# Patient Record
Sex: Male | Born: 1935 | Race: White | Hispanic: No | Marital: Married | Smoking: Former smoker
Health system: Southern US, Community
[De-identification: ages and names within clinical notes are randomized; demographics above are authoritative.]

## PROBLEM LIST (undated history)

## (undated) DIAGNOSIS — I951 Orthostatic hypotension: Secondary | ICD-10-CM

## (undated) DIAGNOSIS — M7550 Bursitis of unspecified shoulder: Secondary | ICD-10-CM

## (undated) DIAGNOSIS — G44009 Cluster headache syndrome, unspecified, not intractable: Secondary | ICD-10-CM

## (undated) DIAGNOSIS — R42 Dizziness and giddiness: Secondary | ICD-10-CM

## (undated) DIAGNOSIS — D649 Anemia, unspecified: Secondary | ICD-10-CM

## (undated) DIAGNOSIS — R32 Unspecified urinary incontinence: Secondary | ICD-10-CM

## (undated) DIAGNOSIS — I515 Myocardial degeneration: Secondary | ICD-10-CM

## (undated) DIAGNOSIS — I2510 Atherosclerotic heart disease of native coronary artery without angina pectoris: Secondary | ICD-10-CM

## (undated) DIAGNOSIS — M1990 Unspecified osteoarthritis, unspecified site: Secondary | ICD-10-CM

## (undated) DIAGNOSIS — I209 Angina pectoris, unspecified: Secondary | ICD-10-CM

## (undated) DIAGNOSIS — R51 Headache: Secondary | ICD-10-CM

## (undated) DIAGNOSIS — Z87440 Personal history of urinary (tract) infections: Secondary | ICD-10-CM

## (undated) DIAGNOSIS — I714 Abdominal aortic aneurysm, without rupture, unspecified: Secondary | ICD-10-CM

## (undated) DIAGNOSIS — I6529 Occlusion and stenosis of unspecified carotid artery: Secondary | ICD-10-CM

## (undated) DIAGNOSIS — H81399 Other peripheral vertigo, unspecified ear: Secondary | ICD-10-CM

## (undated) DIAGNOSIS — I10 Essential (primary) hypertension: Secondary | ICD-10-CM

## (undated) DIAGNOSIS — I639 Cerebral infarction, unspecified: Secondary | ICD-10-CM

## (undated) HISTORY — PX: HERNIA REPAIR: SHX51

## (undated) HISTORY — PX: CAROTID ENDARTERECTOMY: SUR193

## (undated) HISTORY — DX: Occlusion and stenosis of unspecified carotid artery: I65.29

## (undated) HISTORY — PX: FRACTURE SURGERY: SHX138

## (undated) HISTORY — PX: TONSILLECTOMY: SUR1361

## (undated) HISTORY — PX: LIVER BIOPSY: SHX301

---

## 2000-07-29 ENCOUNTER — Encounter: Payer: Self-pay | Admitting: Rheumatology

## 2000-07-29 ENCOUNTER — Encounter: Admission: RE | Admit: 2000-07-29 | Discharge: 2000-07-29 | Payer: Self-pay | Admitting: Rheumatology

## 2000-08-05 ENCOUNTER — Encounter: Payer: Self-pay | Admitting: Rheumatology

## 2000-08-05 ENCOUNTER — Encounter: Admission: RE | Admit: 2000-08-05 | Discharge: 2000-08-05 | Payer: Self-pay | Admitting: Rheumatology

## 2001-04-24 ENCOUNTER — Encounter: Admission: RE | Admit: 2001-04-24 | Discharge: 2001-04-24 | Payer: Self-pay | Admitting: Rheumatology

## 2001-04-24 ENCOUNTER — Encounter: Payer: Self-pay | Admitting: Rheumatology

## 2002-10-20 ENCOUNTER — Encounter: Payer: Self-pay | Admitting: General Surgery

## 2002-10-25 ENCOUNTER — Ambulatory Visit (HOSPITAL_COMMUNITY): Admission: RE | Admit: 2002-10-25 | Discharge: 2002-10-26 | Payer: Self-pay | Admitting: General Surgery

## 2003-01-14 ENCOUNTER — Encounter: Payer: Self-pay | Admitting: Emergency Medicine

## 2003-01-14 ENCOUNTER — Inpatient Hospital Stay (HOSPITAL_COMMUNITY): Admission: EM | Admit: 2003-01-14 | Discharge: 2003-01-15 | Payer: Self-pay | Admitting: Emergency Medicine

## 2004-08-07 ENCOUNTER — Ambulatory Visit: Payer: Self-pay | Admitting: Physical Medicine & Rehabilitation

## 2004-08-07 ENCOUNTER — Inpatient Hospital Stay (HOSPITAL_COMMUNITY): Admission: EM | Admit: 2004-08-07 | Discharge: 2004-08-14 | Payer: Self-pay | Admitting: Emergency Medicine

## 2004-08-10 ENCOUNTER — Encounter (INDEPENDENT_AMBULATORY_CARE_PROVIDER_SITE_OTHER): Payer: Self-pay | Admitting: *Deleted

## 2004-08-14 ENCOUNTER — Inpatient Hospital Stay (HOSPITAL_COMMUNITY)
Admission: RE | Admit: 2004-08-14 | Discharge: 2004-08-24 | Payer: Self-pay | Admitting: Physical Medicine & Rehabilitation

## 2004-08-30 ENCOUNTER — Encounter
Admission: RE | Admit: 2004-08-30 | Discharge: 2004-11-22 | Payer: Self-pay | Admitting: Physical Medicine & Rehabilitation

## 2004-10-05 ENCOUNTER — Encounter
Admission: RE | Admit: 2004-10-05 | Discharge: 2005-01-03 | Payer: Self-pay | Admitting: Physical Medicine & Rehabilitation

## 2004-10-09 ENCOUNTER — Ambulatory Visit: Payer: Self-pay | Admitting: Physical Medicine & Rehabilitation

## 2004-12-25 ENCOUNTER — Inpatient Hospital Stay (HOSPITAL_COMMUNITY): Admission: EM | Admit: 2004-12-25 | Discharge: 2005-01-01 | Payer: Self-pay | Admitting: Emergency Medicine

## 2004-12-25 ENCOUNTER — Ambulatory Visit: Payer: Self-pay | Admitting: Physical Medicine & Rehabilitation

## 2004-12-27 ENCOUNTER — Ambulatory Visit: Payer: Self-pay | Admitting: Cardiovascular Disease

## 2005-01-01 ENCOUNTER — Inpatient Hospital Stay: Admission: RE | Admit: 2005-01-01 | Discharge: 2005-01-09 | Payer: Self-pay | Admitting: *Deleted

## 2005-01-14 ENCOUNTER — Encounter
Admission: RE | Admit: 2005-01-14 | Discharge: 2005-03-18 | Payer: Self-pay | Admitting: Physical Medicine & Rehabilitation

## 2005-02-08 ENCOUNTER — Encounter
Admission: RE | Admit: 2005-02-08 | Discharge: 2005-05-09 | Payer: Self-pay | Admitting: Physical Medicine & Rehabilitation

## 2005-02-08 ENCOUNTER — Ambulatory Visit: Payer: Self-pay | Admitting: Physical Medicine & Rehabilitation

## 2005-03-15 ENCOUNTER — Ambulatory Visit: Payer: Self-pay | Admitting: Physical Medicine & Rehabilitation

## 2005-04-30 ENCOUNTER — Ambulatory Visit (HOSPITAL_COMMUNITY): Admission: RE | Admit: 2005-04-30 | Discharge: 2005-04-30 | Payer: Self-pay | Admitting: *Deleted

## 2006-08-27 ENCOUNTER — Encounter: Admission: RE | Admit: 2006-08-27 | Discharge: 2006-09-24 | Payer: Self-pay | Admitting: Neurology

## 2006-10-02 ENCOUNTER — Ambulatory Visit: Payer: Self-pay | Admitting: *Deleted

## 2006-11-04 ENCOUNTER — Encounter: Admission: RE | Admit: 2006-11-04 | Discharge: 2007-02-02 | Payer: Self-pay | Admitting: *Deleted

## 2007-03-17 ENCOUNTER — Ambulatory Visit: Payer: Self-pay | Admitting: Vascular Surgery

## 2007-03-31 ENCOUNTER — Ambulatory Visit: Payer: Self-pay | Admitting: Vascular Surgery

## 2007-04-06 ENCOUNTER — Encounter: Admission: RE | Admit: 2007-04-06 | Discharge: 2007-07-05 | Payer: Self-pay | Admitting: Neurology

## 2007-07-30 ENCOUNTER — Encounter: Admission: RE | Admit: 2007-07-30 | Discharge: 2007-10-28 | Payer: Self-pay | Admitting: Neurology

## 2007-10-20 ENCOUNTER — Ambulatory Visit: Payer: Self-pay | Admitting: Vascular Surgery

## 2008-10-18 ENCOUNTER — Ambulatory Visit: Payer: Self-pay | Admitting: Vascular Surgery

## 2009-03-21 ENCOUNTER — Encounter: Admission: RE | Admit: 2009-03-21 | Discharge: 2009-05-18 | Payer: Self-pay | Admitting: Orthopaedic Surgery

## 2009-05-02 ENCOUNTER — Encounter: Admission: RE | Admit: 2009-05-02 | Discharge: 2009-05-02 | Payer: Self-pay | Admitting: Neurology

## 2009-05-29 ENCOUNTER — Ambulatory Visit: Payer: Self-pay | Admitting: Vascular Surgery

## 2009-12-22 ENCOUNTER — Ambulatory Visit: Payer: Self-pay | Admitting: Vascular Surgery

## 2010-07-01 ENCOUNTER — Encounter: Payer: Self-pay | Admitting: Neurology

## 2010-07-06 ENCOUNTER — Encounter
Admission: RE | Admit: 2010-07-06 | Discharge: 2010-07-06 | Payer: Self-pay | Source: Home / Self Care | Attending: Rheumatology | Admitting: Rheumatology

## 2010-09-12 ENCOUNTER — Other Ambulatory Visit: Payer: Self-pay | Admitting: Rheumatology

## 2010-09-12 DIAGNOSIS — M25561 Pain in right knee: Secondary | ICD-10-CM

## 2010-09-12 DIAGNOSIS — R609 Edema, unspecified: Secondary | ICD-10-CM

## 2010-09-19 ENCOUNTER — Ambulatory Visit
Admission: RE | Admit: 2010-09-19 | Discharge: 2010-09-19 | Disposition: A | Discharge status: Home / Self Care | Payer: Self-pay | Source: Ambulatory Visit | Attending: Rheumatology | Admitting: Rheumatology

## 2010-09-19 DIAGNOSIS — M25561 Pain in right knee: Secondary | ICD-10-CM

## 2010-09-19 DIAGNOSIS — R609 Edema, unspecified: Secondary | ICD-10-CM

## 2010-10-23 NOTE — Procedures (Signed)
CAROTID DUPLEX EXAM   INDICATION:  Followup carotid disease.   HISTORY:  Diabetes:  No  Cardiac:  No  Hypertension:  Yes  Smoking:  Previous  Previous Surgery:  Right carotid endarterectomy.  CV History:  Previous stroke  Amaurosis Fugax No, Paresthesias No, Hemiparesis Yes                                       RIGHT                LEFT  Brachial systolic pressure:         180                  165  Brachial Doppler waveforms:         Triphasic            Triphasic  Vertebral direction of flow:        Antegrade/diminished.                    Antegrade/diminished.  DUPLEX VELOCITIES (cm/sec)  CCA peak systolic                   73                   58  ECA peak systolic                   140                  124  ICA peak systolic                   49                   Occluded  ICA end diastolic                   17                   Occluded  PLAQUE MORPHOLOGY:                  Intimal thickening   Mixed  PLAQUE AMOUNT:                      Mild                 Severe  PLAQUE LOCATION:                    CCA/ICA/ECA          ICA/ECA   IMPRESSION:  1. Patent right internal carotid artery with no evidence of      restenosis.  2. Right external carotid artery stenosis.  3. Known left internal carotid artery occlusion.        ___________________________________________  Quita Skye Hart Rochester, M.D.   CJ/MEDQ  D:  05/29/2009  T:  05/30/2009  Job:  [id number]

## 2010-10-23 NOTE — Procedures (Signed)
CAROTID DUPLEX EXAM   INDICATION:  Follow up known carotid artery disease.   HISTORY:  Diabetes:  No.  Cardiac:  No.  Hypertension:  Yes.  Smoking:  Quit three years ago (having smoked one pack per day for 50  years).  Previous Surgery:  Right carotid end arterectomy with PDA on 04/19/1984  by Dr. Hart Rochester.  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  Amaurosis Fugax No, Paresthesias No, Hemiparesis Yes, residual from CVA                                       RIGHT             LEFT  Brachial systolic pressure:         130               130  Brachial Doppler waveforms:         Biphasic          Biphasic  Vertebral direction of flow:        Antegrade         Antegrade  DUPLEX VELOCITIES (cm/sec)  CCA peak systolic                   64                81  ECA peak systolic                   75                85  ICA peak systolic                   58                Occluded  ICA end diastolic                   11  PLAQUE MORPHOLOGY:                  Mixed             Calcified  PLAQUE AMOUNT:                      Mild              Large  PLAQUE LOCATION:                    ICA               ICA   IMPRESSION:  1. Known left internal carotid artery occlusion.  2. 20-39% right internal carotid artery stenosis, status post      endarterectomy.  3. Study essentially unchanged from 03/25/2006.   ___________________________________________  [name] [name]. Hart Rochester, M.D.   DP/MEDQ  D:  03/17/2007  T:  03/17/2007  Job:  [id number]

## 2010-10-23 NOTE — Assessment & Plan Note (Signed)
OFFICE VISIT   TENNYSON, WACHA  DOB:  [birth year]                                       10/20/2007  [id number]#:[id number]   The patient returns today for followup regarding his abdominal aortic  aneurysm and his carotid occlusive disease.  He has a remote history of  a left brain stroke and has a known left internal carotid occlusion.  His aneurysm is less than 4 cm in diameter.  He does see Dr. Melbourne Abts  periodically for dizzy spells and falling, and had 1 second stroke in  2006 with the original one being in 1995 at which time he underwent a  right carotid endarterectomy.  Denies any active cardiac symptoms.  He  does have ventricular hypertrophy by echo.   PHYSICAL EXAM:  Blood pressure 184/90, heart rate 74, respirations are  18.  Carotid pulses are 3+.  No bruits are heard.  Neurologic exam  reveals aphasia and right hemiparesis.  Abdomen:  Soft and nontender  with no masses.  He has 3+ femoral pulses bilaterally.   Duplex scan reveals aneurysm to be unchanged less 4 cm in diameter and  his right carotid artery is widely patent with an occlusion of left  internal carotid.   I have reassured him regarding these findings.  He will return in 1 year  for protocol followup of the aneurysm and his carotid disease, unless he  develops any symptoms in the interim.  Please send a copy of that to Dr.  Renae Fickle:  See away she [facility] [phone] breast is 34 of  three lab of a dry as well.  She will let us   Quita Skye. Hart Rochester, M.D.  Electronically Signed   JDL/MEDQ  D:  10/20/2007  T:  10/21/2007  Job:  [id number]   cc:   Dr. Arnell Asal  Evie Lacks, MD

## 2010-10-23 NOTE — Procedures (Signed)
DUPLEX ULTRASOUND OF ABDOMINAL AORTA   INDICATION:  Followup of an abdominal aortic aneurysm.   HISTORY:  Diabetes:  no  Cardiac:  no  Hypertension:  yes  Smoking:  no  Family History:  no  Previous Surgery:  No   DUPLEX EXAM:         AP (cm)                   TRANSVERSE (cm)  Proximal             1.71 cm                   1.77 cm  Mid                  3.87 cm                   4.26 cm  Distal               1.95 cm                   1.95 cm  Right Iliac          1.53 cm                   0.77 cm  Left Iliac           1.30 cm                   0.80 cm   PREVIOUS:  Date: 10/18/2008  AP:  3.8  TRANSVERSE:  4.01   IMPRESSION:  1. Abdominal aortic aneurysm with largest measurement of 3.87 x 4.26      cm.  2. Appears stable with previous study.  3. Mural thrombus is also noted.   ___________________________________________  Quita Skye Hart Rochester, M.D.   CJ/MEDQ  D:  05/29/2009  T:  05/30/2009  Job:  [id number]

## 2010-10-23 NOTE — Procedures (Signed)
DUPLEX ULTRASOUND OF ABDOMINAL AORTA   INDICATION:  Abdominal aortic aneurysm   HISTORY:  Diabetes:  no  Cardiac:  no  Hypertension:  no  Smoking:  no  Connective Tissue Disorder:  no  Family History:  no  Previous Surgery:  No    DUPLEX EXAM:         AP (cm)                   TRANSVERSE (cm)  Proximal             1.8 cm                    1.8 cm  Mid                  3.8 cm                    4.0 cm  Distal               2.6 cm                    2.8 cm  Right Iliac          cm                        1.3 cm  Left Iliac           cm                        1/.7 cm   PREVIOUS:  Date: 05/29/2009  AP:  3.8  TRANSVERSE:  4.2    IMPRESSION:  Stable aneurysmal dilatation of the mid abdominal aorta  noted when compared to the previous exam.  Mural thrombus noted in the  abdominal aorta.          ___________________________________________  Quita Skye Hart Rochester, M.D.   CH/MEDQ  D:  12/25/2009  T:  12/25/2009  Job:  [id number]

## 2010-10-23 NOTE — Procedures (Signed)
CAROTID DUPLEX EXAM   INDICATION:  Followup of known carotid artery disease.   HISTORY:  Diabetes:  No  Cardiac:  No  Hypertension:  Yes  Smoking:  Quit 5 years ago  Previous Surgery:  Right carotid endarterectomy.  CV History:  Yes  Amaurosis Fugax No, Paresthesias No, Hemiparesis Yes                                       RIGHT             LEFT  Brachial systolic pressure:                           154  Brachial Doppler waveforms:                           biphasic  Vertebral direction of flow:        antegrade         antegrade  DUPLEX VELOCITIES (cm/sec)  CCA peak systolic                   84                81  ECA peak systolic                   117               126  ICA peak systolic                   61                occluded  ICA end diastolic                   22                occluded  PLAQUE MORPHOLOGY:                  none              heterogenous  PLAQUE AMOUNT:                      none              severe  PLAQUE LOCATION:                    none              BIF, ICA   IMPRESSION:  1. Patent right internal carotid artery with no evidence of      restenosis.  2. Occluded left internal carotid artery.       ___________________________________________  Quita Skye Hart Rochester, M.D.   AC/MEDQ  D:  10/18/2008  T:  10/18/2008  Job:  [id number]

## 2010-10-23 NOTE — Procedures (Signed)
DUPLEX ULTRASOUND OF ABDOMINAL AORTA   INDICATION:  Follow up abdominal aortic aneurysm.   HISTORY:  Diabetes:  No.  Cardiac:  No.  Hypertension:  Yes.  Smoking:  Quit three years ago (after having smoked one pack per day for  50 years).  Connective Tissue Disorder:  Family History:  Previous Surgery:  Right carotid end arterectomy with DPA on 04/19/1984  by Dr. Hart Rochester.  Patient is also status post right body CVA in 1995 with  residual.   DUPLEX EXAM:         AP (cm)                   TRANSVERSE (cm)  Proximal             1.77 Cm                   2.22 cm  Mid                  3.74 cm                   3.71 cm  Distal               2.46 cm                   2.51 cm  Right Iliac          0.98 cm  Left Iliac           0.95 cm   PREVIOUS:  Date: 03/25/2006  AP:  3.55  TRANSVERSE:  3.85   IMPRESSION:  1. Stable measurements of known abdominal aortic aneurysm.  2. Intimal flap from aortic dissection as noted on previous      examinations in the past.   ___________________________________________  Quita Skye. Hart Rochester, M.D.   DP/MEDQ  D:  03/17/2007  T:  03/17/2007  Job:  [id number]

## 2010-10-23 NOTE — Procedures (Signed)
CAROTID DUPLEX EXAM   INDICATION:  Follow up known carotid artery disease.   HISTORY:  Diabetes:  No.  Cardiac:  No.  Hypertension:  Yes.  Smoking:  Quit 4 years ago.  Previous Surgery:  Right carotid endarterectomy.  CV History:  CVA.  Amaurosis Fugax No, Paresthesias No, Hemiparesis Yes                                       RIGHT             LEFT  Brachial systolic pressure:         CVA               150  Brachial Doppler waveforms:                           Biphasic  Vertebral direction of flow:        Antegrade         Antegrade  DUPLEX VELOCITIES (cm/sec)  CCA peak systolic                   75                62  ECA peak systolic                   106               89  ICA peak systolic                   46                Occluded  ICA end diastolic                   15                Occluded  PLAQUE MORPHOLOGY:                  None              Heterogeneous  PLAQUE AMOUNT:                      None              Severe  PLAQUE LOCATION:                    None              ICA and ECA   IMPRESSION:  1. Occluded left ICA.  2. Normal carotid duplex noted in the right ICA.  3. Status post right carotid endarterectomy.  4. Antegrade bilateral vertebral arteries.   ___________________________________________  Quita Skye Hart Rochester, M.D.   MG/MEDQ  D:  10/20/2007  T:  10/20/2007  Job:  [id number]

## 2010-10-23 NOTE — Procedures (Signed)
CAROTID DUPLEX EXAM   INDICATION:  Follow up carotid disease.   HISTORY:  Diabetes:  no  Cardiac:  no  Hypertension:  no  Smoking:  no  Previous Surgery:  Right carotid endarterectomy.  CV History:  History of CVA x2.  Amaurosis Fugax No, Paresthesias No, Hemiparesis No                                       RIGHT             LEFT  Brachial systolic pressure:         172               162  Brachial Doppler waveforms:         normal  Vertebral direction of flow:        Antegrade  DUPLEX VELOCITIES (cm/sec)  CCA peak systolic                   79  ECA peak systolic                   123  ICA peak systolic                   38  ICA end diastolic                   12.  PLAQUE MORPHOLOGY:                  Heterogenous  PLAQUE AMOUNT:                      Minimal  PLAQUE LOCATION:                    ICA/CCA.   IMPRESSION:  1. Patent right carotid endarterectomy site with no hemodynamically      significant stenosis of the right proximal internal carotid artery.  2. The left carotid artery system was not imaged due to known left      internal carotid artery occlusion.  3. No significant change noted when compared to the previous exam on      05/29/2009.   ___________________________________________  Quita Skye. Hart Rochester, M.D.   CH/MEDQ  D:  12/25/2009  T:  12/25/2009  Job:  [id number]

## 2010-10-23 NOTE — Assessment & Plan Note (Signed)
OFFICE VISIT   [name] [name], [name] [name]  DOB:  [birth year]                                       03/31/2007  ZOXWR#:[id number]   The patient is a patient who has a history of severe carotid occlusive  disease.  I performed a right carotid endarterectomy on him in November  of 1995.  He suffered a left brain stroke in May of 1995 resulting in  aphasia and right hemiparesis, which has persisted over the last 13  years.  He also had a circle of Willis stroke in 2006, managed by Dr.  Sandria Manly, which resulted in bilateral weakness in his lower extremities,  which has resolved.  He has a known small abdominal aortic aneurysm,  which has measured less than 4 cm in diameter and has been checked  annually.  He denies any new neurologic symptoms involving the right  hemisphere, consisting of any left-sided weakness or visual changes.   PAST MEDICAL HISTORY:  Significant for hypertension, hyperlipidemia, but  negative for coronary artery disease or COPD.   SOCIAL HISTORY:  He is married.  Does not use tobacco.  Has not since  2004.  Does not use alcohol.   PHYSICAL EXAM:  Blood pressure 140/80, heart rate 84, respirations 20.  In general, he is an elderly male with a left hemiparesis and aphasia.  His carotid pulses are 3+ with no audible bruits.  Neurologic exam  reveals the expressive aphasia and right-sided weakness.  Neck is  supple.  Chest is clear to auscultation.  Abdomen is soft.  Small  pulsatile mass measuring less than 4 cm in diameter.  He has 3+ femoral  pulses bilaterally.  Well-perfused lower extremities.   Carotid duplex today reveals a widely patent right internal carotid  artery and occluded left internal carotid artery.  The aneurysm is  unchanged, measuring approximately 3.8 cm maximum diameter.   In general, his status is stable.  We will see him on an annual basis in  our office to monitor his carotid disease and his aneurysm.  If he has  any new  symptoms, he will be in touch with me.   Quita Skye Hart Rochester, M.D.  Electronically Signed   JDL/MEDQ  D:  03/31/2007  T:  04/01/2007  Job:  [id number]   cc:   Evie Lacks, MD  Dr. Hyacinth Meeker

## 2010-10-23 NOTE — Procedures (Signed)
DUPLEX ULTRASOUND OF ABDOMINAL AORTA   INDICATION:  Followup of known abdominal aortic aneurysm.   HISTORY:  Diabetes:  No  Cardiac:  No  Hypertension:  Yes  Smoking:  No  Connective Tissue Disorder:  No  Family History:  No  Previous Surgery:  No   DUPLEX EXAM:         AP (cm)                   TRANSVERSE (cm)  Proximal             2.25 cm                   2.3 cm  Mid                  3.80 cm                   4.01 cm  Distal               2.72 cm                   2.71 cm  Right Iliac          1.20 cm                   1.59 cm  Left Iliac           1.10 cm                   1.26 cm   PREVIOUS:  Date: 10/20/2007  AP:  3.61  TRANSVERSE:  3.78   IMPRESSION:  Slight increase in abdominal aortic aneurysm since last  done on 10/20/2007.   ___________________________________________  Quita Skye. Hart Rochester, M.D.   AC/MEDQ  D:  10/18/2008  T:  10/18/2008  Job:  [id number]

## 2010-10-23 NOTE — Procedures (Signed)
DUPLEX ULTRASOUND OF ABDOMINAL AORTA   INDICATION:  Followup of abdominal aortic aneurysm.   HISTORY:  Diabetes:  No.  Cardiac:  No.  Hypertension:  Yes.  Smoking:  Quit about 4 years ago.  Connective Tissue Disorder:  Family History:  Previous Surgery:   DUPLEX EXAM:         AP (cm)                   TRANSVERSE (cm)  Proximal             2.03 cm                   2.21 cm  Mid                  3.61 cm                   3.78 cm  Distal               2.71 cm                   2.45 cm  Right Iliac          1.44 cm                   1.59 cm  Left Iliac           1.47 cm                   1.47 cm   PREVIOUS:  Date:  AP:  3.74  TRANSVERSE:  3.71   IMPRESSION:  Abdominal aortic aneurysm noted with the largest  measurement of 3.61 cm x 3.78 cm.   ___________________________________________  Quita Skye Hart Rochester, M.D.   MG/MEDQ  D:  10/22/2007  T:  10/22/2007  Job:  [id number]

## 2010-10-26 NOTE — Consult Note (Signed)
NAME:  [name], [name] NO.:  [id number]   MEDICAL RECORD NO.:  [id number]          PATIENT TYPE:  EMS   LOCATION:  [facility]                         FACILITY:  [facility]   PHYSICIAN:  Marlan Palau, M.D.  DATE OF BIRTH:  10-24-1934   DATE OF CONSULTATION:  08/07/2004  DATE OF DISCHARGE:                                   CONSULTATION   NEUROLOGY EMERGENCY ROOM CONSULT NOTE   HISTORY OF PRESENT ILLNESS:  [name] [name] is a 76 year old right-handed  white male born 1934/06/30 with a history of hypertension and  cerebrovascular disease.  This patient has sustained a prior left frontal  cerebrovascular infarct with chronic right hemiparesis and aphasia syndrome.  Patient comes to the emergency room today after getting up out of bed and  going to the bathroom to have a bowel movement.  Patient began noting  abdominal pain at that point, started feeling dizzy, weak all over.  Patient  denies any nausea or vomiting.  Patient did have a bowel movement at this  point.  Patient, however, felt so weak he could not walk effectively.  Called his brother who came down and brought him to the emergency room.  Patient denies any new numbness or weakness in a focal nature in the face,  arms, or legs.  Denies any visual field disturbance, headache.  Again, notes  that he feels dizzy and generally weak.  Neurology was called for further  evaluation as a code stroke.  CT of the head shows an old left frontal  cerebrovascular infarct.  No acute changes were seen.   PAST MEDICAL HISTORY:  1.  Episode of abdominal pain, dizziness, generalized weakness, and near      syncope as above.  2.  History of an old left frontal cerebrovascular infarction.  3.  Status post left carotid endarterectomy.  4.  Abdominal aortic aneurysm followed conservatively.  5.  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  6.  Hypertension.  7.  Hypercholesterolemia.   CURRENT MEDICATIONS:  1.  Lescol 40 mg  daily.  2.  Aspirin 81 mg daily.  3.  Hydrochlorothiazide 12.5 mg daily.  4.  Relafen 750 mg one b.i.d. if needed.  5.  Vitamin C 500 mg daily.  6.  Vitamin D and calcium one b.i.d. 600 mg tablet.  7.  Tylenol PM if needed.  8.  Multivitamins if needed.  9.  B complex vitamins.  10. Citrucel.   ALLERGIES:  Patient states allergy to TAPE ADHESIVE.   HABITS:  Does not currently smoke or drink.   SOCIAL HISTORY:  This patient is married.  [place]  Splits his time between here and [facility].  Patient has one daughter who is alive and well.  Patient is retired.   FAMILY HISTORY:  Father died age 81 with an MI.  Mother died age 17 with an  MI.  Patient has a total of 14 brothers and sisters.  One died with  leukemia.  The rest are alive and well.  One sister has  diabetes, still  living.   REVIEW OF SYSTEMS:  Notable for no fevers, chills.  Denies headache.  Denies  neck pain.  Denies shortness of breath, chest pain, palpitations.  Does note  abdominal pain.  Denies any problems controlling the bowels or the bladder.  Does have a chronic right hemiparesis.  No new focal weakness noted.  Feels  weak in general and has not had any syncope, blackout today, but felt as if  he might blackout.   PHYSICAL EXAMINATION:  VITAL SIGNS:  Blood pressure 190/96, heart rate 88,  respiratory rate 18, temperature afebrile.  GENERAL:  This patient is a fairly well-developed, elderly white male who is  alert, cooperative, aphasic at the time of examination.  HEENT:  Head is atraumatic.  Eyes:  Pupils are equal, round, and reactive to  light.  Disks are flat bilaterally.  NECK:  Supple.  No carotid bruits noted.  RESPIRATORY:  Clear.  CARDIOVASCULAR:  Regular rate and rhythm.  No obvious murmurs or rubs noted.  EXTREMITIES:  Without significant edema.  NEUROLOGIC:  Cranial nerves as above.  Facial symmetry is relatively intact.  Patient notes  fairly good symmetry to pin prick sensation on the face.  Has  full extraocular movements.  Visual fields are full to double simultaneous  stimulation.  Patient has a nonfluent aphasia.  Understands speech well.  Motor testing reveals a very mild weakness of the right arm, right legs.  Increased tone noted on the right side.  Patient has ability to perform  finger-nose-finger bilaterally and toe-to-finger bilaterally.  Patient has  _goood__ symmetry to deep tendon reflexes.  Toes are neutral bilaterally.  No drift is seen.  Pin prick sensation is slightly depressed on the right  arm and right leg as compared to the left.  Vibratory sensation slightly  depressed on the right arm and right leg as compared to the left.  Patient  was not ambulated.   LABORATORIES:  White count 9.1, hemoglobin 15.6, hematocrit 43.7, MCV 88.0,  platelets 198.  INR 0.8.  Sodium 140, potassium 4.1, chloride 108, CO2 26,  glucose 110, BUN 13, creatinine 1.1, potassium 9.2.  Total protein 6.8,  albumin 4.0, AST 29, ALT 34, alkaline phosphatase 51, total bilirubin 0.8.   CT of the head is as above.   IMPRESSION:  1.  New onset of dizziness, sensation of generalized weakness, near syncope,      abdominal pain.  2.  History of old left frontal stroke with chronic right hemiparesis and      aphasia syndrome.  3.  Hypertension.   This patient is at his baseline neurologic status.  Has generalized  symptomatology that began with abdominal pain.  Cause of this is not clear.  Need to rule out diverticulitis versus small abdominal aortic aneurysm leak  versus other abdominal process.  Patient has nonsurgical abdomen at this  point.  At any rate, there is no evidence of any new focal neurologic  disease.  Doubt that the current symptom complex is primary neurologic  event.   PLAN:  Will sign off this case at this point.  Will be happy to see this patient again at any point.  No further neurologic work-up is  recommended at  this time.  Would keep patient on low dose aspirin.      CKW/MEDQ  D:  08/07/2004  T:  08/07/2004  Job:  [id number]

## 2010-10-26 NOTE — H&P (Signed)
[name] [name], [name] [name] NO.:  [id number]   MEDICAL RECORD NO.:  [id number]          PATIENT TYPE:  INP   LOCATION:  6529                         FACILITY:  [facility]   PHYSICIAN:  Lonia Blood, M.D.      DATE OF BIRTH:  [birth year]   DATE OF ADMISSION:  12/24/2004  DATE OF DISCHARGE:                                HISTORY & PHYSICAL   PRIMARY CARE PHYSICIAN:  Medical laboratory scientific officer.   CHIEF COMPLAINT:  Falls.   HISTORY OF PRESENT ILLNESS:  This is a 76 year old gentleman with history of  multiple CVA's before as well as cluster headaches and arthritis.  The  patient was in the hospital back in March with some CVA.  He has been doing  pretty well today and went to his brother's place with his wife and spent a  long time chatting.  On the way back, he drove to their own house.  Per  wife, she went in ahead of the patient while he parked the car.  While in  the kitchen she heard a loud noise, came out, and found the husband in front  on their house lying on his back with blood coming out of his head.  She  quickly called EMS while asking him to stay there.  He was just staring at  her.  She had no idea what happened and the patient had no recollection of  events during that brief period.  He is currently awake and alert, and back  to his baseline, but cannot remember what happened exactly.  The patient's  wife did not notice any loss of bowel or bladder function, no jerking  movement.  This is the first time that the patient has fallen.  He otherwise  denied any symptoms prior to the event.  No fever, no nausea, no vomiting,  no diarrhea recently.  The patient apparently has baseline orthostasis all  the time.  Per wife, his blood pressure usually drops 20 to 30 mmHg between  lying and standing.  He has since learned to accommodate that drop by  standing still for a couple of minutes after getting up initially.  He is  being followed by Genene Churn. Love, M.D., his  neurologist.   PAST MEDICAL HISTORY:  Multiple CVA's with severe vasculopathic nature.  He  has had multiple MRI's before, most recent is an acute infarct in the right  corona radiator and posteriorly of the internal capsule.  The left ICA was  completely occluded.  On the right, the patient has had carotid  endarterectomy already.  His first stroke was in 1995.  He has had multiple  right hand surgeries with second finger amputation on the right due to  arthritis.  History of cluster headaches, history of nephrolithiasis.  Cervical HNP with right shoulder limitation.  AAA history.  Last checked it  was 3.8 cm.  History of left total hip replacement and decreased hearing.  Left shoulder cervical bursitis.  History of chronic orthostasis and  dizziness.   MEDICATIONS:  1.  Aggrenox one tablet b.i.d.  2.  Multiple B6 vitamin  one tablet daily.  3.  Lescol 40 mg q.h.s.  4.  Os-Cal plus D 500 mg p.o. b.i.d.  5.  Citrucel one tablespoon in 8 ounces of water b.i.d.  6.  Metamucil p.r.n.  7.  The patient also takes hydrochlorothiazide 12.5 mg p.r.n. when systolic      blood pressure is high.   ALLERGIES:  No known drug allergies.   SOCIAL HISTORY:  The patient is married and lives here in [place] in a  one-level home.  He spends time here and apparently has another house in  [place].  He quit tobacco about 3 years ago.  No alcohol intake.  He  has been able to do all of his ADL's and even drive around without problem.  He is married to a nurse who is very concerned with his health and she takes  care of his medical problems very closely.  The patient rarely drinks  alcohol.   FAMILY HISTORY:  Significant for diabetes, coronary artery disease.   REVIEW OF SYSTEMS:  The patient complains of generalized headache in the  back mainly.  Dizziness with any standing or sitting.  Otherwise, 10-point  review of systems is as in HPI.   PHYSICAL EXAMINATION:  VITAL SIGNS:  Temperature  98.4, blood pressure 185/83  sitting with a pulse of 72 and respiratory rate 20.  Saturations 95% on room  air.  GENERAL:  He alert and oriented and hard of hearing, otherwise in no acute  distress.  HEENT:  The patient has small laceration at the back of his skull which is  dry and no longer bleeding.  It looks superficial.  His pupils equal, round,  and reactive to light.  No facial droop.  NECK:  Supple, no JVD, no lymphadenopathy.  Mild carotid bruit on the right.  LUNGS:  Good air entry bilaterally with no wheezes or rales.  CARDIOVASCULAR:  Regular rate and rhythm.  ABDOMEN:  Obese, full, nontender, positive bowel sounds.  EXTREMITIES:  No cyanosis, clubbing, or edema.  NEUROLOGY:  Limited, however, the patient's Cranial nerves II-XII grossly  intact except for the decreased hearing.  His motor examination showed 5/5  in the lower extremities bilaterally as well as the left upper extremity.  The right upper extremity is slightly weak.  The patient's coordination is  poor.  He was unable to maintain a gait, unable to stand on his feet.  His  finger-to-nose coordination seemed to better especially with the left.  With  the right hand it was poor.  Reflexes seem to be 2+ bilaterally upper and  lower extremities, respectively.  The patient had no noticeable nystagmus,  although, there is lateral lag it appears.  Romberg sign could not be  determined as the patient could not even stand on his feet.   LABORATORY DATA:  White count of 14.0, hemoglobin 14.9, platelet count 242  with a left shift, ANC 12.2.  UA essentially negative.  PT 15.1, INR 1.2.  Electrolytes are currently pending.   Head CT without contrast showed no active disease.   EKG showed normal sinus rhythm with no ST T wave changes and normal  intervals, a change from previous.   ASSESSMENT:  This is a 76 year old gentleman with history of CVA's in the past, presenting with a syncopal episode.  The syncopal episode led  to a  fall of the patient and some concussion when he came into the emergency  room.  The patient also had laceration of his  skull process posteriorly  which does not require any suture.  The most likely cause of the patient's  fall is syncopal episode due to either posterior cerebral circulation CVA.  It could also be some arrhythmia which is less likely especially with no  history of cardiac disease.  It could also be due to the patient's chronic  orthostasis as mentioned, however, he has had this for years and did not  have any problem.   PLAN:  1.  We will work the patient up for syncopal episode, especially rule him      out for another CVA which is most likely even though he is on Aggrenox.      I will proceed with second MRI and MRA of the brain to check his      posterior circulation.  If this is confirmed, I will consult neurologist      to see if we may have to make further changes to the patient's current      medications including possibly putting him on Coumadin.  In the      meantime, I will get PT/OT involved and the patient is already back to      his baseline.  2.  Orthostasis and dizziness.  There may be some elements of dehydration      here, although, the patient had moist mucous membranes.  I will hydrate      the patient gently.  The patient may require follow-up evaluation at      this time around including maybe status and flow length to help      stabilize his gait.  3.  History of multiple CVA's.  I will continue with Aggrenox and further      management as #1 above.  4.  Hypertension.  The patient's blood pressure apparently is extremely      labile.  Need to assess the patient of CVA at this point.  I will not      treat his relatively high blood pressure.  I will wait until after the      MRI.  5.  History of arthritis.  This seems to be stable at this point.  6.  Constipation.  I will keep the patient on his home regimen of Citrucel      and MiraLax as  needed.  7.  Prophylaxis.  I will initiate both Protonix and Lovenox for DVT      prophylaxis as well as GI prophylaxis.       LG/MEDQ  D:  12/25/2004  T:  12/25/2004  Job:  [id number]

## 2010-10-26 NOTE — H&P (Signed)
NAME:  [name] [name], [name] [name] NO.:  [id number]   MEDICAL RECORD NO.:  [id number]                   PATIENT TYPE:  INP   LOCATION:  3029                                 FACILITY:  [facility]   PHYSICIAN:  Hettie Holstein, D.O.                 DATE OF BIRTH:  1934-12-08   DATE OF ADMISSION:  01/13/2003  DATE OF DISCHARGE:                                HISTORY & PHYSICAL   PRIMARY CARE PHYSICIAN:  Quita Skye. Hart Rochester, M.D.   CHIEF COMPLAINT:  Abdominal pain, dizziness and nausea.   HISTORY OF PRESENT ILLNESS:  This is a 76 year old male with history of CVA  with right-sided weakness and dysphagia who presents with complaint of acute  onset of dizziness and some right lower discomfort with nausea without  emesis which occurred approximately 5:30 a.m. on January 13, 2003.  He reports  that he came inside his home and sat down in his recliner.  While he was in  his recliner, he tried to get up and felt dizzy.  He broke out into a cold  sweat and started having episodes of gagging and proceeded to call his  girlfriend.  He notified his brother who eventually came to his house and  found him to be diaphoretic, gagging and dizzy.  He presented to the  emergency department and underwent evaluation including a CT scan of his  abdomen which revealed a 3.9 cm aorta which was reported by the radiologist  to have questionable focal area of dissection just above the bifurcation.  Vascular surgery, Dr. Darrick Penna, evaluated the patient and felt that his  findings on CT were not acute and he could be followed up with ultrasound of  his abdominal aortic aneurysm in one year.  He subsequently was evaluated by  myself in the emergency department and found to be quite orthostatic despite  fluids and he is now being admitted for rehydration and further workup.   PAST MEDICAL HISTORY:  1. Cerebrovascular accident in 1997, that has left him with a right     hemiparesis as well as chronic  aphasia.  2. History of hypertension.  3. History of hypercholesterolemia, status post left inguinal hernia repair     in May 2004.  4. Carotid thrombus with carotid endarterectomy.  5. Denies history of diabetes and coronary artery disease.  6. 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     The patient has questionable underlying bone disorder, although he is not     exactly sure of the nature of this diagnosis.  Most of the history is     obtained from his brother secondary to the patient being aphasic.   PAST SURGICAL HISTORY:  1. Carotid endarterectomy.  2. Left inguinal hernia repair.   MEDICATIONS:  1. Lescol 40 mg p.o. q.d.  2. Aspirin 81 mg q.d.  3. HCTZ 12.5 mg p.o. q.d.  4. Tarka  2/240 1/2 tablet p.o. q.d.  5. Relafen 750 mg p.o. q.d.   ALLERGIES:  TAPE.   SOCIAL HISTORY:  The patient quit smoking last year.  On and off smoker for  may years ago.  He drinks occasional alcohol with a couple of glasses of  wine per week.  He is a former retired Dentist.  He resides in  [facility] part of the year and this area another part of the year.  He  currently has a lady friend who is a Engineer, civil (consulting) who lives with him at present.   FAMILY HISTORY:  The patient has a father who is deceased and who had  hypertension and MI.  Mother is deceased and has a history of hypertension  and MI.  She has seven brothers living, one deceased from leukemia.  Other  siblings have been noted to have melanoma, hypertension, arthritis, renal  cancer.  His brother with him today is named Scientist, research (medical).   REVIEW OF SYMPTOMS:  GENERAL:  The patient denies any malaise, fatigue,  anorexia, weight loss.  HEENT:  Denies any new or worsening vision changes  or hearing problems.  He denies tinnitus.  He denies deafness.  Denies upper  respiratory complaint.  Denies pharyngitis.  CARDIOVASCULAR:  The patient  denies history of MI, denies heart problems that he is aware of.  He  denies  angina and denies exertional dyspnea or orthopnea.  PULMONARY:  The patient  denies hemoptysis, denies cough, denies wheeze.  As above, he has recently  stopped smoking.  GASTROINTESTINAL:  The patient reports nausea only with  this current episode as noted in the history of present illness.  No  hematochezia, melena or hematemesis.  MUSCULOSKELETAL:  As above, the  patient takes NSAID for arthritis.  He is status post amputation of his  right index finger.  He currently denies any increasing joint pain, joint  swelling or joint aches.  ENDOCRINE:  The patient does report some polyuria,  but denies polydipsia or polyphagia.  Reports sweats only with his current  episode.  NEUROLOGIC:  The patient denies history of depression, although he  has weakness in right arm and leg with numbness in his face, all on the  right side.  He does use a brace to walk at home, however, he is able to  ambulate.   PHYSICAL EXAMINATION:  VITAL SIGNS:  The patient's orthostatic vital signs  were taken in the emergency department.  Supine heart rate was 65, seated 76  and standing 90.  Completion of orthostatic vitals cannot be completed  secondary to the patient's profound symptoms of orthostasis while in the  emergency department upon assessment of standing blood pressure.  His seated  blood pressure, however, dropped to 120/78 while his supine blood pressure  was 150/86.  His temperature was 98.1, O2 saturations on room air 95%,  respirations 20.  GENERAL:  The patient was nontoxic in appearance.  He appeared comfortable  in no acute distress except when sitting up or standing.  His speech was  difficult to understand, however, he could communicate appropriately and  respond to questions appropriately.  HEENT:  Head was normocephalic, atraumatic.  Extraocular movements intact.  Pupils equal round and reactive to light and accommodation.  Nares patent. Nasal mucosa pink and moist.  Throat was clear  without exudate.  NECK:  Supple, nontender, no palpable thyromegaly or tracheal deviation.  CARDIAC:  S1, S2 without audible S3, S4.  No murmurs were appreciated at  this time.  LUNGS:  Clear to auscultation bilaterally without wheeze or rales.  Symmetrical extension bilaterally.  No dullness to percussion.  ABDOMEN:  Surgical wound incision with mild tenderness surrounding, but no  evidence of dehiscence or infection.  The patient exhibits some mild  suprapubic or bladder discomfort on palpation.  The bladder was felt to be  mildly distended.  There was no right upper quadrant tenderness, no right  lower quadrant tenderness, no palpable mass and normoactive bowel sounds.  RECTAL:  Performed by the ED physician and results were deemed negative.  Hemoccult negative stool.  EXTREMITIES:  Nonedematous.  Peripheral pulses were palpable and symmetrical  bilaterally.  He was status post right index finger amputation at the MP  joint.  NEUROLOGIC:  Strength was diminished on the right side of upper and lower  extremities, +3-4/5 with respect to upper extremity flexors and extensors as  well as lower extremity flexors and extensors.  Left-sided with +5/5  strength in upper and lower extremities.  SKIN:  Appeared to be dry with mild degree of tenting.   LABORATORY DATA AND X-RAY FINDINGS:  Sodium 140, potassium 4.5, chloride  107, CO2 24, BUN 17, creatinine 1.1, glucose 122.  LFTs were all within  normal limits.  Hemoglobin of 14.1 and hematocrit 40.6, WBC 12.1, platelets  220.  UA was unremarkable and negative.   EKG is ordered and is pending at this time.  CT scan of the abdomen was  performed and reviewed by vascular surgery with the finding of a 3.0 cm  abdominal aortic aneurysm resolved.  No evidence of appendicitis.   ASSESSMENT/PLAN:  1. Orthostatic hypotension, expect volume depletion with chronic use of     diuretics and inadequate oral intake.  We will admit Mr. Whitford to a      regular nursing floor and provide intravenous hydration with normal     saline.  We will hold his diuretic at this time.  Repeat chemistry panel     this afternoon.  Reassess his orthostatics each shift.  Attempt to     ambulate Mr. Kruse.  2. Hypertension.  At this time, we will continue Mr. Colan Laymon,     however, we will hold his diuretic.  3. Abdominal aortic aneurysm.  He was seen by vascular surgery.  Will plan     to recommend repeat ultrasound and evaluation in one year.                                                Hettie Holstein, D.O.    ESS/MEDQ  D:  01/14/2003  T:  01/14/2003  Job:  [id number]

## 2010-10-26 NOTE — H&P (Signed)
NAMEALTIN, SEASE NO.:  [id number]   MEDICAL RECORD NO.:  [id number]          PATIENT TYPE:  EMS   LOCATION:  MAJO                         FACILITY:  [facility]   PHYSICIAN:  Maxwell Caul, M.D.DATE OF BIRTH:  [birth year]   DATE OF ADMISSION:  08/07/2004  DATE OF DISCHARGE:                                HISTORY & PHYSICAL   IDENTIFICATION:  This is a 76 year old man from home, under the primary care  of Dr. Karlene Einstein.   CHIEF COMPLAINT:  Abdominal pain/presyncope.   HISTORY OF PRESENT ILLNESS:  This is a patient with a prior history of left  parietal CVA. He nevertheless manages at home reasonably well, ambulates  independently, reasonably independent with ADLs, and functional.   The patient was in the bathroom today when he developed poorly localized  abdominal pain. He felt weak and presyncopal. When his sister arrived he was  sitting on the couch too weak to stand. There was no nausea, vomiting,  melena, or hematochezia.   In the ER he was initially felt to be a code stroke, however it was  cleared by Dr. Anne Hahn (has an old stroke).  A CT scan of the abdomen was  done because the abdominal pain suggested a change in his known 3.7 cm  abdominal aortic aneurysm. This has been reviewed by Dr. Arbie Cookey who does not  feel this is contributing or changed. I was called to review him. I was  about to DC him home when he stood up, became weak, and could not walk. He  denies new focal weakness sensation, change in sensation headache, or back  pain. The wife states he is occasionally orthostatic with blood pressures in  the 80s when he stands. Apparently this led to stopping some of his  antihypertensive  medications a year or so ago.   MEDICATIONS ON ADMISSION:  1.  Lescol 40 mg p.o. daily.  2.  ASA 81 p.o. daily.  3.  Hydrochlorothiazide 12.5 daily.  4.  Relafen 750 b.i.d. p.r.n.  5.  Calcium 600 plus D one p.o. b.i.d.  6.  Tylenol PM one p.o.  q.h.s.   PAST MEDICAL HISTORY:  1.  He has a known large left frontal CVA with spastic right upper      extremity. This is known previously.  2.  He was in the office on July 26, 2004, with drops of blood on his      stool. He was OB negative, however there was felt to be some      irregularity at the tip of the examining finger. He had been referred to      Dr. Virginia Rochester.  3.  Hyperlipidemia.  4.  History of rheumatoid arthritis, on Relafen. He was tapered off his      steroids in early 2005.  5.  Hypertension with orthostatic hypotension.  6.  History of nephrolithiasis.  7.  History of a known small AAA.  8.  Root impingement in his neck.  9.  Spur in the right surgery.  10. Hand surgery for rheumatoid arthritis.  11. Inguinal herniorrhaphy.  12.  History of tonsillectomy.   PHYSICAL EXAMINATION:  GENERAL: His vitals are stable. He appears very  stable.  HEENT: Normal. He appears well hydrated.  RESPIRATORY EXAM:  Clear.  LYMPHS: None palpable.  CVS: His heart sounds are normal. There are no murmurs, no bruits.  ABDOMEN: His bowel sounds are positive. He  is diffusely touchy, but no  liver, no spleen. He has no guarding, no rebound.  RECTAL EXAM: Per EDP is OB negative.  CNS: His cranial nerves seem intact. Motor exam reveals he has a spastic  right hand, but he is able to move his upper extremities with good power. He  has slight right-sided hyperreflexia. None of this seems particularly new.   Laboratory work today reveals white count of 9.1, hemoglobin 15.6, platelet  count 198,000. His INR is 0.9. Sodium 140, K 4.1, chloride 108, CO2 26, BUN  13, creatinine 1.1. His LFTs are  normal. His EKG is normal. His urinalysis  is benign albeit his stools are obese negative. CT scan shows left frontal  CVA with old infarcts in the right periventricular white male. CT scan of  the abdomen apparently shows an old abdominal aortic aneurysm which is  unchanged from her previous scan in  2004.  This has been reviewed by Dr.  Arbie Cookey, and I have not seen this report.   IMPRESSION:  1.  Abdominal pain. His laboratories and CT scan of the abdomen are      apparently not suggestive of specific etiology. His exam is reasonably      benign. His urinalysis does not show blood suggesting this is not active      nephrolithiasis. I do not believe there is an active infection here.      Will observe in hospital. If he develops further symptomatology,      consider MRI or MRA of the (intestinal ischemia, etc).  2.  Weakness with inability to walk. There is no evidence of a new      cerebrovascular accident.  He is orthostatic, but not usually      symptomatic from this.  3.  Orthostatic hypertension. Apparently this is not a few finding for this      man, however his symptomatology is new. There is no clear CNS or      peripheral nervous system cause. He is not dehydrated and is not anemic.      I will a fasting cortisol tomorrow. Previously, he has been on      prednisone and would wonder about steroid withdrawal/adrenal      insufficiency.  4.  Recent review of the office of bright red blood on stool. I had referred      him to Dr. Virginia Rochester. He is currently OB negative and not anemic.   PLAN:  I have no choice but to admit him. His wife cannot look after him if  he is not ambulatory. He will need a PT-OT consult, monitoring of his  orthostatic blood pressures, electrolytes, and hemoglobin. No other  investigations are ordered at this time pending review of further  symptomatology in the hospital.      MGR/MEDQ  D:  08/07/2004  T:  08/07/2004  Job:  [id number]

## 2010-10-26 NOTE — Op Note (Signed)
NAME:  [name] [name], [name] [name]                       ACCOUNT NO.:  [id number]   MEDICAL RECORD NO.:  [id number]                   PATIENT TYPE:  OIB   LOCATION:  NA                                   FACILITY:  [facility]   PHYSICIAN:  Angelia Mould. Derrell Lolling, M.D.             DATE OF BIRTH:  [birth year]   DATE OF PROCEDURE:  10/25/2002  DATE OF DISCHARGE:                                 OPERATIVE REPORT   PREOPERATIVE DIAGNOSIS:  Left inguinal hernia.   POSTOPERATIVE DIAGNOSIS:  Left inguinal hernia.   PROCEDURE:  Repair of left inguinal hernia with mesh Armanda Heritage repair).   SURGEON:  Angelia Mould. Derrell Lolling, M.D.   OPERATIVE INDICATION:  This is a 76 year old white man who noticed a left  inguinal hernia for several months.  He has occasional pain but no history  of incarceration.  On exam he has a moderate-sized left inguinal hernia  which is reducible.  There is no evidence of hernia on the right.  Because  of his discomfort, he is brought to the operating room for repair.   DESCRIPTION OF PROCEDURE:  The patient was placed supine on the operating  table.  He was monitored and sedated by the anesthesia department.  The left  groin was prepped and draped in a sterile fashion.  Xylocaine 1% with  epinephrine was used as a local infiltration anesthetic.  A slightly oblique  incision was made overlying the left inguinal canal.  Dissection was carried  down through the subcutaneous tissue, exposing the aponeurosis of the  external oblique.  The external oblique was incised in the direction of its  fibers, opening up the external inguinal ring.  A couple of tiny sensory  nerves were encountered.  They were clamped at the level of the internal  ring, divided, and ligated with 2-0 silk ties.  The cord structures were  mobilized and encircled with a Penrose drain.  I dissected out what turned  out to be a very large direct hernia.  This was simply reduced and the soft  tissues oversewn with a running  suture of 2-0 Vicryl to hold this in place.  The cord structures were then examined carefully, and I did not find any  evidence of an indirect hernia.  The floor of the inguinal canal was  repaired and reinforced with an onlay graft of polypropylene mesh.  A 3 x 6  inch piece of mesh was used.  This was trimmed slightly to fit the tissue  shape and sutured in place with running sutures of 2-0 Prolene.  The mesh  was sutured so as to generously overlap the fascia at the pubic tubercle,  along the inguinal ligament inferiorly, then along the internal oblique and  conjoined tendon superiorly.  The mesh was incised laterally so as to wrap  around the cord structures of the internal ring.  The tails of the mesh were  overlapped laterally and the  suture lines completed.  This provided a very  secure repair both medial and lateral to the internal ring but allowed an  adequate opening for the cord structures.  Hemostasis was excellent.  The  wound was irrigated with saline.  The external oblique was closed with a  running suture of 2-0 Vicryl, placing the cord structures deep to the  external oblique.  Scarpa's fascia was closed  with 3-0 Vicryl sutures and the skin closed with running subcuticular suture  of 4-0 Vicryl and Steri-Strips.  Clean bandages were placed and the patient  taken to the recovery room in stable condition.  Estimated blood loss was  about 10 mL.  Complications:  None.  Sponge, needle, and instrument counts  were correct.                                               Angelia Mould. Derrell Lolling, M.D.    HMI/MEDQ  D:  10/25/2002  T:  10/25/2002  Job:  [id number]   cc:   Genene Churn. Love, M.D.  [address]  [address]  [place]  [place] [address]  Fax: [phone]   (please look up correct address) Arnell Asal, MD, [place], [place]

## 2010-10-26 NOTE — Assessment & Plan Note (Signed)
MEDICAL RECORD NUMBER:  [id number].   Mr. Strothman returns today. He has a history of left MCA stroke about 10  years ago and had a smaller right subcortical infarct causing mild left  sided weakness. He went through rehab from March 7 to August 24, 2004 as an  inpatient at [facility]. He has had problems with his gait as well as upper  extremity coordination; however, his left upper extremity has improved, and  he has been discharged from occupational therapy. He is now getting just  some outpatient physical therapy at [facility] location.   He has not been driving. He has been independent with all of his self-care,  living with his wife. They would like to go back and forth [place]  [place] once he is done with physical therapy which is scheduled for  only about two more weeks.   REVIEW OF SYSTEMS:  Review of systems has been reviewed. No pertinent  positives.   PHYSICAL EXAMINATION:  Blood pressure 139/84, pulse 76, respiratory rate 20,  O2 saturation 95% on room air.   Speech remains aphasic but is receptively intact, is able to answer in  single word and short phrase level. 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 His hand strength grip wise is 4 bilaterally, 4  bilateral deltoid/biceps/triceps, 4 at bilateral lower extremities except  for 3- at the right ankle with some increased tone.   Gait is using a right AFO. No evidence of toe drag or knee instability. No  assistive device.   Visual fields are intact to confrontation testing. Extraocular muscles are  intact. He has intact toe tapping on the left side and good full shoulder  range of motion on the left.   IMPRESSION:  1.  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  2.  More recent right internal capsule infarct with minimal residual. His      gait is a bit less steady than before but certainly  improving well.   PLAN:  1.  Continue physical therapy for another two to three weeks and then      continue with home-exercise program.  2.  I think it is safe for him to return to driving. He does have a spinner      knob as well as left foot accelerator. I recommended that he go out with      his wife in an empty parking lot to start and then proceed on to empty      streets. [place]  3.  The patient has followup with Dr. Sandria Manly of neurology for six-month      recheck.  4.  Follow up with primary care physician at [facility].  5.  The patient's wife did ask about [facility], whether some      kind of surgery could improve his deficits and basically cerebrovascular      surgery would not. 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 I asked her to discuss any further      questions with Dr. Sandria Manly.  6.  I will see him back on a p.r.n. basis.      AEK/MedQ  D:  10/09/2004 10:17:39  T:  10/09/2004 10:56:21  Job #:  [id number]   cc:  Genene Churn. Love, M.D.  [address]  [address]  [place]  [place] [address]  Fax: [phone]   Arnell Asal, M.D.  [place], [place]  [phone]   [facility]   Physical therapy department  Neuro team

## 2010-10-26 NOTE — Op Note (Signed)
NAMEJERAN, HILTZ NO.:  [id number]   MEDICAL RECORD NO.:  [id number]          PATIENT TYPE:  AMB   LOCATION:  ENDO                         FACILITY:  [facility]   PHYSICIAN:  Georgiana Spinner, M.D.    DATE OF BIRTH:  10/26/1934   DATE OF PROCEDURE:  04/30/2005  DATE OF DISCHARGE:                                 OPERATIVE REPORT   PROCEDURE:  Colonoscopy   INDICATIONS:  Rectal bleeding.   ANESTHESIA:  Demerol 50 mg, Versed 6 mg.   PROCEDURE:  With the patient mildly sedated in the left lateral decubitus  position, a rectal examination was performed which was unremarkable.  Subsequently, the Olympus videoscopic colonoscope was inserted in the rectum  and passed under direct vision to the cecum, identified by ileocecal valve  and appendiceal orifice, both of which were photographed.  From this point  the colonoscope was slowly withdrawn taking circumferential views of the  colonic mucosa, stopping only in the rectum, which appeared normal on direct  showed hemorrhoidal tissue on retroflexed view.  The endoscope was  straightened and withdrawn.  The patient's vital signs and pulse oximetry  remained stable.  The patient tolerated the procedure well without  complications.   FINDINGS:  Rather prominent hemorrhoidal tissue, otherwise an unremarkable  examination.   PLAN:  Consider repeat examination in five to 10 years           ______________________________  Georgiana Spinner, M.D.     GMO/MEDQ  D:  04/30/2005  T:  04/30/2005  Job:  [id number]   cc:   Maxwell Caul, M.D.  Fax: [phone]

## 2010-10-26 NOTE — Assessment & Plan Note (Signed)
A 76 year old male who underwent a right shoulder intra-articular injection  February 12, 2005, and had good relief with that.  He has gotten some new  exercises from physical therapy; i.e., wall climbing exercises done with  both upper extremities.  In fact, his left shoulder pain has improved as  well.   He had a fall since I last saw him with no significant injury.  He was  walking the grass moving a hose after cutting the grass on his riding mower  when he fell, small laceration not requiring sutures on nose.   His pain complaint is about 2/10.   He climbs steps.  He drives but only short distances.  He is retired. He  lives with his wife who cares for him which really just involves continued  indoor household chores.  He does really the outdoor chores.   PHYSICAL EXAMINATION:  VITAL SIGNS:  Blood pressure 150/82, pulse 84,  respiratory rate 16, O2 saturation 97% on room air.  GENERAL:  No acute distress.  Mood and affect are appropriate.  Speech is  obviously aphasic.  He is understandable at short phrase level.  He has good  range of motion of left upper extremity without pain.  Right upper extremity  has a catch at about 120 degrees of abduction, otherwise less pain overall.  He has 4-/5 with 5+ tone x2 and finger and wrist flexor tone of 2 on the  Ashworth scale.   Ambulation without any knee instability using right AFO. He does have a  right KAFO for which he getting some adjustments on, using this because he  does have pain with hyperextension.  His knee has some snapping of his  tendon over the medial epicondyle.   IMPRESSION:  1.  Right-sided hemiparesis due to left cerebrovascular accident.  2.  Chronic aphasia.  3.  Spasticity in the right upper extremity. As I discussed, the patient may      benefit from Botox injections should this become more problematic.  4.  Right shoulder subacromial bursitis, improved after injection plus      conditional physical therapy.   I will see him back in two months.      Erick Colace, M.D.  Electronically Signed     AEK/MedQ  D:  03/18/2005 14:01:33  T:  03/18/2005 17:25:36  Job #:  [id number]

## 2010-10-26 NOTE — Discharge Summary (Signed)
NAMESHAFIQ, LARCH NO.:  [id number]   MEDICAL RECORD NO.:  [id number]          PATIENT TYPE:  INP   LOCATION:  4712                         FACILITY:  [facility]   PHYSICIAN:  Michaelyn Barter, M.D. DATE OF BIRTH:  1935/04/09   DATE OF ADMISSION:  12/24/2004  DATE OF DISCHARGE:  01/01/2005                                 DISCHARGE SUMMARY   FINAL DIAGNOSES:  1.  Dizziness.  2.  Orthostasis.  3.  History of multiple cerebrovascular accidents.  4.  Deconditioning.   OPERATION/PROCEDURE:  1.  CT scan of the head completed on December 24, 2004.  2.  CT scan of the spine completed December 24, 2004.  3.  Chest x-ray completed December 25, 2004.  4.  MRI of the brain completed December 25, 2004.  5.  MRA of the brain completed December 25, 2004.   CONSULTATIONS:  Cardiology with [facility].   PRIMARY CARE PHYSICIAN:  [employer].   HISTORY OF PRESENT ILLNESS:  Mr. Mitcheltree is a 76 year old gentleman who  arrived with the chief complaint of fall.  He states that he had been doing  well earlier in the day.  He was able to drive to his brother's place and  back home.  After getting out of the care, he fell.  His wife was in the  house at the time and she heard him fall.  She came out of the house and  found the patient in front of their house lying on his back with blood  coming from his head.  She called EMS and the patient could not recall what  had happened during this episode.  There was no report of any seizure-like  activity.   PAST MEDICAL HISTORY:  1.  Multiple cerebrovascular accidents with severe vasculopathic nature with      the most recent involving an acute infarction in the right corona      radiata and posteriorly the internal capsule.  The left ICA was      completely occluded. On the right the patient has had a carotid      endarterectomy.  His first CVA occurred in 1995.  2.  Cluster headaches.  3.  Nephrolithiasis.  4.  Cervical HNP with right shoulder  limitation.  5.  History of AAA.  6.  Hearing impairment.  7.  Chronic orthostasis and dizziness followed by Dr. Sandria Manly.  8.  Left shoulder bursitis.   PAST SURGICAL HISTORY:  1.  Right carotid endarterectomy.  2.  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   SOCIAL HISTORY:  Cigarettes:  The patient stopped three years ago.  Alcohol:  The patient rarely drinks alcohol.   FAMILY HISTORY:  Positive for diabetes and coronary artery disease.   HOSPITAL COURSE:  1.  Acute fall.  The patient was admitted to the hospital for further      evaluation of the fall.  A CT scan of the patient's head was completed      on July 17, the final  impression of which was no acute intracranial      abnormality, remote large left frontal and parietal infarct, right      corona radiata lacunar infarctions with chronic microvascular ischemic      change present, scalp hematoma posteriorly near the vertex.  No fracture      was identified.  Likewise he had a CT scan of his spine completed which      was negative for fracture or subluxation of cervical spine with      degenerative disease noted and atherosclerosis was also noted.  In      addition he had an x-ray of his hip completed.  This was the right side      and it was noted that there was no acute radiographic abnormalities.  He      did have bilateral degenerative joint disease affecting both hips,      however.  On July 18, the patient also had MRI of the brain completed      which revealed no acute brain lesion, old left middle cerebral artery      territory stroke, chronic small vessel changes in the deep brain. A MRI      angiography was completed also on the same date which revealed chronic      left ICA occlusion, no change in irregularity of the intracranial branch      vessels including the right anterior and middle cerebral vessels.  Over      the course of the hospitalization he did  not have any additional falls.      Physical therapy was consulted.  2.  History of orthostasis cardiology.  EP was consulted.  The note from      December 28, 2004 indicated that the patient did in fact have orthostasis.      They recommended at that particular time that the patient should not      drive any more.  They added Zoloft to his drug regimen 50 mg daily.      Pindolol 2.5 mg twice a day was also added. Likewise they also added      additional salt to increase his diet.  They stated that the additions      made to the patient's regimen today would take anywhere from two to      three weeks to become effective. Likewise they recommended rehab stay      secondary to the patient's debilitation.  They also went on in the      earlier notes to state that the patient did not need a tilt test      completed.  With the remaining days of his hospitalization, he stated      that his dizziness was slowly improving although it had not completely      resolved.  3.  Deconditioning.  Again physical therapy and occupational therapy      followed up the patient and the plan was to admit the patient to [facility]      once he became medically stable.  4.  Hypertension.  The patient does have history of hypertension.  However,      his blood pressure remains stable throughout the course of his      hospitalization.   CONDITION ON DISCHARGE:  On the day of discharge, the patient stated that he  felt sleepy but had no other additional complaints.  His vital signs on the  day of discharge showed temperature 97.8, heart rate  66, respirations 20,  blood pressure 139/86, and his saturation was 98% on room air.  The decision  was made to discharge the patient to [facility] and the patient was discharged on  January 01, 2005 to the [place] facility.      Michaelyn Barter, M.D.  Electronically Signed     OR/MEDQ  D:  03/22/2005  T:  03/23/2005  Job:  [id number]

## 2010-10-26 NOTE — Consult Note (Signed)
NAME:  [name] [name], [name] [name] NO.:  [id number]   MEDICAL RECORD NO.:  [id number]          PATIENT TYPE:  INP   LOCATION:  6525                         FACILITY:  [facility]   PHYSICIAN:  Pricilla Riffle, M.D.    DATE OF BIRTH:  June 12, 1934   DATE OF CONSULTATION:  12/27/2004  DATE OF DISCHARGE:                                   CONSULTATION   REASON FOR CONSULTATION:  [name] [name] is a 76 year old gentleman who I was  asked to see regarding syncope.   HISTORY OF PRESENT ILLNESS:  The patient has a history of CVA x2, first in  66.  He underwent right carotid endarterectomy.  His second was a left  middle cerebral artery CVA in 07/2004.   He has had a longstanding history of orthostasis.  He looses his balance  with standing.  His wife keeps a record of these blood pressures when  laying.  Blood pressure will be in the 150 range sitting, 130s standing, and  can down to the 90s.  Pulses were not recorded.  Note that he has a history  of borderline hypertension in the past treated with verapamil.  He will take  HydroDIURIL p.r.n. for a systolic blood pressure greater than 150.   In the a.m. of 12/23/2004, they were up later in the day visiting his  brother.  His wife preceded him into the house and then she heard a noise,  and he had fallen.  He had no feeling of what happened, no warning.  Eyes  glassy and wide open, poorly responsive.  In the ER, began to feel swimmy-  headed.  Note, echocardiogram 08/2004 showed an EF of 65-75%.  Mild diastolic  dysfunction.   MEDICATIONS:  1.  Citrucel.  2.  Lescol 40 q.h.s.  3.  Vitamin B6.  4.  Aggrenox.  5.  Os-Cal.  6.  Vitamin D.  7.  Antivert t.i.d.  8.  Protonix 40 daily.  9.  Senokot.   PAST MEDICAL HISTORY:  1.  History of cerebrovascular disease, status post right CVA, left ICA with      100% stenosis.  2.  Hypertension.  3.  Dyslipidemia.   SOCIAL HISTORY:  The patient is married.  He quit tobacco three years ago.  He  does not drink.  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   FAMILY HISTORY:  Mother died of an MI at age 81.  Father died of an MI at  the age of 1.  Nine boys, six girls, all but one with CAD.  One brother  died of leukemia at age 53.   REVIEW OF SYSTEMS:  Loss of 10 pounds weight.  Vertigo.  Presyncope,  syncope.  Right lower extremity weakness secondary to CVA.  Mild aphasia.   PHYSICAL EXAMINATION:  GENERAL:  On exam, the patient is in no distress.  VITAL SIGNS:  Blood pressure 142/76, pulse 72 sitting; 142/72, pulse 72  standing.  At two minutes, blood pressure 150/72, pulse 88  standing.  A six  minutes, blood pressure 120/80, pulse 96.  NECK:  Mild bilateral bruits.  CARDIAC:  Regular rate and rhythm.  S1 and S2.  No S3 or S4.  LUNGS:  Crackles at bases that clear with inspiration.  ABDOMEN:  Benign.  EXTREMITIES:  No lower extremity edema, 2+ pulses, right-sided weakness.  NEUROLOGIC:  Mild aphasia.  Alert and oriented x3.   STUDIES:  MRI, 12/25/2004, of the brain showed no acute brain lesions, old  left middle cerebral artery stroke.  EKG showed normal sinus rhythm.  Rate  75.   LABORATORY DATA:  Significant for a BUN and creatinine of 12 and 1.1,  potassium 3.8, hemoglobin 515.2, AM cortisol of 8, drug screen negative, TSH  0.264.  Cardiac markers were negative.   IMPRESSION:  The patient is a 76 year old gentleman with orthostasis and  syncope consistent with peripheral pooling and autonomic dysfunction.  Note,  he is hypertensive when laying.  Would recommend a trial of pindolol 2.5 mg  b.i.d., titrating to 5 mg b.i.d. as blood pressure tolerates.  May indeed  need an additional agent.  Could consider clonidine to stabilize heart rate  or phenobarbital low-dose (30 mg).  Will continue to follow.  Would give  ample fluids, increase salt intake.  Again, avoiding heavy meals and eat  small frequent meals.  Compression stockings may help some but abdominal  binder would be better.   Will continue to follow the patient's response.       PVR/MEDQ  D:  12/27/2004  T:  12/28/2004  Job:  [id number]

## 2010-10-26 NOTE — Discharge Summary (Signed)
[name] [name], CONGER NO.:  [id number]   MEDICAL RECORD NO.:  [id number]          PATIENT TYPE:  INP   LOCATION:  5739                         FACILITY:  [facility]   PHYSICIAN:  Lonia Blood, M.D.DATE OF BIRTH:  08/09/1934   DATE OF ADMISSION:  08/07/2004  DATE OF DISCHARGE:  08/14/2004                                 DISCHARGE SUMMARY   CHIEF COMPLAINT:  Abdominal pain and presyncope.   DISCHARGE DIAGNOSES:  1.  New right subcortical infarct.      1.  CT scan of the head August 07, 2004 without contrast notes old          large left frontal cerebrovascular accident with no acute findings.      2.  MRI/MRA of the brain August 08, 2004 notes large remote left frontal          and parietal lobe infarct.  Evidence of acute small nonhemorrhagic          infarct along the posterior aspect right corona radiata extending          towards the posterior aspect of the posterior limb right internal          capsule.  The MRA portion noted left internal carotid artery          occlusion.  Decreased visualization right middle cerebral artery          branch vessels, mild focal stenosis proximal A1 segment left          anterior cerebral artery, branch vessel atherosclerotic type          changes.      3.  Neurology consult over the course of hospitalization, Dr. Pearlean Brownie, Dr.          Sandria Manly.      4.  Carotid Doppler study August 09, 2004:  Right side no evidence          significant internal carotid artery stenosis.  Left side internal          carotid artery appeared occluded.  Vertebral artery flow antegrade.      5.  2-D echocardiogram August 10, 2004 notes overall left ventricular          systolic function vigorous with ejection fraction estimated 65-75%.          No evidence regional wall motion abnormality left ventricle.  Valve          not well visualized secondary to poor acoustic window.  No obvious          cardiac source embolism identified, though again, poor  quality study          and could not totally rule out on basis of this study.      6.  Serum homocysteine level borderline high initiation of Foltx therapy          (see laboratories below).      7.  Hyperlipidemia on Statin therapy.      8.  Transcranial Doppler study done August 13, 2004, results pending at  time of this dictation.  2.  Abdominal pain ultimately thought secondary to constipation, resolved      with increased laxatives.      1.  CT scan of the abdomen and pelvis with contrast August 07, 2004          with abdominal pain portion noting abdominal aortic aneurysm with          prominent flap that appear to be more prominent than previous.  No          definite acute hemorrhage.  Pelvic portion noted suggestion probable          extension and dissection into the left iliac with no significant          aneurysmal dilation seen.      2.  Reviewed by Dr. Arbie Cookey in the emergency room who did not feel this          represented any contribution or change.  3.  Stage I perirectal skin breakdown, stable.  p.r.n. barrier cream to the      site.  4.  Borderline high homocysteine level on Foltx therapy.  5.  Hyperlipidemia on Statin therapy.  6.  History of hypertension with orthostatic hypotension.      1.  This has been a long-time patient complaint, but has not suffered a          fall or syncope as far as I can determine.      2.  Postural blood pressure check on August 14, 2004 finds 149/77 in          standing, 104/72 representing an approximate 40 mmHg blood pressure          drop with position change.  7.  Deconditioning.      1.  Physical and occupational therapy review conducted.  Recommendation          rehabilitation unit placement for post stroke physical and          occupational therapy in that setting.  8.  Cough responsive to Robitussin p.r.n.  9.  Some indication possible blood in stool per office visit July 26, 2004.  Stools checked, OB negative  but patient was referred to Dr.      Sabino Gasser, gastroenterology.  10. History rheumatoid arthritis on Relafen and steroids tapered to      discontinuance early 2005.  11. History nephrolithiasis.  12. Known small abdominal aortic aneurysm.      1.  CT scan August 07, 2004  noted abdominal aortic aneurysm measuring          3.8 cm in diameter.  13. History root impingement in the neck.  14. History of bone spur surgery on the right.  15. History hand surgery for rheumatoid arthritis.  16. History inguinal hernia repair surgery.  17. History tonsillectomy.  18. Allergies listed to TAPE tolerating paper tape only.  19. Code status currently full code.   DISCHARGE MEDICATIONS:  1.  Zocor 20 mg daily at bedtime.  2.  Calcium 500 mg with vitamin D twice daily.  3.  Aggrenox one tablet daily.  This should be increased to one tablet twice      daily in two weeks.  4.  Foltx one tablet daily.  5.  MiraLax 17 g powder mixed with 8 ounces of water or juice twice daily.  6.  Guaifenesin 5 mL every four hours as needed for cough.  7.  Tylenol  650 mg every four hours as needed for mild pain or fever.  8.  Ativan 0.5 mg daily at bedtime as needed for anxiety.  9.  Vicodin 5/500 mg one tablet every six hours as needed for moderate pain.   SPECIAL DISCHARGE INSTRUCTIONS:  1.  Disposition:  Patient to be discharged to the rehabilitation unit and      followed under the services of rehabilitation physicians there.  2.  Activity:  Up as tolerated per supervision physical and occupational      therapy.  3.  Discharge diet:  Low sodium, low fat, low cholesterol.  4.  Physical and occupational therapy evaluate and treat as appropriate.  5.  Follow up with neurology.  6.  Follow perirectal skin breakdown with barrier cream to the sites as      needed.  7.  Would continue to follow postural blood pressure drops and postural     blood pressure precautions with physical therapy.  8.  Follow up  laboratories per discretion rehabilitation physician.   CONSULTS:  Neurology services, Dr. Pearlean Brownie, Dr. Sandria Manly, Dr. Anne Hahn representing  code stroke team.   PROCEDURES:  1.  Carotid Doppler study August 09, 2004 with results as above.  2.  2-D echocardiogram August 10, 2004 with results as above.  3.  CT scan of the head, abdomen, pelvis August 07, 2004 with results as      above.  4.  MRI/MRA of the brain August 08, 2004, results as above.  5.  Transcranial Doppler study done August 10, 2004 with results pending at      time of this dictation.   DISCHARGE LABORATORIES:  Last CBC done August 08, 2004 found WBC 9.4,  hemoglobin 15.1, hematocrit 43.4, platelets 195.  Metabolic panel August 08, 2004 found sodium 142, potassium 3.7, chloride 106, CO2 25, BUN 11,  creatinine 1.0.  Liver function normal and albumin 3.7.  Hemoglobin A1C was  5.7.  Lipid panel:  Cholesterol 149, triglyceride high 183, HDL 51, LDL 61,  VLDL 37.  a.m. cortisol 16.1.  Homocysteine 12.08.  Urine dipstick negative.  Stool negative x2 for occult blood.  Radiology as above.   HISTORY OF PRESENT ILLNESS:  A 76 year old male resident at home.  Lives  with his wife.  He is followed in primary care by Dr. Kerry Dory at [facility] as an outpatient.  Has a prior history left parietal CVA.  He  was managing at home reasonably well, ambulating independently, reasonably  independent ADLs, and quite functional.  He was in the bathroom and  developed localized abdominal pain.  He felt weak and presyncopal.  He made  it to the cough, but became too weak to stand.  There was no nausea,  vomiting, melena, hematochezia.  He came to the emergency room and a code  stroke was called.  Dr. Anne Hahn saw the patient.  He did not feel the patient  was suffering from an acute stroke.  CT scan of the head showed no acute  findings August 06, 2004 in the emergency room.  Because of abdominal  pain, a CT scan of the abdomen and pelvis done.   This noted a 3 cm abdominal  aortic aneurysm.  This was known per prior history.  Dr. Arbie Cookey was contacted  and he does not feel that this was contributory or changed from previous CT  scan.  Patient still complained of inability to ambulate and weakness.  The  patient does have  a known history of orthostatic blood pressure drop but  this has not previously resulted syncope or fall.  He was admitted for  further evaluation and treatment.  For dictated past medical history,  admission medications, physical examination, laboratory data, impressions,  and plan, see dictated admission history and physical dated August 07, 2004.   HOSPITAL COURSE:  #1 - NEW RIGHT SUBCORTICAL INFARCT:  The patient presented  as per history of present illness.  Follow up bedside examination concerning  cerebellar CVA.  Dr. Sandria Manly, neurology, followed up on the patient.  An MRI/MRA of the brain was done in follow-up August 08, 2004 with contrast.  This did suggest an acute small nonhemorrhagic infarct posterior aspect  right corona radiata.  Full report of the MRI/MRA as above.  Dr. Sandria Manly  recommended discontinuance of previous aspirin and initiation Aggrenox  therapy, also checking homocysteine and lipids.  A carotid Doppler study was  done confirming old left ICA occlusion.  Found no evidence ICA stenosis on  the right and vertebral artery flow was antegrade.  Patient had a 2-D  echocardiogram August 10, 2004 showing essentially normal left ventricular  function and ejection fraction estimated 65-75%.  Study was inadequate to  completely rule out a cardiac source of embolism, although none were  identified per this study.  Patient was also seen over hospitalization, Dr.  Pearlean Brownie.  He ordered a transcranial Doppler, although this was done on August 13, 2004.  Results are still pending at time of this dictation.  The patient  had a lipid panel done.  He was previously on Lescol and is currently on  Zocor.  Results of the  laboratory as above.  Patient had a homocysteine  level drawn which was high normal.  He was initiated on Foltx therapy.  Patient had physical and occupational therapy evaluation over the course of  hospitalization and this is discussed below.   #2 - ABDOMINAL PAIN:  This is one of patient's presenting complaints.  He  has a known history of chronic constipation.  He did have a CT scan of the  abdomen and pelvis August 07, 2004.  This noted no acute process abdominal  section.  He has a known abdominal aortic aneurysm and the size was stable  at 3.8 cm.  There was some question for the pelvic portion of probable  extension of the dissection into the left iliac.  The patient did have a  review by Dr. Arbie Cookey, CVTS.  Felt the aneurysm was stable and unchanged and  was noncontributory to patient's complaint of abdominal pain.  Ultimately,  this was felt to be secondary to patient's constipation.  His laxatives were  adjusted and he is producing bowel movements now routinely.  He has no  further complaints of abdominal pain.   #3 - STAGE I PERIRECTAL SKIN BREAKDOWN:  This was noted during examination.  Barrier cream utilized and I requested that if this is a further problem  this can be utilized p.r.n. post discharge.   #4 - MILD BORDERLINE ELEVATED HOMOCYSTEINE:  Again, the homocysteine level  was checked as part of the post stroke work-up.  This was borderline high  12.08 and patient is on Foltx.   #5 - HYPERLIPIDEMIA:  This is apparently not a new problem.  Patient was on  Lescol prior to hospitalization.  A lipid panel was checked with all values  essentially normal with the exception of triglyceride elevated 183.  The  patient is placed on  Zocor per hospital substitution and this continues at  time of transfer.  A hemoglobin A1C was checked to evaluate for any possible diabetes and this was normal range of 5.7.  Lipid panels and liver functions  should be followed up routinely post  discharge.   #6 - HISTORY OF HYPERTENSION WITH MORE RECENT POSTURAL HYPOTENSION:  Apparently, this has been a problem over the past one to two years.  Wife  states that she has documented significant postural blood pressure changes  with systolic pressures dropping to the 80s.  Patient is on no  antihypertensive medications.  He did have postural blood pressure check  August 14, 2004.  Supine 149/77 with a pulse of 74 and standing 104/72 with a  pulse of 84.  This represents approximately 40 mmHg blood pressure drop with  position change.  Patient describes mild dizziness, but has had no falls or  actual syncope.  Would be reluctant to use medication such as midodrine to  improve his postural drop if this resulted in increased blood pressures  considering his stroke history.   #7 - DECONDITIONING:  The patient was seen post stroke by physical and  occupational therapy.  Recommendations are for rehabilitation unit placement  for continued physical and occupational therapy in that setting.  Arrangements will be made to transfer patient for that purpose today.   #8 - COUGH:  Patient complained of a nonproductive cough.  Guaifenesin  utilized with good results and I have continued this p.r.n. post transfer.   #9 - CODE STATUS:  Patient currently full code.   DISPOSITION:  Transfer to rehabilitation unit today for continued physical  and occupational therapy in that setting.   ACTIVITY:  Up as tolerated with physical and occupational therapy  supervision.   DISCHARGE DIET:  Low sodium, low fat, low cholesterol.   CONDITION ON TRANSFER:  Stable/improved, though frail with multiple medical  problems as above.   DISCHARGE PROCESS:  Greater than 30 minutes 8:30 a.m. through 9:30 a.m.      TC/MEDQ  D:  08/14/2004  T:  08/14/2004  Job:  [id number]   cc:   Genene Churn. Love, M.D.  [address]  [address]  [place]  [place] [address]  Fax: [phone]   Pramod P. Pearlean Brownie, MD  Fax: [phone]   C.  Lesia Sago, M.D.  [address]  [address]  [place]  [place] [address]  Fax: [phone]

## 2010-10-26 NOTE — Assessment & Plan Note (Signed)
MEDICAL RECORD NUMBER:  [id number]   DATE OF BIRTH:  27-Apr-1935   Mr. Miner returns today. He was admitted for a syncopal episode which  appears to have been related to blood pressure dropping out, orthostatic  type, on December 24, 2004. He went to the subacute care unit and was discharged  approximately 4 weeks ago after completion of therapy there. Following the  subacute care unit he followed up with his primary doctor. He has been going  through some physical therapy and it has been noted that he has had some  right greater than left shoulder pain, as well as right posterior knee pain.  He has been noted to have been hyperextending his knee on the right side. He  does take some Aleve for this. He does have a KAFO being fabricated by  [employer]. He does not climb steps. He has not drive since December 25, 5407. He denies any further syncopal or presyncopal episodes and his  wife confirms this. He needs some help with putting on his low-extremity  compression garments as well as with shaving.   He has had no other new intercurrent medical conditions. Social history is  unchanged. He lives with his wife, who assists him minimally, evens helps  carrying groceries while he is ambulating.   EXAMINATION:  Blood pressure 116/75, pulse 78, respirations 18, O2  saturation 98% room air.   His speech remains with a nonfluent aphasia. He has good comprehension and  can communicate at at least a word or phrase level.   He does have bilateral impingement signs with positive Hawkins maneuvers  bilaterally at the shoulder. He has no evidence of pain in the [facility] joint  area. He has reduced shoulder abduction bilaterally but good adduction and  forward flexion only lacks about 20 degrees.   His right knee has no evidence of effusion. He has no evidence of  ligamentous laxity. He does have some tenderness and some snapping of his  right medial hamstrings group. No pain over the pes  anserinus.   He has 4-/5 strength in the right quad compared to 5 on the left. His ankle  dorsiflexion is 3- on the right and 5 on the left.   IMPRESSION:  1.  Left middle cerebral artery distribution cerebrovascular accident      causing right hemiparesis, distal greater than proximal. He has      spasticity mainly in the right biceps as evidenced by flexed pattern      during walking. Also has some problem with extension of his right arm      during functional activities. This inhibits some of his reaching.  2.  Bilateral subacromial bursitis.  3.  Right hamstring tendonitis.   PLAN:  1.  Will inject right shoulder today.  2.  In terms of his right hamstring tendonitis, this really will not get      much better until he gets his KAFO to prevent recurrent hyperextension      of the knee. He can continue his Aleve until that time.  3.  Will do a right musculocutaneous nerve block to see if this improves      function as well as smooth out gait pattern. Will do it initially with      Marcaine and if helpful consider phenol.  4.  I will hold off injecting the left shoulder today and follow up next      visit on that.   ADDENDUM:  Right subacromial bursa injection. Indication:  Right  supraspinatus tendonitis. Area marked, prepped with Betadine, posterolateral  approach. A 25-gauge 1.5-inch needle inserted to a dept of 0.5 inch.  Solution  containing 0.5 mL of 40 mg Kenalog plus 0.5 mL of Marcaine 0.25% plus 2 mL  of lidocaine 1% were injected after negative drawback for blood. The patient  tolerated the procedure well. Post injection instructions given.      Erick Colace, M.D.  Electronically Signed     AEK/MedQ  D:  02/12/2005 13:06:43  T:  02/12/2005 13:41:21  Job #:  [id number]   cc:   Genene Churn. Love, M.D.  Fax: [phone]   Maxwell Caul, M.D.  Fax: [phone]

## 2010-10-26 NOTE — Discharge Summary (Signed)
[name], [name] NO.:  [id number]   MEDICAL RECORD NO.:  [id number]          PATIENT TYPE:  IPS   LOCATION:  4036                         FACILITY:  [facility]   PHYSICIAN:  [name] [name], [name] CameronDATE OF BIRTH:  1934/06/18   DATE OF ADMISSION:  08/14/2004  DATE OF DISCHARGE:  08/24/2004                                 DISCHARGE SUMMARY   DISCHARGE DIAGNOSES:  1.  Right internal capsule, right corona radiata infarct.  2.  History of old cerebrovascular accident with left spastic right upper      extremity and expressive aphasia.  3.  Hypertension.  4.  Dyslipidemia.  5.  Headaches, resolving.   HISTORY OF PRESENT ILLNESS:  [name] [name] is a 76 year old male with history  of hypertension and cerebrovascular accident, admitted August 07, 2004  with pre-syncope and weakness. Also noted to have complaints of abdominal  pain in the emergency department. A patient with history of abdominal aortic  aneurysm. CT of abdomen was done showing more prominence of the aneurysm but  no acute hemorrhage. CT of the head showed old large frontal cerebrovascular  accident. MRI of brain done showed acute infarct in right corona radiata  extending to posterior limb and right internal capsule. Left internal  carotid artery was occluded. Carotid Doppler showed occluded left internal  carotid artery. Cardiac echocardiogram done showed an ejection fraction of  65% to 75%, no thrombus. The patient was started on Aggrenox for  cerebrovascular accident prophylaxis for neurologic input. He is noted to  have some headaches with this and they are treated with Vicodin. The patient  continues with ataxia that is improving. Dysarthria and aphasia at baseline  per family. Therapy is initiated and the patient currently has supervision  for bed mobility, moderate assistance. For transfers, moderate assistance in  ambulating with cue's for posture and safety. He is minimal to moderate  assistance for activities of daily living. __________for progression.   PAST MEDICAL HISTORY:  Significant for left frontal cerebrovascular accident  with spastic right upper extremity, right hand with multiple surgeries and  second finger amputation. Hypertension with occasional orthostasis and  dyslipidemia. Right cerebrovascular accident in 1995, abdominal aortic  aneurysm, left total hip replacement, basal cell cancer, nephrolithiasis,  __________ PV threshold alimentation, right shoulder limitation and history  of cluster migraines in the distant past.   ALLERGIES:  No known drug allergies.   FAMILY HISTORY:  Positive for diabetes and cancer.   SOCIAL HISTORY:  The patient has been married for a year to [name] [name] of  20 years. Lives in a 1 level home with 3 steps at entry. Spends time between  [place] and [place]. Has quit tobacco on and off, most  recently a few months ago. Rare alcohol use.   HOSPITAL COURSE:  [name] [name] was admitted to rehabilitation on  August 14, 2004 for inpatient therapies to consist of PT and OT daily. At the  time of admission, he was noted to have some expressive aphasia and  occasional difficulty following multi-step commands without cueing. Noted to  have decreased sensation right upper  extremity and right lower extremity  with ataxia, right upper greater than right lower. Mild ataxia noted of left  upper extremity. The patient has been treated on Aggrenox for  cerebrovascular accident prophylaxis and complaints of headache. He was pre-  medicated with Vicodin daily with Aggrenox in the a.m. Blood pressures were  monitored on a b.i.d. basis and that showed good control without the use of  any anti-hypertensives.   LABORATORY DATA:  Hemoglobin 14.9, hematocrit 5.9, platelets 232,000, sodium  141, potassium 3.9, chloride 106, CO2 26, BUN 11, creatinine 1.0, glucose  118. Liver function studies revealed low albumin at 3.4 and  ALT at 58. Urine  C&S done showed no growth.   The patient's headache did improve during [name] stay and are having the  patient to go up to b.i.d. Aggrenox dosing at time of discharge on August 24, 2004. The patient advised to Tylenol or Vicodin for headaches as indicated.  During [name] stay in rehabilitation, the patient progressed to being at  modified independent level for transfers. He is at supervision levels for  ambulating at 250 feet on all surfaces without assistive device. [name] ataxia  has improved overall. He is at modified independent levels for upper body  care, supervision for lower body care. He has full range of motion of left  upper extremity, although coordination is decreased. Speech therapy  evaluation showed the patient to be hard of hearing, however, basic  comprehension was intact. The patient is able to follow 2 step commands.  Basic expression was intact for automatic speech. He was able to name items  in the room with extra time and for word finding problems. Speech was clear  at 90% to 100% with mild substitution of words to compensate for [name]  dysarthria. High level of expression continues to be impaired. [name] [name] will  continue to provide supervision, minimal assistance as needed past  discharge. The patient will also continue with followup outpatient PT and OT  at [facility] beginning August 30, 2004.   DISPOSITION:  On August 24, 2004, the patient is discharged to home.   DISCHARGE MEDICATIONS:  1.  Aggrenox 1 p.o. b.i.d.  2.  Lescol 40 mg q.h.s.  3.  Foltx 1 per day.  4.  Vicodin 1 p.o. b.i.d. p.r.n. headaches.  5.  Tylenol p.r.n.  6.  May use Os-Cal Plus D b.i.d.   ACTIVITY:  Supervision to minimal assistance.   DIET:  Low-fat.   FOLLOW UP:  1.  The patient to followup with [name] [name], Oct 09, 2004 at 9:30 a.m.  2.  Followup with [name] [name] and [name] [name] in the next few weeks.   SPECIAL INSTRUCTIONS:  No alcohol, no smoking, no  driving.      PP/MEDQ  D:  08/24/2004  T:  08/25/2004  Job:  [id number]   cc:   [name] [name], M.D.  [address]  [address]  [place]  [place] [address]  Fax: [phone]   [name] [name], M.D.  [address]  [place]  [place] [address]  Fax: [phone]

## 2010-10-26 NOTE — Discharge Summary (Signed)
[name], [name] [name] NO.:  [id number]   MEDICAL RECORD NO.:  [id number]          PATIENT TYPE:  ORB   LOCATION:  4501                         FACILITY:  [facility]   PHYSICIAN:  Michaelyn Barter, M.D. DATE OF BIRTH:  1934/07/18   DATE OF ADMISSION:  01/01/2005  DATE OF DISCHARGE:  01/09/2005                                 DISCHARGE SUMMARY   DISCHARGE DIAGNOSIS:  1.  Autonomic dysfunction with orthostatic hypotension.  2.  Peripheral vertigo.  3.  Known peripheral vascular disease status post cerebrovascular accident      in February 2006.  4.  History of hypertension.  5.  History of dyslipidemia.   DISCHARGE MEDICATIONS:  Aggrenox 1 tablet p.o. b.i.d., Antivert 25 mg p.o.  t.i.d., MiraLax 17 grams in 8 ounces of water p.o. daily, Tylenol p.r.n.,  Protonix 40 mg p.o. daily, Zocor 20 mg p.o. daily, multi-vitamin one p.o.  daily, Ambien 5 mg p.o. q.h.s. p.r.n., Xanax 0.25 mg p.o. b.i.d. p.r.n.   CONSULTATIONS:  As noted, [facility].   PROCEDURE:  None.   HOSPITAL COURSE:  The patient is a 76 year old gentleman with a history of  peripheral vascular disease status post CVA who was admitted for  rehabilitation.  He did have episodes of syncope for which cardiology was  consulted.  It was noted that the patient has a long history of orthostasis  and, therefore, non-pharmacological measures such as increased salt in his  diet and compression waist high stockings were initiated.  The patient  continued with his rehabilitation and did well.  His orthostasis also  improved with non-pharmacological measures and as needed Antivert.  At the  time of discharge, he was doing well, with no further episodes of syncope  noted.  His most recent blood pressures checked on January 07, 2005, showed  blood pressure 117/77 with a pulse of 89 standing, blood pressure 122/72  with a pulse of 83 sitting, and blood pressure 122/71 with pulse of 83 lying  down.   CONDITION ON  DISCHARGE:  Improved.  The patient is normotensive and  afebrile, vital signs stable.  Lungs clear.  Heart rate is regular.  He has  persistent right sided hemiparesis with some aphasia, but otherwise, doing  much better.   DISCHARGE INSTRUCTIONS:  The patient has a regularly scheduled follow up  appointment with [facility] on August 7 which he is expected to  keep.  He can follow up with [facility] on a p.r.n. basis.  He is  given prescriptions for the MiraLax, Aggrenox, and Antivert.      Danae Chen, M.D.  Electronically Signed     ______________________________  Michaelyn Barter, M.D.    RLK/MEDQ  D:  01/09/2005  T:  01/09/2005  Job:  [id number]

## 2010-10-26 NOTE — H&P (Signed)
[name] [name], [name] [name].:  [id number]   MEDICAL RECORD [name].:  [id number]          PATIENT TYPE:  IPS   LOCATION:  4036                         FACILITY:  [facility]   PHYSICIAN:  Erick Colace, M.D.DATE OF BIRTH:  September 28, 1934   DATE OF ADMISSION:  08/14/2004  DATE OF DISCHARGE:                                HISTORY & PHYSICAL   DATE OF BIRTH:  [birth year].   MEDICAL RECORD NUMBER:  [id number]   CHIEF COMPLAINT:  New left-sided weakness.   HISTORY:  This is a 76 year old male with history of hypertension, previous  left MCA CVA causing right hemiparesis and aphasia.  He as admitted August 07, 2004, with presyncope and weakness, also complained of some abdominal  pain in the ED.  CT of head showed an old large left and frontal CVA, MCA  distribution.  He has history of AAA.  CT of abdomen showed more prominence  but [name] hemorrhage.  MRI of the brain showed acute infarct in the right  corona radiatum and posterior limb of the internal capsule.  The left ICA  was occluded.  Carotid Dopplers did show this.  Cardiac echocardiogram  showed ejection fraction 65 to 75% with [name] thrombus.  He was started on  Aggrenox for CVA prophylaxis.  Has had some headaches secondary to Aggrenox.   FUNCTIONAL STATUS:  He was ambulating without a device.  He had gone through  about 3-1/2 years of aphasia therapy post his first stroke in 59.   REVIEW OF SYSTEMS:  Negative for GI, GU, cardiorespiratory.  MUSCULOSKELETAL:  Positive for weakness.  HEENT:  Negative for dysarthria.   PAST MEDICAL HISTORY:  1.  As noted above.  2.  Right hand multiple surgeries.  3.  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  4.  History of cluster headaches.  5.  History of nephrolithiasis.  6.  Cervical HNP with right shoulder limitation.  7.  AAA at 3.8 cm.  8.  History of left total hip replacement.  9.  Decreased hearing.  10. Left shoulder cervical bursitis.   FAMILY  HISTORY:  Diabetes.  Positive for CAD.   SOCIAL HISTORY:  Married x 1 year.  Lives in one-level home with three steps  to entry.  Spends his time between [place] and [place].  Rare  ETOH:  Quit tobacco three years ago.   Functional history prior to admission, was driving.   Current functional status is supervision bed mobility, moderate assistance  transfers, ambulate 24 feet with moderate assistance, cues for posture,  minimum assistance on don and doff gown, minimum assistance and moderate  assistance balance.   MEDICATIONS:  1.  Vicodin for pain.  2.  Phenergan p.r.n.  3.  Tylenol p.r.n.  4.  Sorbitol p.r.n.  5.  MiraLax 17 g p.o. daily.  6.  Trazodone p.r.n.  7.  Lescol 40 mg daily.  8.  Os-Cal 500 b.i.d.  9.  Aggrenox 1 capsule daily through March 17, then increased to b.i.d.  10. Foltx 1 p.o. daily.  11. Lovenox 40 mg p.o.  daily.  12. Dulcolax suppository 1 per rectum daily p.r.n.   LABORATORY DATA AND OTHER STUDIES:  Last CBC showed a white count of 9.1,  hemoglobin 15.6.  Last BUN 13, creatinine 1.1.  Last potassium 4.1.  Last  hemoglobin A1C 5.7.  Triglycerides 183.   PHYSICAL EXAMINATION:  GENERAL:  Elderly male in [name] acute distress.  Mood  and affect appropriate.  Speech obviously aphasic, but he is able to do  short-phrase communication.  He has good comprehension.  He has mild  dysarthria.  HEENT:  Eyes are not injected.  Anicteric.  ENT  exam is normal.  NECK:  Supple without adenopathy.  LUNGS: Clear.  Respiratory effort is normal.  HEART:  Regular rate and rhythm.  [name] murmurs or extra sounds.  EXTREMITIES:  [name] clubbing, cyanosis, or edema.  Intact pedal pulses  bilaterally.  ABDOMEN:  Positive bowel sounds.  Soft, nontender to palpation.  [name] thrills.  SKIN:  Dry but otherwise [name] abnormalities.  NEUROLOGIC:  Sensation is reduced right upper extremity and right lower  extremity.  Right upper extremity has ataxia greater than right lower   extremity.  Mild ataxia left upper extremity and left lower extremity.  He  is alert and oriented x 3 within limits of his communication.  Mood and  affect are without lability or agitation.  Motor strength is 4+/5 on the  right side and 5-/5 on the left side.   IMPRESSION:  1.  Functional deficit due to acute infarct right corona radiata and right      internal capsule causing ataxia.  2.  Small vessel disease on Aggrenox for cerebrovascular accident      prophylaxis.  3.  Headaches.  Schedule Vicodin.  I feel these will subside with time and      will be able to proceed with decreased Aggrenox dosage on March 17.  4.  Deep vein thrombosis prophylaxis with subcutaneous Lovenox.  5.  Dyslipidemia on Lescol.  6.  Constipation on MiraLax.   Estimated length of stay is 7 to 14 days.   The patient is a good rehab candidate.  Prognosis for further functional  improvement is good.  He has a good care giver, and his wife will take care  of him when he gets home.      AEK/MEDQ  D:  08/14/2004  T:  08/14/2004  Job:  [id number]

## 2010-11-03 ENCOUNTER — Emergency Department (HOSPITAL_COMMUNITY): Payer: Medicare Other

## 2010-11-03 ENCOUNTER — Inpatient Hospital Stay (HOSPITAL_COMMUNITY)
Admission: EM | Admit: 2010-11-03 | Discharge: 2010-11-06 | DRG: 563 | Disposition: A | Discharge status: Skilled Nursing Facility | Payer: Medicare Other | Attending: Family Medicine | Admitting: Family Medicine

## 2010-11-03 DIAGNOSIS — R4701 Aphasia: Secondary | ICD-10-CM | POA: Diagnosis present

## 2010-11-03 DIAGNOSIS — W010XXA Fall on same level from slipping, tripping and stumbling without subsequent striking against object, initial encounter: Secondary | ICD-10-CM | POA: Diagnosis present

## 2010-11-03 DIAGNOSIS — I714 Abdominal aortic aneurysm, without rupture, unspecified: Secondary | ICD-10-CM | POA: Diagnosis present

## 2010-11-03 DIAGNOSIS — I69959 Hemiplegia and hemiparesis following unspecified cerebrovascular disease affecting unspecified side: Secondary | ICD-10-CM

## 2010-11-03 DIAGNOSIS — M216X9 Other acquired deformities of unspecified foot: Secondary | ICD-10-CM | POA: Diagnosis present

## 2010-11-03 DIAGNOSIS — R5381 Other malaise: Secondary | ICD-10-CM | POA: Diagnosis present

## 2010-11-03 DIAGNOSIS — Y998 Other external cause status: Secondary | ICD-10-CM

## 2010-11-03 DIAGNOSIS — S42209A Unspecified fracture of upper end of unspecified humerus, initial encounter for closed fracture: Principal | ICD-10-CM | POA: Diagnosis present

## 2010-11-03 DIAGNOSIS — E785 Hyperlipidemia, unspecified: Secondary | ICD-10-CM | POA: Diagnosis present

## 2010-11-03 DIAGNOSIS — R32 Unspecified urinary incontinence: Secondary | ICD-10-CM | POA: Diagnosis present

## 2010-11-03 LAB — POCT I-STAT, CHEM 8
Calcium, Ion: 0.91 mmol/L — ABNORMAL LOW (ref 1.12–1.32)
Chloride: 107 mEq/L (ref 96–112)
Creatinine, Ser: 1.2 mg/dL (ref 0.4–1.5)
HCT: 42 % (ref 39.0–52.0)
Hemoglobin: 14.3 g/dL (ref 13.0–17.0)
Potassium: 4.4 mEq/L (ref 3.5–5.1)
TCO2: 17 mmol/L (ref 0–100)

## 2010-11-03 LAB — DIFFERENTIAL
Basophils Absolute: 0.1 10*3/uL (ref 0.0–0.1)
Eosinophils Absolute: 0.5 10*3/uL (ref 0.0–0.7)
Monocytes Relative: 7 % (ref 3–12)
Neutrophils Relative %: 80 % — ABNORMAL HIGH (ref 43–77)

## 2010-11-03 LAB — CBC
HCT: 42.9 % (ref 39.0–52.0)
MCH: 31.1 pg (ref 26.0–34.0)
MCHC: 32.9 g/dL (ref 30.0–36.0)
WBC: 12.2 10*3/uL — ABNORMAL HIGH (ref 4.0–10.5)

## 2010-11-03 LAB — PROTIME-INR
INR: 0.89 (ref 0.00–1.49)
Prothrombin Time: 12.3 seconds (ref 11.6–15.2)

## 2010-11-03 LAB — APTT: aPTT: 24 seconds (ref 24–37)

## 2010-11-04 DIAGNOSIS — S42293A Other displaced fracture of upper end of unspecified humerus, initial encounter for closed fracture: Secondary | ICD-10-CM

## 2010-11-04 DIAGNOSIS — I6990 Unspecified sequelae of unspecified cerebrovascular disease: Secondary | ICD-10-CM

## 2010-11-04 DIAGNOSIS — K5900 Constipation, unspecified: Secondary | ICD-10-CM

## 2010-11-04 LAB — BASIC METABOLIC PANEL
CO2: 24 mEq/L (ref 19–32)
Chloride: 103 mEq/L (ref 96–112)
GFR calc Af Amer: >60 mL/min (ref >60)
GFR calc non Af Amer: >60 mL/min (ref >60)
Glucose, Bld: 136 mg/dL — ABNORMAL HIGH (ref 70–99)
Potassium: 4.7 mEq/L (ref 3.5–5.1)
Sodium: 135 mEq/L (ref 135–145)

## 2010-11-04 LAB — CBC
RBC: 4.34 MIL/uL (ref 4.22–5.81)
WBC: 13.2 10*3/uL — ABNORMAL HIGH (ref 4.0–10.5)

## 2010-11-05 ENCOUNTER — Inpatient Hospital Stay (HOSPITAL_COMMUNITY): Payer: Medicare Other

## 2010-11-05 LAB — CBC
HCT: 38.2 % — ABNORMAL LOW (ref 39.0–52.0)
MCH: 31.6 pg (ref 26.0–34.0)
MCHC: 33.5 g/dL (ref 30.0–36.0)
Platelets: 219 10*3/uL (ref 150–400)
RDW: 12.5 % (ref 11.5–15.5)

## 2010-11-05 LAB — BASIC METABOLIC PANEL
BUN: 14 mg/dL (ref 6–23)
Chloride: 103 mEq/L (ref 96–112)
Creatinine, Ser: 0.91 mg/dL (ref 0.4–1.5)
GFR calc Af Amer: >60 mL/min (ref >60)
GFR calc non Af Amer: >60 mL/min (ref >60)

## 2010-11-06 LAB — CBC
Hemoglobin: 13.1 g/dL (ref 13.0–17.0)
MCH: 30.9 pg (ref 26.0–34.0)
Platelets: 237 10*3/uL (ref 150–400)
RBC: 4.24 MIL/uL (ref 4.22–5.81)

## 2010-11-06 LAB — BASIC METABOLIC PANEL
BUN: 12 mg/dL (ref 6–23)
CO2: 31 mEq/L (ref 19–32)
Calcium: 9.1 mg/dL (ref 8.4–10.5)
Creatinine, Ser: 0.84 mg/dL (ref 0.4–1.5)
GFR calc Af Amer: >60 mL/min (ref >60)

## 2010-11-06 NOTE — Consult Note (Signed)
[name] [name], [name] [name]             ACCOUNT NO.:  [id number]  MEDICAL RECORD NO.:  [id number]           PATIENT TYPE:  I  LOCATION:  5003                         FACILITY:  [facility]  PHYSICIAN:  Madlyn Frankel. Charlann Boxer, M.D.  DATE OF BIRTH:  23-Dec-1934  DATE OF CONSULTATION:  11/03/2010 DATE OF DISCHARGE:                                CONSULTATION   CHIEF COMPLAINT:  Right proximal humerus fracture.  [name] [name] is a 76 year old gentleman who unfortunately suffered a left-sided cerebrovascular accident in 1995 and subsequent repeat cerebrovascular accident in 2006.  He had residual right-sided deficits in his upper and lower extremities.  He gets around with a knee locking brace due to hyperextension, but has significant footdrop.  His right upper extremity has limited functional use with focused efforts. Apparently, on Nov 03, 2010, he was turned to carry with some dry cleaning when he stumbled and fell and landed on his right side.  He was brought to the emergency room.  Based on his medical history, Medical Service was asked to admit the patient and Orthopedics was consulted.  He was placed into a splint and sling in the emergency room and admitted to the floor.  PAST MEDICAL HISTORY: 1. Cephalalgia. 2. Gait disturbance. 3. Aphasia. 4. Benign positional vertigo. 5. Hyperlipidemia. 6. Hypertension. 7. History of abdominal aortic aneurysm, stable. 8. Peripheral vascular disease. 9. Carotid artery disease. 10.History of cerebrovascular accident with right-sided hemiparesis.  PAST SURGICAL HISTORY:  Right endarterectomy.  CURRENT MEDICATIONS: 1. Solifenacin 10 mg p.o. daily. 2. Calcium over the counter p.o. daily. 3. Lovastatin 20 mg p.o. every night. 4. Multivitamin p.o. daily. 5. MacularProtect Complete over the counter daily. 6. Tylenol 500 mg daily. 7. Avodart 0.5 mg daily. 8. Tramadol 50 mg p.o. 2-3 times a day as needed. 9. Aggrenox 25/200 mg p.o. b.i.d. 10.MiraLax 17  g p.o. daily for constipation.  DRUG ALLERGIES: 1. AMITRIPTYLINE. 2. AMLODIPINE. 3. ADHESIVE TAPES.  PHYSICAL EXAMINATION:  The patient was seen and evaluated in his hospital bed.  He had a sling on his right upper extremity.  He had obvious aphasia.  Cognitively, he is aware of his surroundings, but has expressive aphasia.  He otherwise has stable vital signs.  His for extremity has palpable pulses.  He has pain with movement.  He has no other deformities or bruising.  He has no cuts or lacerations.  Radiographs of his right humerus reveal a long oblique fracture of the proximal humerus extending from the head and neck junction down to the midportion of shaft with some displacement and angulation.  ASSESSMENT: 1. Closed displaced long oblique fracture involving the right proximal     humerus. 2. Right hemiparesis following cerebrovascular accident.  PLAN:  I reviewed with [name] [name] and his wife who is dedicated to the care of her husband and has been since 1995.  The current treatment will be nonoperative.  I recommend consult with [employer] here in town to be fit with a Sarmiento clamshell brace to limit the mobility of the fracture as well as the use of a sling for comfort.  Combination of these ought to provide adequate stabilization  for this fracture left for healing in this limited functional upper extremity.  Physical Therapy should be consulted for evaluation and assistance with activity with home health physical therapy of benefit.  Followup in the Orthopedic Office at 2-week interval for fracture healing would be of benefit.  If his wife feels that she is able to care for him, then discharge home should be adequate.  Questions were encouraged and answered, reviewed with him.     Madlyn Frankel Charlann Boxer, M.D.     MDO/MEDQ  D:  11/03/2010  T:  11/04/2010  Job:  [id number]  Electronically Signed by Durene Romans M.D. on 11/06/2010 08:57:03 AM

## 2010-11-14 NOTE — Discharge Summary (Signed)
[name] [name], [name] [name]             ACCOUNT NO.:  [id number]  MEDICAL RECORD NO.:  [id number]           PATIENT TYPE:  I  LOCATION:  5003                         FACILITY:  [facility]  PHYSICIAN:  Leighton Roach Kynnedy Carreno, M.D.DATE OF BIRTH:  09/12/1934  DATE OF ADMISSION:  11/03/2010 DATE OF DISCHARGE:  11/06/2010                              DISCHARGE SUMMARY   ADMISSION DIAGNOSES: 1. Right humerus fracture. 2. History of cerebrovascular accident. 3. Hyperlipidemia. 4. Urinary incontinence.  DISCHARGE DIAGNOSES: 1. Right humerus fracture, now status post reduction. 2. History of cerebrovascular accident. 3. Hyperlipidemia. 4. Urinary incontinence.  DISCHARGE CONDITION:  Stable.  CONSULTANTS:  Ortho, Dr. Charlann Boxer.  PROCEDURES:  None.  PERTINENT LABS:  On discharge sodium 140, potassium 4.4, glucose 122, creatinine 0.84.  CBC showed white blood cell 12.1, hemoglobin 13.1, platelets 237.  IMAGING RADIOGRAPHY:  CT of head without contrast showed no acute intracranial pathology, showed left frontoparietal region, remote infarct, diffuse small vessel ischemic microangiopathy, white matter hypoattenuation throughout the left cerebral hemisphere reflecting the remote infarct and also showed small chronic lacunar infarcts in the right posterior corona radiata.  X-ray of the right humerus showed oblique fracture throughout the mid shaft, humeral diaphysis with lateral displacement override.  HOSPITAL COURSE:  The patient is a 76 year old man who was walking in his home with his cane when he lost balance and fell.  The fall was not witnessed.  His wife did hear him fall and was at his side, but then seconds of the fall.  There was no loss of consciousness, post fall. The patient reported pain in his right arm.  The patient was transported by EMS to the emergency department.  X-rays of right arm showed right humerus fracture.  The patient had significant pain and was given Dilaudid in the  emergency department.  The patient was admitted to the Medicine Service and Ortho was consulted for evaluation of fracture. 1. Right humerus fracture.  The patient is seen by orthopedics during     hospital stay.  Ortho recommended the patient to be fitted with     Sarmiento clamshell brace to limit the mobility of the fracture as     well as the use of sling for comfort.  Physical therapy was     consulted during hospital stay for evaluation and assistance of     activity as well as recommendations for home health and placement.     Ortho recommended 2-week interval for fracture healing with follow     up in orthopedic office.  Pain well controlled with oxycodone SR     for baseline pain relief as well as Tylenol scheduled, Ultram     scheduled as per home regimen and Percocet p.r.n. for breakthrough     pain. 2. History of CVA.  The patient at baseline per wife, he has residual     right-sided weakness from his past CVA.  No changes in neuro exam     during hospital stay.  We will continue Aggrenox at time of    transfer. 3. Urinary incontinence.  Patient is on Embolex and Avodart per home  regimen.  We will continue these at time of discharge. 4. Hyperlipidemia.  Patient is on Zocor for this issue.  We will     continue this at the time of transfer. 5. Activity per physical therapy and occupational therapy     recommendations.  The patient needs max assist.  The patient will     be transferred to SNF for rehab.  DIET:  Regular diet.  MEDICATIONS: 1. Avodart 0.5 mg p.o. 1 cap daily. 2. Tylenol 600 mg p.o. b.i.d. 3. MiraLax one capful p.o. daily p.r.n. for constipation. 4. Solifenacin 10 mg 1 tablet daily. 5. Lovastatin 20 mg p.o. daily. 6. MacularProtect Complete p.o. 1 tablet daily. 7. Multivitamin 1 tablet daily. 8. Tramadol 50 mg p.o. t.i.d. 9. Percocet 5/325 1 tablet q.4 h. p.r.n. 10.Oxycodone 20 mg SR q.12 h. scheduled. 11.Calcium 1 tab daily. 12.Aggrenox 25/200 1  tablet b.i.d.  FOLLOWUP:  The patient will be transferred to SNF for rehab.  The patient is to follow up with Ortho in 2 weeks.  The patient will be followed by MD at rehab facility for medical issues.     Ellin Mayhew, MD   ______________________________ Etta Grandchild, M.D.    DC/MEDQ  D:  11/06/2010  T:  11/06/2010  Job:  [id number]  Electronically Signed by Ellin Mayhew  on 11/12/2010 10:02:13 AM Electronically Signed by Acquanetta Belling M.D. on 11/14/2010 01:21:53 PM

## 2010-11-19 NOTE — H&P (Signed)
NAMEALDRICK, DERRIG             ACCOUNT NO.:  [id number]  MEDICAL RECORD NO.:  [id number]           PATIENT TYPE:  I  LOCATION:  5003                         FACILITY:  [facility]  PHYSICIAN:  Terrin Imparato A. Sheffield Slider, M.D.    DATE OF BIRTH:  [birth year]  DATE OF ADMISSION:  11/03/2010 DATE OF DISCHARGE:                             HISTORY & PHYSICAL   PRIMARY CARE PHYSICIAN:  Unassigned.  CHIEF COMPLAINT:  Right humerus fracture.  HISTORY OF PRESENT ILLNESS:  The patient is a 76 year old man who was walking in home with cane when he lost balance and fell.  The patient's fall was not witnessed.  Wife had her back to the patient during fall and turned around to find him on the floor after hearing the fall.  The patient was unable to get up by self, EMS was called for assistance, immediately complained of right arm pain.  No other pain.  Denies trauma to head with fall.  See review of systems below.  No loss of consciousness.  History obtained from wife.  The patient has aphasia at baseline from previous stroke.  ALLERGIES:  NORVASC gives rash, AMITRIPTYLINE causes drowsiness and depression.  MEDICATIONS: 1. Solifenacin 10 mg p.o. daily. 2. Calcium 1 tablet daily. 3. Lovastatin 20 mg 1 tablet daily at bedtime. 4. MacularProtect Complete OTC 1 tablet daily. 5. Multivitamin OTC 1 tablet daily. 6. Acetaminophen 500 mg p.o. 1 tablet daily as needed. 7. Avodart 0.5 mg p.o. daily. 8. Tramadol 50 mg 1 tablet t.i.d. 9. Aggrenox 25/200 p.o. 1 tablet b.i.d. 10.MiraLax 17 g daily p.o.  PAST MEDICAL HISTORY: 1. CVA in 2006, has baseline deficit of right-sided weakness, left     carotid occlusion in circle of Willis where the areas that he has     had strokes, and the patient has a right carotid 95% block that had     surgical repair with endarterectomy. 2. History of abdominal aortic aneurysm stable currently. 3. Kidney stones. 4. Cluster headaches. 5. Head injury from fall in 2006.  PAST  SURGICAL HISTORY: 1. Right carotid endarterectomy. 2. Hand surgery of right hand. 3. Hernia repair. 4. Tonsillectomy. 5. Hypercholesterolemia.  SOCIAL HISTORY:  The patient lives with wife.  He is a retired Financial controller.  He quit smoking tobacco 10 years ago.  He denies alcohol.  Wife states he does not use drugs.  FAMILY HISTORY:  Mother MI.  Father unknown.  Siblings, breast cancer, CVA, heart problems, and prostate cancer.  REVIEW OF SYSTEMS:  Positive for chronic constipation and easy bruising. Negative for fevers.  No chills.  No sweats.  No headaches.  No chest pain.  No nausea, vomiting, or diarrhea.  No rash.  PHYSICAL EXAMINATION:  VITAL SIGNS:  Temperature 97.2, pulse 89, respiratory rate 18, blood pressure 162/91, pulse ox 92% on room air. GENERAL:  In no acute distress, resting in bed. HEENT:  Normocephalic, atraumatic. CARDIOVASCULAR:  Regular rate and rhythm.  No murmurs, rubs, or gallops. LUNGS:  Clear to auscultation bilateral.  No wheezing. ABDOMEN:  Soft, nontender, nondistended. EXTREMITIES:  No lower extremity edema. NEURO:  Moving all  four extremities, alert and oriented to person, difficult to assess secondary to aphasia MSK:  Right arm wrapped in sling per ortho.  Positive radial pulse. Positive small abrasion on back of right hand.  LABORATORY DATA AND STUDIES:  White blood cells 12.2, hemoglobin 14.1. Sodium 135, potassium 4.4, creatinine 1.2, glucose 138.  PT 12.3, PTT 24, INR 0.89.  Right arm film, oblique fracture through midshaft, humeral diaphysis with lateral displacement and override.  Postreduction right arm film showed improvement in alignment after reduction of right humeral fracture.  ASSESSMENT AND PLAN:  A 76 year old man with right humerus fracture, now status post resection. 1. Right humerus fracture now status post reduction.  The patient seen     by Orthopedics on arrival to floor.  Dictated consult note  pending.     We will follow record, Dilaudid for pain, cast wrap with sling in     place. 2. History of cerebrovascular accident.  History difficult to obtain     secondary to aphasia.  Wife is the source information, states that     the patient's right side deficits are his baseline.  Aphasia is at     baseline. 3. Status post fall per wife.  The patient simply took a miss step and     off balance.  We will ask PT/OT to evaluate and give     recommendations. 4. Hyperlipidemia.  Continue lovastatin per home regimen. 5. Urinary incontinence.  Continue Solifenacin and Avodart per home     regimen. 6. Fluids, electrolytes, nutrition, gastrointestinal.  Regular diet.     We will check with wife to make sure no     restriction secondary to the dysphagia. 7  Prophylaxis on Aggrenox. 1. Disposition.  Pending clinical improvement.     Ellin Mayhew, MD   ______________________________ Arnette Norris. Sheffield Slider, M.D.    DC/MEDQ  D:  11/04/2010  T:  11/04/2010  Job:  [id number]  Electronically Signed by Ellin Mayhew  on 11/12/2010 10:01:57 AM Electronically Signed by Zachery Dauer M.D. on 11/19/2010 07:22:56 AM

## 2010-12-18 ENCOUNTER — Encounter (INDEPENDENT_AMBULATORY_CARE_PROVIDER_SITE_OTHER): Payer: Medicare Other

## 2010-12-18 ENCOUNTER — Other Ambulatory Visit (INDEPENDENT_AMBULATORY_CARE_PROVIDER_SITE_OTHER): Payer: Medicare Other

## 2010-12-18 ENCOUNTER — Ambulatory Visit (INDEPENDENT_AMBULATORY_CARE_PROVIDER_SITE_OTHER): Payer: Medicare Other | Admitting: Vascular Surgery

## 2010-12-18 DIAGNOSIS — I714 Abdominal aortic aneurysm, without rupture: Secondary | ICD-10-CM

## 2010-12-18 DIAGNOSIS — I6529 Occlusion and stenosis of unspecified carotid artery: Secondary | ICD-10-CM

## 2010-12-18 DIAGNOSIS — Z48812 Encounter for surgical aftercare following surgery on the circulatory system: Secondary | ICD-10-CM

## 2010-12-18 NOTE — Consult Note (Signed)
NEW PATIENT CONSULTATION  [name] [name], [name] [name] DOB:  1934-09-07                                       12/18/2010 ZOXWR#:[id number]  The patient is a 76 year old male patient seen today for long-term followup of his abdominal aortic aneurysm and right carotid disease which was treated previously with endarterectomy 17 years ago.  He has a known left internal carotid occlusion and a previous left brain stroke many years ago.  He has had no right brain symptoms such as left body weakness or otherwise.  He did fall in May and fractured his right humerus which was treated by Dr. Lajoyce Corners.  He has also had a stroke in his circle of Willis in 2006.  He currently has chronic paralysis and weakness of his right lower extremity and is in a wheelchair.  CHRONIC MEDICAL PROBLEMS: 1. Hypertension. 2. Hyperlipidemia. 3. Abdominal aortic aneurysm. 4. History of tobacco abuse remote. 5. History of Bell's palsy on the left. 6. Negative for coronary artery disease.  SOCIAL HISTORY:  He is married, has 1 child, is disabled.  Has not smoked in 10 years.  Does not use alcohol.  FAMILY HISTORY:  Negative for coronary artery disease, diabetes and stroke.  REVIEW OF SYSTEMS:  Positive for weight loss, inability to ambulate with paralysis right leg from left brain stroke, arthritis.  Other problems such as his recent right upper arm fracture.  All other systems are negative in complete review of systems.  PHYSICAL EXAM:  Vital signs:  Blood pressure 162/77, heart rate 96, respirations 18.  General:  He is an elderly male who is in no apparent distress, well-developed and well-nourished.  HEENT:  Exam normal for age.  EOMs intact.  Lungs:  Clear to auscultation.  No rhonchi or wheezing.  Cardiovascular:  Regular rhythm.  No murmurs.  Carotid pulses 3+ on the right and absent on the left.  No bruit is audible.  Abdomen: Soft, nontender with no masses.  Musculoskeletal:  Exam  reveals tenderness in the right upper arm where the previous fracture is healing with a little movement in this area.  Neurologic:  Reveals paralysis of the right upper and lower extremities, good strength on the left side.  Today I ordered a duplex scan of his aortic aneurysm which I reviewed and interpreted.  The aneurysm is 4.2 cm in maximum diameter which is stable.  I also ordered carotid duplex exam on the right which I reviewed and interpreted.  He has no evidence of restenosis at the endarterectomy site on the right.  He has a known left ICA occlusion.  I reassured the family regarding these findings.  We will see him back in 1 year to monitor his aneurysm size and his carotid artery on the right.    Quita Skye Hart [place], M.D. Electronically Signed  JDL/MEDQ  D:  12/18/2010  T:  12/18/2010  Job:  [id number]  cc:   Dr Arnell Asal

## 2010-12-26 NOTE — Procedures (Unsigned)
DUPLEX ULTRASOUND OF ABDOMINAL AORTA  INDICATION:  Abdominal aortic aneurysm.  HISTORY: Diabetes:  No. Cardiac:  No. Hypertension:  No. Smoking:  No. Connective Tissue Disorder: Family History:  No. Previous Surgery:  No.  DUPLEX EXAM:         AP (cm)                   TRANSVERSE (cm) Proximal             2.7 cm                    2.6 cm Mid                  2.6 cm                    2.6 cm Distal               4.2 cm                    4.1 cm Right Iliac          1.0 cm                    1.2 cm Left Iliac           1.1 cm                    1.2 cm  PREVIOUS:  Date:  12/22/2009  AP:  3.8  TRANSVERSE:  4.0  IMPRESSION: 1. Aneurysmal dilatation of the distal abdominal aorta with mural     thrombus noted. 2. Stable maximum diameter measurements of the abdominal aortic     aneurysm noted when compared to the previous exam. 3. Velocity of 205 cm/s noted in the left common iliac artery.  ___________________________________________ Quita Skye Hart [place], M.D.  CH/MEDQ  D:  12/18/2010  T:  12/18/2010  Job:  [id number]

## 2010-12-26 NOTE — Procedures (Unsigned)
CAROTID DUPLEX EXAM  INDICATION:  Right carotid endarterectomy.  HISTORY: Diabetes:  No. Cardiac:  No. Hypertension:  No. Smoking:  No. Previous Surgery: CV History:  History of CVA x2. Amaurosis Fugax No, Paresthesias No, Hemiparesis No                                      RIGHT             LEFT Brachial systolic pressure: Brachial Doppler waveforms: Vertebral direction of flow:        Antegrade DUPLEX VELOCITIES (cm/sec) CCA peak systolic                   39 ECA peak systolic                   132 ICA peak systolic                   46 ICA end diastolic                   13 PLAQUE MORPHOLOGY:                  Heterogeneous PLAQUE AMOUNT:                      Mild PLAQUE LOCATION:                    ICA/CCA  IMPRESSION: 1. Patent right carotid endarterectomy site with no hemodynamically     significant stenosis. 2. Known left internal carotid artery occlusion. 3. No significant change noted when compared to the previous exam on     12/22/2009.  ___________________________________________ Quita Skye. Hart [place], M.D.  CH/MEDQ  D:  12/18/2010  T:  12/18/2010  Job:  [id number]

## 2011-10-27 ENCOUNTER — Emergency Department (HOSPITAL_COMMUNITY): Payer: Medicare Other

## 2011-10-27 ENCOUNTER — Inpatient Hospital Stay (HOSPITAL_COMMUNITY)
Admission: EM | Admit: 2011-10-27 | Discharge: 2011-10-31 | DRG: 470 | Disposition: A | Discharge status: Skilled Nursing Facility | Payer: Medicare Other | Attending: Orthopedic Surgery | Admitting: Orthopedic Surgery

## 2011-10-27 DIAGNOSIS — Z8673 Personal history of transient ischemic attack (TIA), and cerebral infarction without residual deficits: Secondary | ICD-10-CM

## 2011-10-27 DIAGNOSIS — Z9181 History of falling: Secondary | ICD-10-CM

## 2011-10-27 DIAGNOSIS — R296 Repeated falls: Secondary | ICD-10-CM | POA: Diagnosis present

## 2011-10-27 DIAGNOSIS — Y92009 Unspecified place in unspecified non-institutional (private) residence as the place of occurrence of the external cause: Secondary | ICD-10-CM

## 2011-10-27 DIAGNOSIS — Z79899 Other long term (current) drug therapy: Secondary | ICD-10-CM

## 2011-10-27 DIAGNOSIS — I4510 Unspecified right bundle-branch block: Secondary | ICD-10-CM | POA: Diagnosis present

## 2011-10-27 DIAGNOSIS — I714 Abdominal aortic aneurysm, without rupture, unspecified: Secondary | ICD-10-CM | POA: Diagnosis present

## 2011-10-27 DIAGNOSIS — I69920 Aphasia following unspecified cerebrovascular disease: Secondary | ICD-10-CM

## 2011-10-27 DIAGNOSIS — Z8781 Personal history of (healed) traumatic fracture: Secondary | ICD-10-CM

## 2011-10-27 DIAGNOSIS — Z96649 Presence of unspecified artificial hip joint: Secondary | ICD-10-CM

## 2011-10-27 DIAGNOSIS — E785 Hyperlipidemia, unspecified: Secondary | ICD-10-CM

## 2011-10-27 DIAGNOSIS — Y998 Other external cause status: Secondary | ICD-10-CM

## 2011-10-27 DIAGNOSIS — I10 Essential (primary) hypertension: Secondary | ICD-10-CM | POA: Diagnosis present

## 2011-10-27 DIAGNOSIS — S72009A Fracture of unspecified part of neck of unspecified femur, initial encounter for closed fracture: Principal | ICD-10-CM

## 2011-10-27 DIAGNOSIS — I69959 Hemiplegia and hemiparesis following unspecified cerebrovascular disease affecting unspecified side: Secondary | ICD-10-CM

## 2011-10-27 DIAGNOSIS — I2510 Atherosclerotic heart disease of native coronary artery without angina pectoris: Secondary | ICD-10-CM | POA: Diagnosis present

## 2011-10-27 HISTORY — DX: Cerebral infarction, unspecified: I63.9

## 2011-10-27 HISTORY — DX: Angina pectoris, unspecified: I20.9

## 2011-10-27 HISTORY — DX: Abdominal aortic aneurysm, without rupture, unspecified: I71.40

## 2011-10-27 HISTORY — DX: Myocardial degeneration: I51.5

## 2011-10-27 HISTORY — DX: Headache: R51

## 2011-10-27 HISTORY — DX: Essential (primary) hypertension: I10

## 2011-10-27 HISTORY — DX: Anemia, unspecified: D64.9

## 2011-10-27 HISTORY — DX: Abdominal aortic aneurysm, without rupture: I71.4

## 2011-10-27 HISTORY — DX: Unspecified osteoarthritis, unspecified site: M19.90

## 2011-10-27 HISTORY — DX: Atherosclerotic heart disease of native coronary artery without angina pectoris: I25.10

## 2011-10-27 LAB — PROTIME-INR: INR: 1.14 (ref 0.00–1.49)

## 2011-10-27 LAB — BASIC METABOLIC PANEL
CO2: 24 mEq/L (ref 19–32)
Glucose, Bld: 121 mg/dL — ABNORMAL HIGH (ref 70–99)
Potassium: 3.8 mEq/L (ref 3.5–5.1)
Sodium: 134 mEq/L — ABNORMAL LOW (ref 135–145)

## 2011-10-27 LAB — CBC
MCV: 90 fL (ref 78.0–100.0)
Platelets: 212 10*3/uL (ref 150–400)

## 2011-10-27 LAB — DIFFERENTIAL
Lymphocytes Relative: 10 % — ABNORMAL LOW (ref 12–46)
Lymphs Abs: 0.9 10*3/uL (ref 0.7–4.0)
Neutrophils Relative %: 76 % (ref 43–77)

## 2011-10-27 MED ORDER — MORPHINE SULFATE 4 MG/ML IJ SOLN
4.0000 mg | Freq: Once | INTRAMUSCULAR | Status: AC
  Administered 2011-10-27: 4 mg via INTRAVENOUS
  Filled 2011-10-27: qty 1

## 2011-10-27 MED ORDER — ASPIRIN-DIPYRIDAMOLE ER 25-200 MG PO CP12: 1.0000 | ORAL_CAPSULE | Freq: Two times a day (BID) | ORAL | Status: DC

## 2011-10-27 MED ORDER — AZITHROMYCIN 500 MG IV SOLR
500.0000 mg | Freq: Once | INTRAVENOUS | Status: AC
  Administered 2011-10-27: 500 mg via INTRAVENOUS
  Filled 2011-10-27: qty 500

## 2011-10-27 MED ORDER — DEXTROSE 5 % IV SOLN
1.0000 g | Freq: Once | INTRAVENOUS | Status: AC
  Administered 2011-10-27: 1 g via INTRAVENOUS
  Filled 2011-10-27: qty 10

## 2011-10-27 NOTE — ED Provider Notes (Signed)
History     CSN: [id number]  Arrival date & time 10/27/11  1653   First MD Initiated Contact with Patient 10/27/11 1701      Chief Complaint  Patient presents with  . Fall  . Hip Pain  . Diarrhea    (Consider location/radiation/quality/duration/timing/severity/associated sxs/prior treatment) HPI Comments: Patient presents today with a chief complaint of right hip pain after a fall just prior to arrival.  Patient and wife report that patient began having diarrhea today.  While he was in the bathroom he was holding on to the door for stability and the door swung open and he then fell to the ground and landed on his right side.  He did not hit his head.  NO LOC.  He is currently having pain of the upper right arm and the right hip.  He has not been ambulatory since the fall.  He is currently on daily Aggrenox.  PMH significant for prior CVA x 2 in 1995 and 2006.  He has some residual right sided weakness and expressive aphasia from prior CVA.  He has also had a prior fracture of the right humerus last year.  His PCP is in [place].  He currently lives in [place] part time and part time in [place].  Patient is a 77 y.o. male presenting with fall, hip pain, and diarrhea. The history is provided by the patient and the spouse.  Fall Pertinent negatives include no fever, no abdominal pain, no nausea and no vomiting.  Hip Pain Pertinent negatives include no abdominal pain, chest pain, chills, coughing, fever, nausea, neck pain or vomiting.  Diarrhea The primary symptoms include diarrhea. Primary symptoms do not include fever, abdominal pain, nausea, vomiting or dysuria.  The illness does not include chills or back pain.    No past medical history on file.  No past surgical history on file.  No family history on file.  History  Substance Use Topics  . Smoking status: Not on file  . Smokeless tobacco: Not on file  . Alcohol Use: Not on file      Review of Systems    Constitutional: Negative for fever and chills.  HENT: Negative for neck pain and neck stiffness.   Respiratory: Negative for cough and shortness of breath.   Cardiovascular: Negative for chest pain.  Gastrointestinal: Positive for diarrhea. Negative for nausea, vomiting and abdominal pain.  Genitourinary: Negative for dysuria.  Musculoskeletal: Negative for back pain.       Right hip pain  Neurological: Negative for dizziness, syncope and light-headedness.  Psychiatric/Behavioral: Negative for confusion.    Allergies  Review of patient's allergies indicates no known allergies.  Home Medications   Current Outpatient Rx  Name Route Sig Dispense Refill  . ACETAMINOPHEN 500 MG PO TABS Oral Take 500 mg by mouth every 8 (eight) hours.    . ASPIRIN-DIPYRIDAMOLE ER 25-200 MG PO CP12 Oral Take 1 capsule by mouth 2 (two) times daily.    . DUTASTERIDE 0.5 MG PO CAPS Oral Take 0.5 mg by mouth daily.    Marland Kitchen OVER THE COUNTER MEDICATION Oral Take 1 capsule by mouth daily. Macular Protect Complete-S    . POLYETHYLENE GLYCOL 3350 PO PACK Oral Take 17 g by mouth at bedtime.    . SOLIFENACIN SUCCINATE 10 MG PO TABS Oral Take 5 mg by mouth daily.    . TRAMADOL HCL 50 MG PO TABS Oral Take 50 mg by mouth every 8 (eight) hours as needed.  BP 157/63  Pulse 88  Temp(Src) 98.2 F (36.8 C) (Oral)  Resp 18  SpO2 93%  Physical Exam  Nursing note and vitals reviewed. Constitutional: He appears well-developed and well-nourished. He appears distressed.       Uncomfortable appearing  HENT:  Head: Normocephalic and atraumatic.  Mouth/Throat: Oropharynx is clear and moist.  Eyes: EOM are normal. Pupils are equal, round, and reactive to light.  Neck: Normal range of motion. Neck supple.  Cardiovascular: Normal rate, regular rhythm, normal heart sounds and intact distal pulses.        Doppler was used and patient has good Femoral pulse and strong DP pulse.  Pulmonary/Chest: Effort normal and breath  sounds normal. No respiratory distress. He has no wheezes. He has no rales. He exhibits tenderness.  Abdominal: Soft. He exhibits no distension. There is no tenderness.  Neurological: He is alert.  Skin: Skin is warm, dry and intact. Bruising noted. No laceration noted. He is not diaphoretic.     Psychiatric: He has a normal mood and affect.    ED Course  Procedures (including critical care time)   Labs Reviewed  PROTIME-INR  CBC  DIFFERENTIAL  BASIC METABOLIC PANEL   No results found.   No diagnosis found.  8:02 PM Discussed with Dr. Magnus Ivan with Orthopedic Surgery.  He is recommending having the medicine service admit the patient and will see the patient.   Date: 10/27/2011  Rate: 91  Rhythm: normal sinus rhythm  QRS Axis: normal  Intervals: normal  ST/T Wave abnormalities: normal  Conduction Disutrbances:none  Narrative Interpretation:   Old EKG Reviewed: unchanged  8:57 PM Discussed with [facility].  Patient is requesting Dr. Charlann Boxer.  They report that they will see patient in the morning.  They recommend keeping the patient NPO after midnight. 9:43 PM Discussed with Family Practice.  They report that they will come down to the ED to see patient. MDM  Patient with right femoral fracture.  Patient neurovascularly intact.  Patient currently on daily Aggrenox.  Head CT negative.  Orthopedics consulted and report that patient will probably have surgery done tomorrow.  Patient admitted to medicine service for management prior to surgery.        Pascal Lux [place], PA-C 10/28/11 2351

## 2011-10-27 NOTE — ED Notes (Signed)
Called and gave report to [place].

## 2011-10-27 NOTE — H&P (Signed)
Family Medicine Teaching [facility] Admission History and Physical  Patient name: [name] [name] Medical record number: [id number] Date of birth: 1934/07/30 Age: 77 y.o. Gender: male  Primary Care Provider: Dr. Felix Pacini in [facility]   Chief Complaint: Fall  History of Present Illness: [name] [name] is a 77 y.o. year old male presenting with fall and R femur fracture. Pt and wife came home when pt had episode of diarrhea. Was unable to make it to bathroom. Pts wife was attempting to clean up pt and had him hold onto an open closet door with his right hand. The door swung out and he fell on his right side. Pt brought to ED. Pt w/ 3 day h/o N/V prior to event. Dramamine w/ relief of symptoms.   Of note pt w/ CVA x2 in 2005 and 2006 resulting in some R sided muscle weakness and expressive aphasia. Pt on Aggrenox since that time. Pt w/ multiple previous falls and fractures including humorous and spine. Placed on calcium supplement last year by PCP. Pt reports one episode of CP 2 wks ago that was relieved by one chewable ASA.  ED Course: Multiple imaging modalities showing R femur neck fracture, and multiple other healing fractures, negative intracranial hemorrhage.    Review Of Systems: Per HPI with the following additions:  Negative: HA, vision change, sensory change, hematochezia, hematemesis, cough, SOB,   Patient Active Problem List  Diagnoses  . HLD (hyperlipidemia)  . History of CVA (cerebrovascular accident)   Past Medical History: Per HPI  Past Surgical History: Reviewed and pertinent positives include multiple previous fracture repairs  Social History: History   Social History  . Marital Status: Married    Spouse Name: N/A    Number of Children: N/A  . Years of Education: N/A   Social History Main Topics  . Smoking status: Not on file  . Smokeless tobacco: Not on file  . Alcohol Use: Not on file  . Drug Use: Not on file  . Sexually Active: Not on file   Other  Topics Concern  . Not on file   Social History Narrative  . No narrative on file    Family History: No family history on file.  Allergies: No Known Allergies  Current Facility-Administered Medications  Medication Dose Route Frequency Provider Last Rate Last Dose  . azithromycin (ZITHROMAX) 500 mg in dextrose 5 % 250 mL IVPB  500 mg Intravenous Once [employer], PA-C   500 mg at 10/27/11 2105  . cefTRIAXone (ROCEPHIN) 1 g in dextrose 5 % 50 mL IVPB  1 g Intravenous Once [employer], PA-C      . morphine 4 MG/ML injection 4 mg  4 mg Intravenous Once Pascal Lux Wingen, PA-C   4 mg at 10/27/11 1942   Current Outpatient Prescriptions  Medication Sig Dispense Refill  . acetaminophen (TYLENOL) 500 MG tablet Take 500 mg by mouth every 8 (eight) hours.      . dipyridamole-aspirin (AGGRENOX) 25-200 MG per 12 hr capsule Take 1 capsule by mouth 2 (two) times daily.      Marland Kitchen dutasteride (AVODART) 0.5 MG capsule Take 0.5 mg by mouth daily.      Marland Kitchen OVER THE COUNTER MEDICATION Take 1 capsule by mouth daily. Macular Protect Complete-S      . polyethylene glycol (MIRALAX / GLYCOLAX) packet Take 17 g by mouth at bedtime.      . solifenacin (VESICARE) 10 MG tablet Take 5 mg by mouth daily.      Marland Kitchen  traMADol (ULTRAM) 50 MG tablet Take 50 mg by mouth every 8 (eight) hours as needed.         Physical Exam: Filed Vitals:   10/27/11 1931  BP: 156/78  Pulse: 93  Temp: 98.7 F (37.1 C)  Resp: 14   General: alert, cooperative and mild distress HEENT: extra ocular movement intact Heart: RRR no m/r/g Lungs: clear to auscultation anteriorly and no wheezes or rales Abdomen: abdomen is soft without significant tenderness, masses, organomegaly or guarding Extremities/Musc: 2+ pulses throughout , minimal contracture of UE.  Skin:Multiple ecchymoses of the UE, no rash Neurology: Aphasic, follows commands.    Labs and Imaging: Lab Results  Component Value Date/Time   NA 134* 10/27/2011  5:40  PM   K 3.8 10/27/2011  5:40 PM   CL 102 10/27/2011  5:40 PM   CO2 24 10/27/2011  5:40 PM   BUN 16 10/27/2011  5:40 PM   CREATININE 0.96 10/27/2011  5:40 PM   GLUCOSE 121* 10/27/2011  5:40 PM   Lab Results  Component Value Date   WBC 8.2 10/27/2011   HGB 12.0* 10/27/2011   HCT 37.1* 10/27/2011   MCV 90.0 10/27/2011   PLT 212 10/27/2011   Head CT: IMPRESSION: No acute intracranial abnormalities. Multiple old strokes.  Hip Xray: IMPRESSION: Right femoral neck fracture.  CXR: 1. Interstitial edema pattern versus atypical pneumonia.  2. Cystic change within the left upper lobe. Recommend non  emergent CT thorax with contrast for evaluation.  3. No evidence right rib fracture or pneumothorax.  4. Potential sclerotic lesion in the right lateral eighth rib.  Recommend evaluation with CT as above.     Assessment and Plan: [name] [name] is a 77 y.o. year old male presenting with R Femur fracture secondary to mechanical loss of balance, w/ h/o CVA and multiple other fractures.   1. R Femur Fracture: Ortho consulted, and planning on surgery in the morning - PT/OT - +/- Home eval due to mutliple prior falls and fractures - Moprphine 1-2mg  Q3PRN for pain - NPO after midnight  2. FEN/GI: Regular diet until NPO - monitor for diarrhea - NS 52ml/hr  3. ID/Res: Questionable pneumonia on CXR. H/o emesis concerning for aspiration PNA vs CAP. O2 Sats at 87% in ED x1 but not requiring O2 - Continue Azithro, Rocephin - O2 as needed.  4. Prophylaxis: SCD - Hold Aggrenox until after surgery  5. Disposition: Pending surgery and improvement  Signed: Sukhmani Fetherolf, M.D. Family Medicine Resident PGY-1 [phone] 10/27/2011 9:52 PM

## 2011-10-27 NOTE — ED Notes (Signed)
Pt remains in [facility]

## 2011-10-27 NOTE — ED Notes (Signed)
Per Dr. Manus Gunning, the patient is no longer NPO.

## 2011-10-27 NOTE — ED Notes (Signed)
Pt slipped and fell while going to restroom for diarrhea today complains of right hip and arm pain given 350 mcg of fentanyl by ems and still rates pain 8/10

## 2011-10-27 NOTE — ED Notes (Signed)
Took doppler to the bedside per PA's request.

## 2011-10-27 NOTE — ED Notes (Signed)
Pt on stretcher, nad noted, abc intact, will monitor pt while awaiting furthur ordes.

## 2011-10-28 ENCOUNTER — Encounter (HOSPITAL_COMMUNITY): Payer: Self-pay

## 2011-10-28 DIAGNOSIS — S72009A Fracture of unspecified part of neck of unspecified femur, initial encounter for closed fracture: Secondary | ICD-10-CM

## 2011-10-28 LAB — CBC
HCT: 37.3 % — ABNORMAL LOW (ref 39.0–52.0)
Hemoglobin: 12 g/dL — ABNORMAL LOW (ref 13.0–17.0)
MCHC: 32.2 g/dL (ref 30.0–36.0)
MCV: 89.7 fL (ref 78.0–100.0)
WBC: 9.7 10*3/uL (ref 4.0–10.5)

## 2011-10-28 LAB — GLUCOSE, CAPILLARY: Glucose-Capillary: 99 mg/dL (ref 70–99)

## 2011-10-28 LAB — BASIC METABOLIC PANEL
BUN: 10 mg/dL (ref 6–23)
CO2: 24 mEq/L (ref 19–32)
Chloride: 101 mEq/L (ref 96–112)
Creatinine, Ser: 0.88 mg/dL (ref 0.50–1.35)
GFR calc Af Amer: >90 mL/min (ref >90)
Potassium: 3.4 mEq/L — ABNORMAL LOW (ref 3.5–5.1)

## 2011-10-28 MED ORDER — MORPHINE SULFATE 2 MG/ML IJ SOLN
1.0000 mg | INTRAMUSCULAR | Status: DC | PRN
  Administered 2011-10-28 – 2011-10-29 (×3): 1 mg via INTRAVENOUS
  Filled 2011-10-28 (×3): qty 1

## 2011-10-28 MED ORDER — DARIFENACIN HYDROBROMIDE ER 7.5 MG PO TB24
7.5000 mg | ORAL_TABLET | Freq: Every day | ORAL | Status: DC
  Administered 2011-10-28 – 2011-10-29 (×2): 7.5 mg via ORAL
  Filled 2011-10-28 (×2): qty 1

## 2011-10-28 MED ORDER — DUTASTERIDE 0.5 MG PO CAPS
0.5000 mg | ORAL_CAPSULE | Freq: Every day | ORAL | Status: DC
  Administered 2011-10-28 – 2011-10-29 (×2): 0.5 mg via ORAL
  Filled 2011-10-28 (×2): qty 1

## 2011-10-28 MED ORDER — ACETAMINOPHEN 325 MG PO TABS: 650.0000 mg | ORAL_TABLET | Freq: Four times a day (QID) | ORAL | Status: DC | PRN

## 2011-10-28 MED ORDER — ONDANSETRON HCL 4 MG/2ML IJ SOLN: 4.0000 mg | Freq: Four times a day (QID) | INTRAMUSCULAR | Status: DC | PRN

## 2011-10-28 MED ORDER — POLYETHYLENE GLYCOL 3350 17 G PO PACK: 17.0000 g | PACK | Freq: Every day | ORAL | Status: DC | PRN

## 2011-10-28 MED ORDER — HYDROCODONE-ACETAMINOPHEN 5-325 MG PO TABS
1.0000 | ORAL_TABLET | ORAL | Status: DC | PRN
  Administered 2011-10-28 – 2011-10-29 (×6): 2 via ORAL
  Filled 2011-10-28 (×6): qty 2

## 2011-10-28 MED ORDER — ONDANSETRON HCL 4 MG PO TABS: 4.0000 mg | ORAL_TABLET | Freq: Four times a day (QID) | ORAL | Status: DC | PRN

## 2011-10-28 MED ORDER — SODIUM CHLORIDE 0.9 % IV SOLN
INTRAVENOUS | Status: DC
  Administered 2011-10-28 – 2011-10-29 (×3): via INTRAVENOUS

## 2011-10-28 MED ORDER — ACETAMINOPHEN 650 MG RE SUPP: 650.0000 mg | Freq: Four times a day (QID) | RECTAL | Status: DC | PRN

## 2011-10-28 MED ORDER — MORPHINE SULFATE 2 MG/ML IJ SOLN
1.0000 mg | INTRAMUSCULAR | Status: DC | PRN
  Administered 2011-10-28 (×2): 2 mg via INTRAVENOUS
  Filled 2011-10-28 (×2): qty 1

## 2011-10-28 MED ORDER — TRAMADOL HCL 50 MG PO TABS: 50.0000 mg | ORAL_TABLET | Freq: Three times a day (TID) | ORAL | Status: DC | PRN

## 2011-10-28 MED ORDER — DEXTROSE 5 % IV SOLN
500.0000 mg | Freq: Every day | INTRAVENOUS | Status: DC
  Filled 2011-10-28: qty 500

## 2011-10-28 MED ORDER — CYCLOBENZAPRINE HCL 5 MG PO TABS
5.0000 mg | ORAL_TABLET | Freq: Every day | ORAL | Status: DC
  Administered 2011-10-28: 5 mg via ORAL
  Filled 2011-10-28 (×2): qty 1

## 2011-10-28 NOTE — Progress Notes (Signed)
Family Medicine Teaching Service Attending Note  I interviewed and examined patient [name] [name] and reviewed their tests and x-rays.  I discussed with Dr. Evelena Peat and reviewed their HP for 5-19 and progress note for today.  I agree with their assessment and plan.     Additionally  In moderate pain - increase morphine frequency Await ortho opinion Unfortunately his balance and gait disorder will likely worsen as he ages but will consult phys therapy after surgery  AAA - will increase his risk of surgery.  Unfortunately no other good options.

## 2011-10-28 NOTE — Consult Note (Signed)
Patient name: [name] [name] Medical record number: [id number]  Date of birth: 1935-03-13 Age: 77 y.o. Gender: male   Primary Care Provider: Dr. Felix Pacini in [facility]   Chief Complaint: Fall  History of Present Illness: [name] [name] is a 77 y/o CM with a h/o abdominal aortic aneurysm, h/o CVA x2 (2005, 2006) that resulted in right sided muscle weakness, UE contracture and expressive aphasia taking Aggrenox and h/o multiple falls and fractures, who presented to the ED yesterday after sustaining a fall.  According to his wife the door he was using to steady himself swung open and he was unable to [facility] his balance and fell onto his right side.  During the ED corse he was found to have a right femur fracture and reuested [facility]; specifically Dr. Charlann Boxer for consultation.  Dr. Shon Baton was the physician on call and therefore was asked to see the patient.   Review Of Systems: presently negative for CP, N/V, SOB, cough, fever  Patient Active Problem List   Diagnoses   .  HLD (hyperlipidemia)   .  History of CVA (cerebrovascular accident)    Past Medical History:  Per HPI   Social History:  History    Social History   .  Marital Status:  Married     Spouse Name:  N/A     Number of Children:  N/A   .  Years of Education:  N/A    Social History Main Topics   .  Smoking status:  Not on file   .  Smokeless tobacco:  Not on file   .  Alcohol Use:  Not on file   .  Drug Use:  Not on file   .  Sexually Active:  Not on file    Other Topics  Concern   .  Not on file    Social History Narrative   .  No narrative on file    Family History:  No family history on file.   Allergies:  No Known Allergies   Current Facility-Administered Medications   Medication  Dose  Route  Frequency  Provider  Last Rate  Last Dose   .  azithromycin (ZITHROMAX) 500 mg in dextrose 5 % 250 mL IVPB  500 mg  Intravenous  Once  [employer], PA-C   500 mg at 10/27/11 2105   .  cefTRIAXone  (ROCEPHIN) 1 g in dextrose 5 % 50 mL IVPB  1 g  Intravenous  Once  [employer], PA-C     .  morphine 4 MG/ML injection 4 mg  4 mg  Intravenous  Once  Pascal Lux Wingen, PA-C   4 mg at 10/27/11 1942    Current Outpatient Prescriptions   Medication  Sig  Dispense  Refill   .  acetaminophen (TYLENOL) 500 MG tablet  Take 500 mg by mouth every 8 (eight) hours.     .  dipyridamole-aspirin (AGGRENOX) 25-200 MG per 12 hr capsule  Take 1 capsule by mouth 2 (two) times daily.     Marland Kitchen  dutasteride (AVODART) 0.5 MG capsule  Take 0.5 mg by mouth daily.     Marland Kitchen  OVER THE COUNTER MEDICATION  Take 1 capsule by mouth daily. Macular Protect Complete-S     .  polyethylene glycol (MIRALAX / GLYCOLAX) packet  Take 17 g by mouth at bedtime.     .  solifenacin (VESICARE) 10 MG tablet  Take 5 mg by mouth daily.     Marland Kitchen  traMADol (ULTRAM) 50 MG tablet  Take 50 mg by mouth every 8 (eight) hours as needed.      Radiology: Right hip by report reveals right femoral neck fracture.  Xrays reviewed by Dr. Shon Baton.  Physical exam:  Alert and oriented in NAD with expressive aphasia.  Sensation and peripheral pulses are intact.  Compartments soft and nontender.  Dorsiflexion/plantarflexion intact on the left; minimal on the right (given prior stroke).    Assessment/Plan:   Patient requires medical clearance prior to surgery.  Definitive treatment of hip fracture as per Dr. Charlann Boxer.  I have spoke to Dr. Shon Baton regarding the patients clinical picture and the current plan.  He agrees.   [facility], Conchetta Lamia for Dr. Venita Lick [facility] [phone]

## 2011-10-28 NOTE — Progress Notes (Signed)
PT Cancellation Note  Treatment cancelled today due to patient going to OR for ORIF R femoral fx. Will complete eval when approved by MD post surgery.Marcene Brawn 10/28/2011, 10:15 AM   Lewis Shock, PT, DPT Pager #: [phone] Office #: [phone]

## 2011-10-28 NOTE — Progress Notes (Signed)
OT Cancellation Note  Treatment cancelled today due to medical issues with patient which prohibited therapy. PT scheduled for OR today for ORIF femur fx. Will need new OT order  When appropriate.  [facility] Davia Smyre, OTR/L  [phone] 5/20/20135/20/2013, 11:00 AM

## 2011-10-28 NOTE — Progress Notes (Signed)
Family Medicine Teaching Service [facility] Progress Note  Patient name: [name] [name] Medical record number: [id number] Date of birth: 09-Mar-1935 Age: 77 y.o. Gender: male    LOS: 1 day   Primary Care Provider: Evie Lacks, MD, MD  Overnight Events:  Doing well this morning. Pain well controlled. NPO since midnight. Denies any HA, CP, SOB.  Objective: Vital signs in last 24 hours: Temp:  [98.2 F (36.8 C)-99.3 F (37.4 C)] 99.3 F (37.4 C) (05/20 0430) Pulse Rate:  [73-97] 73  (05/20 0430) Resp:  [13-23] 18  (05/20 0430) BP: (131-181)/(63-125) 140/88 mmHg (05/20 0646) SpO2:  [87 %-99 %] 94 % (05/20 0430) Weight:  [190 lb (86.183 kg)] 190 lb (86.183 kg) (05/19 1931)  Wt Readings from Last 3 Encounters:  10/27/11 190 lb (86.183 kg)     Current Facility-Administered Medications  Medication Dose Route Frequency Provider Last Rate Last Dose  . 0.9 %  sodium chloride infusion   Intravenous Continuous Durwin Reges, MD 50 mL/hr at 10/28/11 0054    . acetaminophen (TYLENOL) tablet 650 mg  650 mg Oral Q6H PRN Durwin Reges, MD       Or  . acetaminophen (TYLENOL) suppository 650 mg  650 mg Rectal Q6H PRN Durwin Reges, MD      . azithromycin (ZITHROMAX) 500 mg in dextrose 5 % 250 mL IVPB  500 mg Intravenous Once [employer], PA-C   500 mg at 10/27/11 2105  . azithromycin (ZITHROMAX) 500 mg in dextrose 5 % 250 mL IVPB  500 mg Intravenous QHS Durwin Reges, MD      . cefTRIAXone (ROCEPHIN) 1 g in dextrose 5 % 50 mL IVPB  1 g Intravenous Once [employer], PA-C   1 g at 10/27/11 2218  . darifenacin (ENABLEX) 24 hr tablet 7.5 mg  7.5 mg Oral Daily Durwin Reges, MD      . dutasteride (AVODART) capsule 0.5 mg  0.5 mg Oral Daily Durwin Reges, MD      . HYDROcodone-acetaminophen [facility]) 5-325 MG per tablet 1-2 tablet  1-2 tablet Oral Q4H PRN Durwin Reges, MD   2 tablet at 10/28/11 0802  . morphine 2 MG/ML injection 1-2 mg  1-2 mg Intravenous Q3H PRN Durwin Reges, MD   2 mg at  10/28/11 0536  . morphine 4 MG/ML injection 4 mg  4 mg Intravenous Once [employer], PA-C   4 mg at 10/27/11 1942  . ondansetron (ZOFRAN) tablet 4 mg  4 mg Oral Q6H PRN Durwin Reges, MD       Or  . ondansetron (ZOFRAN) injection 4 mg  4 mg Intravenous Q6H PRN Durwin Reges, MD      . polyethylene glycol (MIRALAX / GLYCOLAX) packet 17 g  17 g Oral Daily PRN Durwin Reges, MD      . traMADol Janean Sark) tablet 50 mg  50 mg Oral Q8H PRN Durwin Reges, MD      . DISCONTD: dipyridamole-aspirin (AGGRENOX) 25-200 MG per 12 hr capsule 1 capsule  1 capsule Oral BID Durwin Reges, MD         PE: FAO:ZHYQ no distress CV: RRR no m/r/g MVH:QION Abd: Mild tenderness to palpation in LQ near hips Ext/Musc: 2+ pulses throughout, no edema Neuro: Deficits as noted in H&P, no change  Labs/Studies:  CBC:    Component Value Date/Time   WBC 9.7 10/28/2011 0815   HGB 12.0* 10/28/2011 0815  HCT 37.3* 10/28/2011 0815   PLT 205 10/28/2011 0815   MCV 89.7 10/28/2011 0815   NEUTROABS 6.2 10/27/2011 1740   LYMPHSABS 0.9 10/27/2011 1740   MONOABS 0.9 10/27/2011 1740   EOSABS 0.2 10/27/2011 1740   BASOSABS 0.0 10/27/2011 1740        Assessment/Plan: [name] [name] is a 77 y.o. year old male presenting with R Femur/hip fracture secondary to mechanical loss of balance, w/ h/o CVA and multiple other fractures.   1. R Femur/hip Fracture: Ortho consulted, and planning on surgery today after clearance by vascular due to h/o AAA (per pt wife). H/o mutliple falls and fractures concernning for osteo.  - OR today - PT/OT  - +/- Home eval due to mutliple prior falls and fractures  - Moprphine 1-2mg  Q3PRN for pain  - NPO until surgery  - Vit D level  2. FEN/GI: Regular diet until NPO  - monitor for diarrhea  - NS 22ml/hr   3. ID/Res: AFVSS. Questionable pneumonia on CXR, but unlikely as respiratory status at baseline. H/o emesis concerning for aspiration making chemical irritation more likely. No O2  requirement - DC Azithro, Rocephin  - O2 as needed.  - Incentive spirometer  4. Prophylaxis: SCD  - Hold Aggrenox until after surgery   5. Disposition: Pending surgery and improvement   Signed: Shelly Flatten, MD Family Medicine Resident PGY-1 [phone] 10/28/2011 9:26 AM

## 2011-10-29 ENCOUNTER — Encounter (HOSPITAL_COMMUNITY): Payer: Self-pay | Admitting: Anesthesiology

## 2011-10-29 ENCOUNTER — Encounter (HOSPITAL_COMMUNITY)
Admission: EM | Disposition: A | Discharge status: Skilled Nursing Facility | Payer: Self-pay | Source: Home / Self Care | Attending: Orthopedic Surgery

## 2011-10-29 ENCOUNTER — Inpatient Hospital Stay (HOSPITAL_COMMUNITY): Payer: Medicare Other

## 2011-10-29 ENCOUNTER — Inpatient Hospital Stay (HOSPITAL_COMMUNITY): Payer: Medicare Other | Admitting: Anesthesiology

## 2011-10-29 DIAGNOSIS — S72009A Fracture of unspecified part of neck of unspecified femur, initial encounter for closed fracture: Secondary | ICD-10-CM

## 2011-10-29 DIAGNOSIS — Z96649 Presence of unspecified artificial hip joint: Secondary | ICD-10-CM

## 2011-10-29 HISTORY — PX: HIP ARTHROPLASTY: SHX981

## 2011-10-29 LAB — VITAMIN D 25 HYDROXY (VIT D DEFICIENCY, FRACTURES): Vit D, 25-Hydroxy: 60 ng/mL (ref 30–89)

## 2011-10-29 LAB — CBC
HCT: 39.9 % (ref 39.0–52.0)
Hemoglobin: 12.9 g/dL — ABNORMAL LOW (ref 13.0–17.0)
MCHC: 32.3 g/dL (ref 30.0–36.0)
MCV: 89.3 fL (ref 78.0–100.0)

## 2011-10-29 LAB — DIFFERENTIAL
Basophils Relative: 1 % (ref 0–1)
Eosinophils Relative: 4 % (ref 0–5)
Monocytes Absolute: 1.3 10*3/uL — ABNORMAL HIGH (ref 0.1–1.0)
Monocytes Relative: 13 % — ABNORMAL HIGH (ref 3–12)
Neutro Abs: 7.3 10*3/uL (ref 1.7–7.7)

## 2011-10-29 LAB — URINALYSIS, ROUTINE W REFLEX MICROSCOPIC
Bilirubin Urine: NEGATIVE
Hgb urine dipstick: NEGATIVE
Ketones, ur: 15 mg/dL — AB
Specific Gravity, Urine: 1.014 (ref 1.005–1.030)
Urobilinogen, UA: 1 mg/dL (ref 0.0–1.0)
pH: 7 (ref 5.0–8.0)

## 2011-10-29 SURGERY — HEMIARTHROPLASTY, HIP, DIRECT ANTERIOR APPROACH, FOR FRACTURE
Anesthesia: General | Site: Hip | Laterality: Right | Wound class: Clean

## 2011-10-29 MED ORDER — HYDROMORPHONE HCL PF 1 MG/ML IJ SOLN
INTRAMUSCULAR | Status: AC
  Filled 2011-10-29: qty 1

## 2011-10-29 MED ORDER — POLYETHYLENE GLYCOL 3350 17 G PO PACK
17.0000 g | PACK | Freq: Two times a day (BID) | ORAL | Status: DC
  Administered 2011-10-29 – 2011-10-31 (×4): 17 g via ORAL

## 2011-10-29 MED ORDER — METOCLOPRAMIDE HCL 5 MG/ML IJ SOLN: 5.0000 mg | Freq: Three times a day (TID) | INTRAMUSCULAR | Status: DC | PRN

## 2011-10-29 MED ORDER — ACETAMINOPHEN 10 MG/ML IV SOLN
INTRAVENOUS | Status: DC | PRN
  Administered 2011-10-29: 1000 mg via INTRAVENOUS

## 2011-10-29 MED ORDER — ALUM & MAG HYDROXIDE-SIMETH 200-200-20 MG/5ML PO SUSP: 30.0000 mL | ORAL | Status: DC | PRN

## 2011-10-29 MED ORDER — LIDOCAINE HCL (CARDIAC) 20 MG/ML IV SOLN
INTRAVENOUS | Status: DC | PRN
  Administered 2011-10-29: 30 mg via INTRAVENOUS
  Administered 2011-10-29: 50 mg via INTRAVENOUS

## 2011-10-29 MED ORDER — FENTANYL CITRATE 0.05 MG/ML IJ SOLN
INTRAMUSCULAR | Status: DC | PRN
  Administered 2011-10-29 (×4): 50 ug via INTRAVENOUS

## 2011-10-29 MED ORDER — HYDROMORPHONE HCL PF 1 MG/ML IJ SOLN
0.5000 mg | INTRAMUSCULAR | Status: DC | PRN
  Administered 2011-10-30 (×2): 1 mg via INTRAVENOUS
  Administered 2011-10-30: 0.5 mg via INTRAVENOUS
  Administered 2011-10-31: 1 mg via INTRAVENOUS
  Filled 2011-10-29 (×4): qty 1

## 2011-10-29 MED ORDER — PHENOL 1.4 % MT LIQD
1.0000 | OROMUCOSAL | Status: DC | PRN
  Filled 2011-10-29: qty 177

## 2011-10-29 MED ORDER — METOCLOPRAMIDE HCL 10 MG PO TABS: 5.0000 mg | ORAL_TABLET | Freq: Three times a day (TID) | ORAL | Status: DC | PRN

## 2011-10-29 MED ORDER — HYDROCODONE-ACETAMINOPHEN 5-325 MG PO TABS
1.0000 | ORAL_TABLET | ORAL | Status: DC | PRN
  Administered 2011-10-30 – 2011-10-31 (×6): 2 via ORAL
  Administered 2011-10-31: 1 via ORAL
  Administered 2011-10-31 (×2): 2 via ORAL
  Filled 2011-10-29 (×9): qty 2

## 2011-10-29 MED ORDER — FERROUS SULFATE 325 (65 FE) MG PO TABS
325.0000 mg | ORAL_TABLET | Freq: Three times a day (TID) | ORAL | Status: DC
  Administered 2011-10-30 – 2011-10-31 (×4): 325 mg via ORAL
  Filled 2011-10-29 (×7): qty 1

## 2011-10-29 MED ORDER — PROMETHAZINE HCL 25 MG/ML IJ SOLN: 6.2500 mg | INTRAMUSCULAR | Status: DC | PRN

## 2011-10-29 MED ORDER — SODIUM CHLORIDE 0.9 % IV SOLN
100.0000 mL/h | INTRAVENOUS | Status: DC
  Administered 2011-10-29 – 2011-10-30 (×3): 100 mL/h via INTRAVENOUS
  Administered 2011-10-31: 20 mL/h via INTRAVENOUS
  Filled 2011-10-29 (×9): qty 1000

## 2011-10-29 MED ORDER — NEOSTIGMINE METHYLSULFATE 1 MG/ML IJ SOLN
INTRAMUSCULAR | Status: DC | PRN
  Administered 2011-10-29: 3 mg via INTRAVENOUS

## 2011-10-29 MED ORDER — ROCURONIUM BROMIDE 100 MG/10ML IV SOLN
INTRAVENOUS | Status: DC | PRN
  Administered 2011-10-29: 30 mg via INTRAVENOUS

## 2011-10-29 MED ORDER — ASPIRIN-DIPYRIDAMOLE ER 25-200 MG PO CP12
1.0000 | ORAL_CAPSULE | Freq: Two times a day (BID) | ORAL | Status: DC
  Administered 2011-10-29 – 2011-10-31 (×4): 1 via ORAL
  Filled 2011-10-29 (×5): qty 1

## 2011-10-29 MED ORDER — HYDROMORPHONE HCL PF 1 MG/ML IJ SOLN
0.2500 mg | INTRAMUSCULAR | Status: DC | PRN
  Administered 2011-10-29 (×2): 0.25 mg via INTRAVENOUS

## 2011-10-29 MED ORDER — LACTATED RINGERS IV SOLN
INTRAVENOUS | Status: DC
  Administered 2011-10-29: 1000 mL via INTRAVENOUS

## 2011-10-29 MED ORDER — ZOLPIDEM TARTRATE 5 MG PO TABS: 5.0000 mg | ORAL_TABLET | Freq: Every evening | ORAL | Status: DC | PRN

## 2011-10-29 MED ORDER — DIPHENHYDRAMINE HCL 25 MG PO CAPS: 25.0000 mg | ORAL_CAPSULE | Freq: Four times a day (QID) | ORAL | Status: DC | PRN

## 2011-10-29 MED ORDER — ONDANSETRON HCL 4 MG PO TABS: 4.0000 mg | ORAL_TABLET | Freq: Four times a day (QID) | ORAL | Status: DC | PRN

## 2011-10-29 MED ORDER — FLEET ENEMA 7-19 GM/118ML RE ENEM: 1.0000 | ENEMA | Freq: Once | RECTAL | Status: AC | PRN

## 2011-10-29 MED ORDER — CEFAZOLIN SODIUM 1-5 GM-% IV SOLN
INTRAVENOUS | Status: DC | PRN
  Administered 2011-10-29: 2 g via INTRAVENOUS

## 2011-10-29 MED ORDER — MENTHOL 3 MG MT LOZG
1.0000 | LOZENGE | OROMUCOSAL | Status: DC | PRN
  Filled 2011-10-29: qty 9

## 2011-10-29 MED ORDER — FENTANYL CITRATE 0.05 MG/ML IJ SOLN
INTRAMUSCULAR | Status: AC
  Filled 2011-10-29: qty 2

## 2011-10-29 MED ORDER — PHENYLEPHRINE HCL 10 MG/ML IJ SOLN
10.0000 mg | INTRAVENOUS | Status: DC | PRN
  Administered 2011-10-29: 10 ug/min via INTRAVENOUS

## 2011-10-29 MED ORDER — METHOCARBAMOL 100 MG/ML IJ SOLN
500.0000 mg | Freq: Four times a day (QID) | INTRAVENOUS | Status: DC | PRN
  Administered 2011-10-30: 500 mg via INTRAVENOUS
  Filled 2011-10-29 (×2): qty 5

## 2011-10-29 MED ORDER — CEFAZOLIN SODIUM 1-5 GM-% IV SOLN
1.0000 g | Freq: Four times a day (QID) | INTRAVENOUS | Status: AC
  Administered 2011-10-29 – 2011-10-30 (×3): 1 g via INTRAVENOUS
  Filled 2011-10-29 (×3): qty 50

## 2011-10-29 MED ORDER — DOCUSATE SODIUM 100 MG PO CAPS
100.0000 mg | ORAL_CAPSULE | Freq: Two times a day (BID) | ORAL | Status: DC
  Administered 2011-10-29 – 2011-10-31 (×4): 100 mg via ORAL

## 2011-10-29 MED ORDER — METHOCARBAMOL 500 MG PO TABS
500.0000 mg | ORAL_TABLET | Freq: Four times a day (QID) | ORAL | Status: DC | PRN
  Administered 2011-10-30 – 2011-10-31 (×4): 500 mg via ORAL
  Filled 2011-10-29 (×4): qty 1

## 2011-10-29 MED ORDER — LACTATED RINGERS IV SOLN
INTRAVENOUS | Status: DC | PRN
  Administered 2011-10-29: 18:00:00 via INTRAVENOUS

## 2011-10-29 MED ORDER — GLYCOPYRROLATE 0.2 MG/ML IJ SOLN
INTRAMUSCULAR | Status: DC | PRN
  Administered 2011-10-29: 0.4 mg via INTRAVENOUS

## 2011-10-29 MED ORDER — BISACODYL 5 MG PO TBEC
5.0000 mg | DELAYED_RELEASE_TABLET | Freq: Every day | ORAL | Status: DC | PRN
  Filled 2011-10-29: qty 1

## 2011-10-29 MED ORDER — SUCCINYLCHOLINE CHLORIDE 20 MG/ML IJ SOLN
INTRAMUSCULAR | Status: DC | PRN
  Administered 2011-10-29: 100 mg via INTRAVENOUS

## 2011-10-29 MED ORDER — ONDANSETRON HCL 4 MG/2ML IJ SOLN
4.0000 mg | Freq: Four times a day (QID) | INTRAMUSCULAR | Status: DC | PRN
  Administered 2011-10-30: 4 mg via INTRAVENOUS
  Filled 2011-10-29: qty 2

## 2011-10-29 MED ORDER — PROPOFOL 10 MG/ML IV EMUL
INTRAVENOUS | Status: DC | PRN
  Administered 2011-10-29: 40 mg via INTRAVENOUS
  Administered 2011-10-29: 80 mg via INTRAVENOUS

## 2011-10-29 MED ORDER — CEFAZOLIN SODIUM-DEXTROSE 2-3 GM-% IV SOLR
INTRAVENOUS | Status: AC
  Filled 2011-10-29: qty 50

## 2011-10-29 SURGICAL SUPPLY — 51 items
ADH SKN CLS APL DERMABOND .7 (GAUZE/BANDAGES/DRESSINGS) ×1
BAG SPEC THK2 15X12 ZIP CLS (MISCELLANEOUS) ×1
BAG ZIPLOCK 12X15 (MISCELLANEOUS) ×2 IMPLANT
BLADE SAW SGTL 18X1.27X75 (BLADE) ×2 IMPLANT
CLOTH BEACON ORANGE TIMEOUT ST (SAFETY) ×2 IMPLANT
CLSR STERI-STRIP ANTIMIC 1/2X4 (GAUZE/BANDAGES/DRESSINGS) ×1 IMPLANT
DERMABOND ADVANCED (GAUZE/BANDAGES/DRESSINGS) ×1
DERMABOND ADVANCED .7 DNX12 (GAUZE/BANDAGES/DRESSINGS) IMPLANT
DRAPE INCISE IOBAN 85X60 (DRAPES) ×2 IMPLANT
DRAPE ORTHO SPLIT 77X108 STRL (DRAPES) ×4
DRAPE POUCH INSTRU U-SHP 10X18 (DRAPES) ×2 IMPLANT
DRAPE SURG 17X11 SM STRL (DRAPES) ×2 IMPLANT
DRAPE SURG ORHT 6 SPLT 77X108 (DRAPES) ×2 IMPLANT
DRAPE U-SHAPE 47X51 STRL (DRAPES) ×2 IMPLANT
DRSG AQUACEL AG ADV 3.5X10 (GAUZE/BANDAGES/DRESSINGS) ×1 IMPLANT
DRSG MEPILEX BORDER 4X4 (GAUZE/BANDAGES/DRESSINGS) ×1 IMPLANT
DRSG MEPILEX BORDER 4X8 (GAUZE/BANDAGES/DRESSINGS) ×1 IMPLANT
DRSG TEGADERM 4X4.75 (GAUZE/BANDAGES/DRESSINGS) ×1 IMPLANT
DURAPREP 26ML APPLICATOR (WOUND CARE) ×2 IMPLANT
ELECT BLADE TIP CTD 4 INCH (ELECTRODE) ×2 IMPLANT
ELECT REM PT RETURN 9FT ADLT (ELECTROSURGICAL) ×2
ELECTRODE REM PT RTRN 9FT ADLT (ELECTROSURGICAL) ×1 IMPLANT
EVACUATOR 1/8 PVC DRAIN (DRAIN) ×2 IMPLANT
FACESHIELD LNG OPTICON STERILE (SAFETY) ×7 IMPLANT
GAUZE SPONGE 2X2 8PLY STRL LF (GAUZE/BANDAGES/DRESSINGS) IMPLANT
GLOVE BIOGEL PI IND STRL 7.5 (GLOVE) ×1 IMPLANT
GLOVE BIOGEL PI IND STRL 8 (GLOVE) ×1 IMPLANT
GLOVE BIOGEL PI INDICATOR 7.5 (GLOVE) ×1
GLOVE BIOGEL PI INDICATOR 8 (GLOVE) ×1
GLOVE ECLIPSE 8.0 STRL XLNG CF (GLOVE) IMPLANT
GLOVE ORTHO TXT STRL SZ7.5 (GLOVE) ×4 IMPLANT
GLOVE SURG ORTHO 8.0 STRL STRW (GLOVE) ×2 IMPLANT
GOWN BRE IMP PREV XXLGXLNG (GOWN DISPOSABLE) ×4 IMPLANT
GOWN STRL NON-REIN LRG LVL3 (GOWN DISPOSABLE) ×2 IMPLANT
HANDPIECE INTERPULSE COAX TIP (DISPOSABLE)
IMMOBILIZER KNEE 20 (SOFTGOODS)
IMMOBILIZER KNEE 20 THIGH 36 (SOFTGOODS) IMPLANT
KIT BASIN OR (CUSTOM PROCEDURE TRAY) ×2 IMPLANT
MANIFOLD NEPTUNE II (INSTRUMENTS) ×2 IMPLANT
PACK TOTAL JOINT (CUSTOM PROCEDURE TRAY) ×2 IMPLANT
POSITIONER SURGICAL ARM (MISCELLANEOUS) ×2 IMPLANT
SET HNDPC FAN SPRY TIP SCT (DISPOSABLE) IMPLANT
SPONGE GAUZE 2X2 STER 10/PKG (GAUZE/BANDAGES/DRESSINGS) ×1
STRIP CLOSURE SKIN 1/2X4 (GAUZE/BANDAGES/DRESSINGS) ×2 IMPLANT
SUT ETHIBOND NAB CT1 #1 30IN (SUTURE) ×2 IMPLANT
SUT MNCRL AB 4-0 PS2 18 (SUTURE) ×2 IMPLANT
SUT VIC AB 1 CT1 36 (SUTURE) ×4 IMPLANT
SUT VIC AB 2-0 CT1 27 (SUTURE) ×4
SUT VIC AB 2-0 CT1 TAPERPNT 27 (SUTURE) ×2 IMPLANT
TOWEL OR 17X26 10 PK STRL BLUE (TOWEL DISPOSABLE) ×4 IMPLANT
TRAY FOLEY CATH 14FRSI W/METER (CATHETERS) ×1 IMPLANT

## 2011-10-29 NOTE — Transfer of Care (Signed)
Immediate Anesthesia Transfer of Care Note  Patient: [name] [name]  Procedure(s) Performed: Procedure(s) (LRB): ARTHROPLASTY BIPOLAR HIP (Right)  Patient Location: PACU  Anesthesia Type: General  Level of Consciousness: sedated, patient cooperative and responds to stimulation  Airway & Oxygen Therapy: Patient Spontanous Breathing and Patient connected to face mask oxygen  Post-op Assessment: Report given to PACU RN, Post -op Vital signs reviewed and stable and Patient moving all extremities X 4  Post vital signs: Reviewed and stable  Complications: No apparent anesthesia complications

## 2011-10-29 NOTE — Anesthesia Preprocedure Evaluation (Addendum)
Anesthesia Evaluation  Patient identified by MRN, date of birth, ID band Patient awake    Reviewed: Allergy & Precautions, H&P , NPO status , Patient's Chart, lab work & pertinent test results  Airway Mallampati: II TM Distance: >3 FB Neck ROM: Full    Dental No notable dental hx.    Pulmonary  CXR reviewed: interstitial edema pattern breath sounds clear to auscultation  Pulmonary exam normal       Cardiovascular hypertension, + angina + CAD Rhythm:Regular Rate:Normal  ECHO 2006 OK ECG: incomplete RBBB   Neuro/Psych  Headaches, Aphasia CVA, Residual Symptoms negative psych ROS   GI/Hepatic negative GI ROS, Neg liver ROS,   Endo/Other  negative endocrine ROS  Renal/GU negative Renal ROS  negative genitourinary   Musculoskeletal negative musculoskeletal ROS (+)   Abdominal   Peds negative pediatric ROS (+)  Hematology negative hematology ROS (+)   Anesthesia Other Findings   Reproductive/Obstetrics negative OB ROS                           Anesthesia Physical Anesthesia Plan  ASA: III  Anesthesia Plan: General   Post-op Pain Management:    Induction: Intravenous  Airway Management Planned: Oral ETT  Additional Equipment:   Intra-op Plan:   Post-operative Plan: Extubation in OR  Informed Consent: I have reviewed the patients History and Physical, chart, labs and discussed the procedure including the risks, benefits and alternatives for the proposed anesthesia with the patient or authorized representative who has indicated his/her understanding and acceptance.   Dental advisory given  Plan Discussed with: CRNA  Anesthesia Plan Comments: (Discussed r/b general versus spinal with patient and wife. Plan general.)       Anesthesia Quick Evaluation

## 2011-10-29 NOTE — Interval H&P Note (Signed)
History and Physical Interval Note:  10/29/2011 4:55 PM  [name] [name]  has presented today for surgery, with the diagnosis of fractured right hip  The various methods of treatment have been discussed with the patient and family. After consideration of risks, benefits and other options for treatment, the patient has consented to  Procedure(s) (LRB): Right hip hemiarthroplasty ARTHROPLASTY BIPOLAR HIP (Right) as a surgical intervention .  The patients' history has been reviewed, patient examined, no change in status, stable for surgery.  I have reviewed the patients' chart and labs.  Questions were answered to the patient's satisfaction.     Shelda Pal

## 2011-10-29 NOTE — Anesthesia Postprocedure Evaluation (Signed)
  Anesthesia Post-op Note  Patient: [name] [name]  Procedure(s) Performed: Procedure(s) (LRB): ARTHROPLASTY BIPOLAR HIP (Right)  Patient Location: PACU  Anesthesia Type: General  Level of Consciousness: awake and alert   Airway and Oxygen Therapy: Patient Spontanous Breathing  Post-op Pain: mild  Post-op Assessment: Post-op Vital signs reviewed, Patient's Cardiovascular Status Stable, Respiratory Function Stable, Patent Airway and No signs of Nausea or vomiting  Post-op Vital Signs: stable  Complications: No apparent anesthesia complications

## 2011-10-29 NOTE — Progress Notes (Signed)
Family Medicine Teaching Service [facility] Progress Note  Patient name: [name] [name] Medical record number: [id number] Date of birth: July 17, 1934 Age: 77 y.o. Gender: male    LOS: 2 days   Primary Care Provider: Evie Lacks, MD, MD  Overnight Events:  NAEO. Pain well controlled. Prefer Norco over morphine. NPO after midnight. Dr. Charlann Boxer planning surgery at 17:00 at [facility]. Difficulty understanding w/ incentive spirometer   Objective: Vital signs in last 24 hours: Temp:  [97.4 F (36.3 C)-100.9 F (38.3 C)] 100.9 F (38.3 C) (05/21 0535) Pulse Rate:  [71-86] 82  (05/21 0535) Resp:  [16-18] 16  (05/21 0535) BP: (154-182)/(64-70) 182/68 mmHg (05/21 0535) SpO2:  [90 %-96 %] 96 % (05/21 0535)  Wt Readings from Last 3 Encounters:  10/27/11 190 lb (86.183 kg)  10/27/11 190 lb (86.183 kg)     Current Facility-Administered Medications  Medication Dose Route Frequency Provider Last Rate Last Dose  . 0.9 %  sodium chloride infusion   Intravenous Continuous Durwin Reges, MD 50 mL/hr at 10/28/11 1900    . acetaminophen (TYLENOL) tablet 650 mg  650 mg Oral Q6H PRN Durwin Reges, MD       Or  . acetaminophen (TYLENOL) suppository 650 mg  650 mg Rectal Q6H PRN Durwin Reges, MD      . cyclobenzaprine (FLEXERIL) tablet 5 mg  5 mg Oral QHS Lonia Skinner, MD   5 mg at 10/28/11 2111  . darifenacin (ENABLEX) 24 hr tablet 7.5 mg  7.5 mg Oral Daily Durwin Reges, MD   7.5 mg at 10/28/11 1045  . dutasteride (AVODART) capsule 0.5 mg  0.5 mg Oral Daily Durwin Reges, MD   0.5 mg at 10/28/11 1045  . HYDROcodone-acetaminophen (NORCO) 5-325 MG per tablet 1-2 tablet  1-2 tablet Oral Q4H PRN Durwin Reges, MD   2 tablet at 10/29/11 0650  . morphine 2 MG/ML injection 1 mg  1 mg Intravenous Q2H PRN Barnabas Lister, MD   1 mg at 10/28/11 1659  . ondansetron (ZOFRAN) tablet 4 mg  4 mg Oral Q6H PRN Durwin Reges, MD       Or  . ondansetron (ZOFRAN) injection 4 mg  4 mg Intravenous Q6H PRN Durwin Reges, MD        . polyethylene glycol (MIRALAX / GLYCOLAX) packet 17 g  17 g Oral Daily PRN Durwin Reges, MD      . traMADol Janean Sark) tablet 50 mg  50 mg Oral Q8H PRN Durwin Reges, MD      . DISCONTD: azithromycin (ZITHROMAX) 500 mg in dextrose 5 % 250 mL IVPB  500 mg Intravenous QHS Durwin Reges, MD      . DISCONTD: morphine 2 MG/ML injection 1-2 mg  1-2 mg Intravenous Q3H PRN Durwin Reges, MD   2 mg at 10/28/11 0536     PE: UXN:ATFT no distress  CV: RRR no m/r/g  DDU:KGUR, normal effort Abd: Non-painful to light palpation  Ext/Musc: 2+ pulses throughout, no edema, Tenderness over R hip Neuro: expressive aphasia, minimal contracture of RUE    Labs/Studies:  Vitamin D: 60   Assessment/Plan:  [name] [name] is a 77 y.o. year old male presenting with R Femur/hip fracture secondary to mechanical loss of balance, w/ h/o CVA and multiple other fractures. Awaiting surgery   1. R Femur/hip Fracture: Dr. Charlann Boxer of ortho has pt scheduled for surgery today at 17:00 at [facility]. Fever to  100.9 overnight (see ID below). Per wife pt w/ AAA of 4.8cm last measured 7/12. No imaging results in Epic. Discused case w/ Dr. Charlann Boxer who feels pt still surgical candidate. Agree w/ plan for surgery. Will obtain CBC and UA. H/o mutliple falls and fractures concernning for osteo. Vitamin D level normal, pt recently started on Calcium Vit D by PCP per wife.  Pt to stay at [facility] post op - OR today  - PT/OT  - Moprphine 1-2mg  Q3PRN and norco for pain  - Flexeril - NPO until surgery   2. FEN/GI: Regular diet until NPO  - monitor for diarrhea  - NS 33ml/hr   3. ID/Res: One temp to 100.9 early this morning. Most recent temp is 98.6 though pt w/ tylenol after previous read. Atelectasis vs false temp vs acute reaction to condition vs infection. Questionable pneumonia on CXR, but unlikely as respiratory status at baseline. H/o emesis concerning for aspiration making chemical irritation more likely. No O2 requirement, lungs CTAB -  repeat CBC w/ diff - UA - O2 as needed.  - Incentive spirometer   4. Prophylaxis: SCD  - Hold Aggrenox until after surgery   5. Disposition: Pending surgery and improvement       Signed: Shelly Flatten, MD Family Medicine Resident PGY-1 [phone] 10/29/2011 8:43 AM

## 2011-10-29 NOTE — ED Provider Notes (Signed)
Medical screening examination/treatment/procedure(s) were conducted as a shared visit with non-physician practitioner(s) and myself.  I personally evaluated the patient during the encounter  One episode of diarrhea followed by mechanical fall and R hip pain.  No LOC. On aggrenox for previous CVA. RLE shortened and externally rotated. NVI distally.  Glynn Octave, MD 10/29/11 [phone]

## 2011-10-29 NOTE — Anesthesia Procedure Notes (Signed)
Procedure Name: Intubation Date/Time: 10/29/2011 6:13 PM Performed by: Randon Goldsmith CATHERINE PAYNE Pre-anesthesia Checklist: Patient identified, Emergency Drugs available, Suction available and Patient being monitored Patient Re-evaluated:Patient Re-evaluated prior to inductionOxygen Delivery Method: Circle system utilized Preoxygenation: Pre-oxygenation with 100% oxygen Intubation Type: IV induction Ventilation: Mask ventilation without difficulty and Oral airway inserted - appropriate to patient size Laryngoscope Size: Miller and 3 Grade View: Grade I Tube type: Oral Tube size: 7.5 mm Number of attempts: 1 Airway Equipment and Method: Stylet Placement Confirmation: ETT inserted through vocal cords under direct vision,  positive ETCO2,  CO2 detector and breath sounds checked- equal and bilateral Secured at: 22 cm Tube secured with: Tape Dental Injury: Teeth and Oropharynx as per pre-operative assessment

## 2011-10-29 NOTE — Preoperative (Signed)
Beta Blockers   Reason not to administer Beta Blockers:Not Applicable 

## 2011-10-29 NOTE — Progress Notes (Signed)
Pt being transferred to [facility] for Dr Charlann Boxer to perform surgery. Pt being transported by [facility] at this time. Pts wife at bedside.

## 2011-10-29 NOTE — Progress Notes (Signed)
Family Medicine Teaching Service Attending Note  I interviewed and examined patient [name] [name] and reviewed their tests and x-rays.  I discussed with Dr. Clinton Sawyer and reviewed their note for today.  I agree with their assessment and plan.     Additionally  Transfer today Recommend start bowel regimen as soon as can take orals
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
## 2011-10-29 NOTE — Progress Notes (Signed)
PT Cancellation Note  Treatment cancelled today due to pt being transfered to [facility] for surgery to correct R femur fracture. Please consult PT at [facility] when appropriate.Marcene Brawn 10/29/2011, 9:57 AM  Lewis Shock, PT, DPT Pager #: [phone] Office #: [phone]
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
## 2011-10-30 LAB — BASIC METABOLIC PANEL
Chloride: 103 mEq/L (ref 96–112)
Creatinine, Ser: 0.73 mg/dL (ref 0.50–1.35)
GFR calc Af Amer: >90 mL/min (ref >90)
Potassium: 3.6 mEq/L (ref 3.5–5.1)

## 2011-10-30 LAB — VITAMIN D 1,25 DIHYDROXY
Vitamin D 1, 25 (OH)2 Total: 44 pg/mL (ref 18–72)
Vitamin D3 1, 25 (OH)2: 44 pg/mL

## 2011-10-30 LAB — GLUCOSE, CAPILLARY: Glucose-Capillary: 108 mg/dL — ABNORMAL HIGH (ref 70–99)

## 2011-10-30 LAB — CBC
Platelets: 269 10*3/uL (ref 150–400)
RDW: 13.7 % (ref 11.5–15.5)
WBC: 10.9 10*3/uL — ABNORMAL HIGH (ref 4.0–10.5)

## 2011-10-30 NOTE — Evaluation (Signed)
Physical Therapy Evaluation Patient Details Name: [name] [name] MRN: [id number] DOB: 1934-10-14 Today's Date: 10/30/2011 Time: 7829-5621 PT Time Calculation (min): 32 min  PT Assessment / Plan / Recommendation Clinical Impression  Pt is s/p  R hip hemiarthroplasty after fall. H/O R sided hemiplegia but was ambulatory w/ devices. Pt will benefit from PT to improve in functionalobility, ROM, strength to DC to SNF    PT Assessment  Patient needs continued PT services    Follow Up Recommendations  Skilled nursing facility    Barriers to Discharge        lEquipment Recommendations  Defer to next venue    Recommendations for Other Services OT consult   Frequency Min 3X/week    Precautions / Restrictions Precautions Precautions: Posterior Hip Precaution Booklet Issued: Yes (comment) Precaution Comments: Posterior precautions posted. Restrictions Weight Bearing Restrictions: Yes RLE Weight Bearing: Weight bearing as tolerated   Pertinent Vitals/Pain 10/10 had premedication      Mobility  Bed Mobility Bed Mobility: Supine to Sit;Sit to Supine Supine to Sit: 1: +2 Total assist;With rails;HOB elevated Supine to Sit: Patient Percentage: 10% Sit to Supine: 1: +2 Total assist;HOB flat Sit to Supine: Patient Percentage: 10% Details for Bed Mobility Assistance: RLE maintained w/ pillow between knees to prevent adduction. lifted legs together to get onto bed.    Exercises     PT Diagnosis: Hemiplegia dominant side  PT Problem List: Decreased strength;Decreased range of motion;Decreased activity tolerance;Decreased balance;Decreased mobility;Impaired tone;Pain PT Treatment Interventions: Functional mobility training;Therapeutic activities;Patient/family education;Therapeutic exercise   PT Goals Acute Rehab PT Goals PT Goal Formulation: With patient/family Time For Goal Achievement: 11/13/11 Potential to Achieve Goals: Good Pt will go Supine/Side to Sit: with mod assist;with  HOB not 0 degrees (comment degree);with rail PT Goal: Supine/Side to Sit - Progress: Goal set today Pt will go Sit to Supine/Side: with mod assist;with HOB not 0 degrees (comment degree) PT Goal: Sit to Supine/Side - Progress: Goal set today Pt will Transfer Bed to Chair/Chair to Bed: with +2 total assist (Pt = 50 %) PT Transfer Goal: Bed to Chair/Chair to Bed - Progress: Goal set today Additional Goals Additional Goal #1: Wife will position RLE  for posterior hip precautions. PT Goal: Additional Goal #1 - Progress: Goal set today  Visit Information  Last PT Received On: 10/30/11 Assistance Needed: +2    Subjective Data  Subjective: Pt w/ expressive aphasia bu does indicate pain.  Patient Stated Goal: per wife going to [place]   Prior Functioning  Home Living Lives With: Spouse Available Help at Discharge: Skilled Nursing Facility Type of Home: House Home Access: Stairs to enter Home Layout: One level Bathroom Shower/Tub: Naval architect Equipment: Wheelchair - powered;Quad cane;Grab bars in shower;Built-in shower seat;Bedside commode/3-in-1;Other (comment) (long leg brace) Additional Comments: per wife pt could ambulate w. Rkafo w/ quad cane in home. Prior Function Level of Independence: Needs assistance Needs Assistance: Bathing;Dressing;Meal Prep;Light Housekeeping;Gait;Transfers Bath: Supervision/set-up Dressing: Maximal Meal Prep: Total Light Housekeeping: Total Gait Assistance: VWife assists with pt as he uses quad cane. Uses scooter outside of home. (comment: use long leg brace on R to prevent hyperextension of knee Transfer Assistance: Wife assists pt with transfers Able to Take Stairs?: Yes (reports goes up forward and down backward) Driving: No Communication Communication: Expressive difficulties Dominant Hand: Right (Uses L as lead since CVA.)    Cognition  Overall Cognitive Status: Difficult to assess Difficult to assess due to: Other (comment)  (due to expressive aphasia) Arousal/Alertness:  Awake/alert Orientation Level: Person;Place;Situation Behavior During Session: [facility] for tasks performed    Extremity/Trunk Assessment Right Upper Extremity Assessment RUE ROM/Strength/Tone: Deficits (R hemiparesis with increased flexor tone at baseline) RUE ROM/Strength/Tone Deficits: Can use as a functional stabilizer RUE Sensation: Deficits RUE Coordination: Deficits Left Upper Extremity Assessment LUE ROM/Strength/Tone: Within functional levels LUE Coordination: WFL - gross/fine motor Right Lower Extremity Assessment RLE ROM/Strength/Tone: Deficits RLE ROM/Strength/Tone Deficits: pt postures w/ Knee flexed and in some IR. .pt has + flexor tone, pt unable to move out of flexion w/o manual assistanc RLE Sensation: Deficits RLE Sensation Deficits: decreased light touch per wife Left Lower Extremity Assessment LLE ROM/Strength/Tone: WFL for tasks assessed LLE Sensation: WFL - Light Touch   Balance Balance Balance Assessed: Yes Static Sitting Balance Static Sitting - Balance Support: Feet supported;Left upper extremity supported Static Sitting - Level of Assistance: 4: Min assist Static Sitting - Comment/# of Minutes: x 7 minutes, tc to maintain near midline, tends to lean away from R hip   End of Session PT - End of Session Activity Tolerance: Patient limited by pain Patient left: in bed;with call bell/phone within reach;with family/visitor present Nurse Communication: Mobility status   Rada Hay 10/30/2011, 5:11 PM  [phone]

## 2011-10-30 NOTE — Evaluation (Signed)
Occupational Therapy Evaluation Patient Details Name: [name] [name] MRN: [id number] DOB: 1935-06-06 Today's Date: 10/30/2011 Time: 1359-1430 OT Time Calculation (min): 31 min  OT Assessment / Plan / Recommendation Clinical Impression  Pt is a 77 year old man recovering from R hip hemiarthroplasty after falling at home.  Pt has R hemiparesis at baseline due to a CVA.  Pt is limited by pain in R hip and flexor muscle spasms.  Plan is for pt to go to SNF for ST rehab.  Will defer further OT to SNF.    OT Assessment  All further OT needs can be met in the next venue of care    Follow Up Recommendations  Skilled nursing facility    Barriers to Discharge      Equipment Recommendations  Defer to next venue    Recommendations for Other Services    Frequency       Precautions / Restrictions Precautions Precautions: Posterior Hip;Fall Precaution Booklet Issued: Yes (comment) Restrictions Weight Bearing Restrictions: Yes RLE Weight Bearing: Weight bearing as tolerated   Pertinent Vitals/Pain 10/10 in R hip/LE    ADL  Eating/Feeding: Set up;Simulated Where Assessed - Eating/Feeding: Bed level Grooming: Simulated;Minimal assistance Where Assessed - Grooming: Unsupported sitting Upper Body Bathing: Simulated;Maximal assistance Where Assessed - Upper Body Bathing: Unsupported sitting Lower Body Bathing: Simulated;+1 Total assistance Where Assessed - Lower Body Bathing: Rolling right and/or left;Supine, head of bed up Upper Body Dressing: Simulated;Moderate assistance Where Assessed - Upper Body Dressing: Unsupported sitting Lower Body Dressing: Simulated;+1 Total assistance Where Assessed - Lower Body Dressing: Unsupported sitting Transfers/Ambulation Related to ADLs: Session limited to bed mobility and sitting EOB due to pain.    OT Diagnosis: Acute pain;Generalized weakness;Hemiplegia dominant side  OT Problem List: Decreased activity tolerance;Impaired balance (sitting and/or  standing);Decreased knowledge of precautions;Pain;Impaired UE functional use;Impaired vision/perception;Impaired tone;Impaired sensation;Decreased strength      Visit Information  Last OT Received On: 10/30/11 Assistance Needed: +2 PT/OT Co-Evaluation/Treatment: Yes    Subjective Data  Subjective: Pt's wife reports pt having multiple falls and fractures. Patient Stated Goal: SNF for rehab.   Prior Functioning  Home Living Lives With: Spouse Available Help at Discharge: Skilled Nursing Facility Type of Home: House Home Access: Stairs to enter Home Layout: One level Bathroom Shower/Tub: Naval architect Equipment: Wheelchair - powered;Quad cane;Grab bars in shower;Built-in shower seat;Bedside commode/3-in-1;Other (comment) (long leg brace) Prior Function Level of Independence: Needs assistance Needs Assistance: Bathing;Dressing;Meal Prep;Light Housekeeping;Gait;Transfers Bath: Supervision/set-up Dressing: Maximal Meal Prep: Total Light Housekeeping: Total Gait Assistance: Wife assists with pt as he uses quad cane.  Uses scooter outside of home. (use long leg brace on R to prevent hyperextension of knee) Transfer Assistance: Wife assists pt with transfers. Able to Take Stairs?: Yes Driving: No Communication Communication: Expressive difficulties Dominant Hand: Right (Uses L as lead since CVA.)    Cognition  Overall Cognitive Status: Difficult to assess Difficult to assess due to: Other (comment) (due to expressive aphasia) Arousal/Alertness: Awake/alert Orientation Level: Person;Place;Situation Behavior During Session: [facility] for tasks performed    Extremity/Trunk Assessment Right Upper Extremity Assessment RUE ROM/Strength/Tone: Deficits (R hemiparesis with increased flexor tone at baseline) RUE ROM/Strength/Tone Deficits: Can use as a functional stabilizer RUE Sensation: Deficits RUE Coordination: Deficits Left Upper Extremity Assessment LUE ROM/Strength/Tone:  Within functional levels LUE Coordination: WFL - gross/fine motor   Mobility Bed Mobility Bed Mobility: Supine to Sit;Sit to Supine Supine to Sit: 1: +2 Total assist;HOB elevated Supine to Sit: Patient Percentage: 10% Sit  to Supine: 1: +2 Total assist;HOB flat Sit to Supine: Patient Percentage: 10%       Balance Balance Balance Assessed: Yes Static Sitting Balance Static Sitting - Balance Support: Feet supported;Left upper extremity supported Static Sitting - Level of Assistance: 4: Min assist;Other (comment) (to stand by assist) Static Sitting - Comment/# of Minutes: approximately 7  End of Session OT - End of Session Activity Tolerance: Patient limited by pain Patient left: in bed;with call bell/phone within reach;with family/visitor present   Evern Bio 10/30/2011, 2:48 PM

## 2011-10-30 NOTE — Progress Notes (Signed)
Subjective: 1 Day Post-Op Procedure(s) (LRB): ARTHROPLASTY BIPOLAR HIP (Right)   Patient reports pain as moderate, c/o pain in the right hip. Previous CVA, has limited ROM and strength in the right leg. Other than pain being an issue, no other events throughout the night.  Objective:   VITALS:   Filed Vitals:   10/30/11 0635  BP: 158/78  Pulse: 86  Temp: 97.7 F (36.5 C)  Resp: 18    Incision: dressing C/D/I No cellulitis present Compartment soft  LABS  Basename 10/30/11 0353 10/29/11 0930 10/28/11 0815  HGB 11.7* 12.9* 12.0*  HCT 35.9* 39.9 37.3*  WBC 10.9* 10.1 9.7  PLT 269 225 205     Basename 10/30/11 0353 10/28/11 0815 10/27/11 1740  NA 136 135 134*  K 3.6 3.4* 3.8  BUN 9 10 16   CREATININE 0.73 0.88 0.96  GLUCOSE 134* 101* 121*     Assessment/Plan: 1 Day Post-Op Procedure(s) (LRB): ARTHROPLASTY BIPOLAR HIP (Right)   HV drain d/c'ed Advance diet Up with therapy D/C IV fluids Discharge to SNF tomorrow if he does good throughout the day   Anastasio Auerbach. Jarold Macomber   PAC  10/30/2011, 10:01 AM

## 2011-10-31 ENCOUNTER — Encounter (HOSPITAL_COMMUNITY): Payer: Self-pay | Admitting: Orthopedic Surgery

## 2011-10-31 LAB — CBC
MCH: 28.3 pg (ref 26.0–34.0)
Platelets: 249 10*3/uL (ref 150–400)
RBC: 3.75 MIL/uL — ABNORMAL LOW (ref 4.22–5.81)
RDW: 13.7 % (ref 11.5–15.5)
WBC: 9.8 10*3/uL (ref 4.0–10.5)

## 2011-10-31 LAB — BASIC METABOLIC PANEL
CO2: 25 mEq/L (ref 19–32)
Calcium: 8.1 mg/dL — ABNORMAL LOW (ref 8.4–10.5)
GFR calc Af Amer: >90 mL/min (ref >90)
Sodium: 137 mEq/L (ref 135–145)

## 2011-10-31 MED ORDER — DSS 100 MG PO CAPS: 100.0000 mg | ORAL_CAPSULE | Freq: Two times a day (BID) | ORAL | Status: AC

## 2011-10-31 MED ORDER — METHOCARBAMOL 500 MG PO TABS: 500.0000 mg | ORAL_TABLET | Freq: Four times a day (QID) | ORAL | Status: AC | PRN

## 2011-10-31 MED ORDER — FERROUS SULFATE 325 (65 FE) MG PO TABS: 325.0000 mg | ORAL_TABLET | Freq: Three times a day (TID) | ORAL | Status: DC

## 2011-10-31 MED ORDER — DIPHENHYDRAMINE HCL 25 MG PO CAPS: 25.0000 mg | ORAL_CAPSULE | Freq: Four times a day (QID) | ORAL | Status: DC | PRN

## 2011-10-31 MED ORDER — POLYETHYLENE GLYCOL 3350 17 G PO PACK: 17.0000 g | PACK | Freq: Two times a day (BID) | ORAL | Status: AC

## 2011-10-31 MED ORDER — HYDROCODONE-ACETAMINOPHEN 5-325 MG PO TABS: 1.0000 | ORAL_TABLET | ORAL | Status: AC | PRN

## 2011-10-31 NOTE — Discharge Summary (Signed)
Physician Discharge Summary  Patient ID: [name] [name] MRN: [id number] DOB/AGE: November 10, 1934 77 y.o.  Admit date: 10/27/2011 Discharge date: 10/31/2011  Procedures:  Procedure(s) (LRB): ARTHROPLASTY BIPOLAR HIP (Right)  Attending Physician:  Dr. Durene Romans   Admission Diagnoses: Right femoral neck fracture  Discharge Diagnoses:  Active Problems:  HLD (hyperlipidemia)  History of CVA (cerebrovascular accident)  S/P right hip hemi  Femoral neck fracture HLD (hyperlipidemia) Hx of CVA   HPI: [name] [name] is a 77 y.o. year old male presented with fall and R femur fracture. Pt and wife came home when pt had episode of diarrhea. Was unable to make it to bathroom. Pts wife was attempting to clean up pt and had him hold onto an open closet door with his right hand. The door swung out and he fell on his right side. Pt brought to ED. Pt w/ 3 day h/o N/V prior to event. Dramamine w/ relief of symptoms. Of note pt w/ CVA x2 in 2005 and 2006 resulting in some R sided muscle weakness and expressive aphasia. Pt on Aggrenox since that time. Pt w/ multiple previous falls and fractures including humorous and spine. Placed on calcium supplement last year by PCP. Pt reports one episode of CP 2 wks ago that was relieved by one chewable ASA. Patient was brought the the ED Course where multiple imaging modalities showing R femur neck fracture, and multiple other healing fractures, negative intracranial hemorrhage. Ortho was consulted, and patient seen by Dr. Charlann Boxer.  Patient's wife is well known to the practice. Risks, benefits and expectations were discussed with the patient. Patient understand the risks, benefits and expectations and wishes to proceed with surgery.   PCP: Evie Lacks, MD, MD   Discharged Condition: fair  Hospital Course:  Patient was originally brought to [facility] and transferred to [facility] on 10/29/11. Patient underwent the above stated procedure on  10/29/2011. Patient tolerated the procedure well and brought to the recovery room in good condition and subsequently to the floor.  POD #1 BP: 158/78 ; Pulse: 86 ; Temp: 97.7 F (36.5 C) ; Resp: 18  Pt's foley was removed, as well as the hemovac drain removed. IV was changed to a saline lock. Patient reports pain as moderate, c/o pain in the right hip. Previous CVA, has limited ROM and strength in the right leg. Other than pain being an issue, no other events throughout the night. Incision: dressing C/D/I, no cellulitis present and compartment soft.   LABS  Basename  10/30/11 0353   HGB  11.7  HCT  35.9    POD #2  BP: 162/66 ; Pulse: 78 ; Temp: 98.1 F (36.7 C) ; Resp: 20  Patient reports pain as mild, pain is under better control than it was yesterday. No events throughout the night. Ready to be discharged to SNF. Incision: dressing C/D/I, no cellulitis present and compartment soft.   LABS  Basename  10/31/11 0410   HGB  10.6  HCT  33.3    Discharge Exam: General appearance: cooperative and no distress Extremities: Homans sign is negative, no sign of DVT, no edema, redness or tenderness in the calves or thighs and no ulcers, gangrene or trophic changes  Disposition: Skilled Nursing Facility with follow up in 2 weeks  Follow-up Information    Follow up with OLIN,Ymani Porcher D in 2 weeks.   Contact information:   [facility] [address], [address] [place] [address] [phone]  Discharge Orders    Future Appointments: Provider: Department: Dept Phone: Center:   12/24/2011 9:00 AM [facility] [phone] [facility]   12/24/2011 9:30 AM [facility] [phone] [facility]   12/24/2011 10:00 AM Evern Bio, NP [facility] [phone] [facility]     Future Orders Please Complete By Expires   Call MD / Call 911      Comments:   If you experience chest pain or shortness of breath, CALL 911 and be transported to  the hospital emergency room.  If you develope a fever above 101 F, pus (white drainage) or increased drainage or redness at the wound, or calf pain, call your surgeon's office.   Discharge instructions      Comments:   Maintain surgical dressing for 8 days, then replace with gauze and tape. Keep the area dry and clean until follow up. Follow up in 2 weeks at [facility]. Call with any questions or concerns.     Constipation Prevention      Comments:   Drink plenty of fluids.  Prune juice may be helpful.  You may use a stool softener, such as Colace (over the counter) 100 mg twice a day.  Use MiraLax (over the counter) for constipation as needed.   Increase activity slowly as tolerated      Driving restrictions      Comments:   No driving for 4 weeks   Change dressing      Comments:   Maintain surgical dressing for 8 days, then replace with 4x4 guaze and tape. Keep the area dry and clean.   TED hose      Comments:   Use stockings (TED hose) for 2 weeks on both leg(s).  You may remove them at night for sleeping.      Current Discharge Medication List    START taking these medications   Details  diphenhydrAMINE (BENADRYL) 25 mg capsule Take 1 capsule (25 mg total) by mouth every 6 (six) hours as needed for itching, allergies or sleep.    docusate sodium 100 MG CAPS Take 100 mg by mouth 2 (two) times daily.    ferrous sulfate 325 (65 FE) MG tablet Take 1 tablet (325 mg total) by mouth 3 (three) times daily after meals.    HYDROcodone-acetaminophen (NORCO) 5-325 MG per tablet Take 1-2 tablets by mouth every 4 (four) hours as needed for pain. Qty: 120 tablet, Refills: 0    methocarbamol (ROBAXIN) 500 MG tablet Take 1 tablet (500 mg total) by mouth every 6 (six) hours as needed (muscle spasms). Qty: 50 tablet, Refills: 0      CONTINUE these medications which have CHANGED   Details  polyethylene glycol (MIRALAX / GLYCOLAX) packet Take 17 g by mouth 2 (two) times daily.       CONTINUE these medications which have NOT CHANGED   Details  dipyridamole-aspirin (AGGRENOX) 25-200 MG per 12 hr capsule Take 1 capsule by mouth 2 (two) times daily.    dutasteride (AVODART) 0.5 MG capsule Take 0.5 mg by mouth daily.    OVER THE COUNTER MEDICATION Take 1 capsule by mouth daily. Macular Protect Complete-S    solifenacin (VESICARE) 10 MG tablet Take 5 mg by mouth daily.      STOP taking these medications     acetaminophen (TYLENOL) 500 MG tablet Comments:  Reason for Stopping:       traMADol (ULTRAM) 50 MG tablet Comments:  Reason for Stopping:  Signed: Anastasio Auerbach. Jonnie Kubly   PAC  10/31/2011, 11:00 AM

## 2011-10-31 NOTE — Progress Notes (Signed)
Subjective: 2 Days Post-Op Procedure(s) (LRB): ARTHROPLASTY BIPOLAR HIP (Right)   Patient reports pain as mild, pain is under better control than it was yesterday. No events throughout the night. Ready to be discharged to SNF.  Objective:   VITALS:   Filed Vitals:   10/31/11 0630  BP: 162/66  Pulse: 78  Temp: 98.1 F (36.7 C)  Resp: 20    Incision: dressing C/D/I No cellulitis present Compartment soft  LABS  Basename 10/31/11 0410 10/30/11 0353 10/29/11 0930  HGB 10.6* 11.7* 12.9*  HCT 33.3* 35.9* 39.9  WBC 9.8 10.9* 10.1  PLT 249 269 225     Basename 10/31/11 0410 10/30/11 0353  NA 137 136  K 3.7 3.6  BUN 9 9  CREATININE 0.74 0.73  GLUCOSE 129* 134*     Assessment/Plan: 2 Days Post-Op Procedure(s) (LRB): ARTHROPLASTY BIPOLAR HIP (Right)   Up with therapy Discharge to SNF today Follow up in 2 weeks at [facility].  Follow-up Information    Follow up with OLIN,Kyian Obst D in 2 weeks.   Contact information:   [facility] [address] [place] [address] [phone]           Anastasio Auerbach. Curley Hogen   PAC  10/31/2011, 10:53 AM

## 2011-10-31 NOTE — Care Management Note (Signed)
    Page 1 of 2   10/31/2011     3:24:47 PM   CARE MANAGEMENT NOTE 10/31/2011  Patient:  [name] [name], [name] [name]   Account Number:  [id number]  Date Initiated:  10/31/2011  Documentation initiated by:  Colleen Can  Subjective/Objective Assessment:   dx fall sustaining femur fracture-right  Pt was transferred from [facility] to [facility] 05/20     Action/Plan:   SNF rehab   Anticipated DC Date:  11/01/2011   Anticipated DC Plan:  SKILLED NURSING FACILITY  In-house referral  Clinical Social Worker      DC Planning Services  CM consult      [facility] Choice  NA   Choice offered to / List presented to:  NA   DME arranged  NA      DME agency  NA     HH arranged  NA      HH agency  NA   Status of service:  Completed, signed off Medicare Important Message given?  NO (If response is "NO", the following Medicare IM given date fields will be blank) Date Medicare IM given:   Date Additional Medicare IM given:    Discharge Disposition:    Per UR Regulation:    If discussed at Long Length of Stay Meetings, dates discussed:    Comments:

## 2011-11-01 NOTE — Progress Notes (Signed)
Pt for D/C today to [facility]. Plan transport via EMS. Pt and family are agreeable to plans.  Kayleen Memos. Leighton Ruff [phone]

## 2011-11-01 NOTE — Progress Notes (Signed)
Clinical Social Work Department CLINICAL SOCIAL WORK PLACEMENT NOTE 11/01/2011  Patient:  [name] [name], [name] [name]  Account Number:  [id number] Admit date:  10/27/2011  Clinical Social Worker:  Tommi Emery, CLINICAL SOCIAL WORKER  Date/time:  11/01/2011 08:05 AM  Clinical Social Work is seeking post-discharge placement for this patient at the following level of care:   SKILLED NURSING   (*CSW will update this form in Epic as items are completed)   10/28/2011  Patient/family provided with [facility] Department of Clinical Social Work's list of facilities offering this level of care within the geographic area requested by the patient (or if unable, by the patient's family).  10/28/2011  Patient/family informed of their freedom to choose among providers that offer the needed level of care, that participate in Medicare, Medicaid or managed care program needed by the patient, have an available bed and are willing to accept the patient.  10/28/2011  Patient/family informed of [facility]' ownership interest in [facility], as well as of the fact that they are under no obligation to receive care at this facility.  PASARR submitted to EDS on 10/28/2011 PASARR number received from EDS on 10/28/2011  FL2 transmitted to all facilities in geographic area requested by pt/family on  10/28/2011 FL2 transmitted to all facilities within larger geographic area on 10/28/2011  Patient informed that his/her managed care company has contracts with or will negotiate with  certain facilities, including the following:     Patient/family informed of bed offers received:  10/28/2011 Patient chooses bed at [facility] Physician recommends and patient chooses bed at  [facility]  Patient to be transferred to [facility] on  10/31/2011 Patient to be transferred to facility by   The following physician request were entered in Epic:   Additional Comments:  Randell Detter C. Leighton Ruff [phone]

## 2011-11-03 HISTORY — PX: JOINT REPLACEMENT: SHX530

## 2011-12-23 ENCOUNTER — Encounter: Payer: Self-pay | Admitting: Neurosurgery

## 2011-12-24 ENCOUNTER — Ambulatory Visit (INDEPENDENT_AMBULATORY_CARE_PROVIDER_SITE_OTHER): Payer: Medicare Other | Admitting: Neurosurgery

## 2011-12-24 ENCOUNTER — Encounter: Payer: Self-pay | Admitting: Neurosurgery

## 2011-12-24 ENCOUNTER — Other Ambulatory Visit (INDEPENDENT_AMBULATORY_CARE_PROVIDER_SITE_OTHER): Payer: Medicare Other | Admitting: *Deleted

## 2011-12-24 ENCOUNTER — Ambulatory Visit (INDEPENDENT_AMBULATORY_CARE_PROVIDER_SITE_OTHER): Payer: Medicare Other | Admitting: *Deleted

## 2011-12-24 VITALS — BP 161/74 | HR 81 | Resp 18 | Ht 73.0 in | Wt 190.0 lb

## 2011-12-24 DIAGNOSIS — I714 Abdominal aortic aneurysm, without rupture, unspecified: Secondary | ICD-10-CM

## 2011-12-24 DIAGNOSIS — I6529 Occlusion and stenosis of unspecified carotid artery: Secondary | ICD-10-CM

## 2011-12-24 DIAGNOSIS — Z48812 Encounter for surgical aftercare following surgery on the circulatory system: Secondary | ICD-10-CM

## 2011-12-24 NOTE — Addendum Note (Signed)
Addended by: Sharee Pimple on: 12/24/2011 11:05 AM   Modules accepted: Orders

## 2011-12-24 NOTE — Progress Notes (Signed)
[facility] AAA/Carotid Office Note  CC: Annual AAA and carotid duplex Referring Physician:  Hart Rochester  History of Present Illness: 77 year old male patient of Dr. Hart Rochester followed for known AAA as well as carotid stenosis. The patient is status post right CEA with a known left ICA occlusion. Patient denies any signs or symptoms of CVA, TIA, amaurosis fugax however he does have neural deficit from previous CVA. Patient resides at a skilled nursing facility. The patient also denies any abdominal or back pain that is unusual.  Past Medical History  Diagnosis Date  . Coronary artery disease   . Angina   . Hypertension   . Stroke 1995, 2006    R side weak, slurred speech, exp. asphasia  . Anemia     when had stomach ulcer  . Headache   . Arthritis   . Abdominal aneurysm   . Disorder of heart muscle     "thickened heart muscle" per wife    ROS: [x]  Positive   [ ]  Denies    General: [ ]  Weight loss, [ ]  Fever, [ ]  chills Neurologic: [ ]  Dizziness, [ ]  Blackouts, [ ]  Seizure [ ]  Stroke, [ ]  "Mini stroke", [ ]  Slurred speech, [ ]  Temporary blindness; [ ]  weakness in arms or legs, [ ]  Hoarseness Cardiac: [ ]  Chest pain/pressure, [ ]  Shortness of breath at rest [ ]  Shortness of breath with exertion, [ ]  Atrial fibrillation or irregular heartbeat Vascular: [ ]  Pain in legs with walking, [ ]  Pain in legs at rest, [ ]  Pain in legs at night,  [ ]  Non-healing ulcer, [ ]  Blood clot in vein/DVT,   Pulmonary: [ ]  Home oxygen, [ ]  Productive cough, [ ]  Coughing up blood, [ ]  Asthma,  [ ]  Wheezing Musculoskeletal:  [ ]  Arthritis, [ ]  Low back pain, [ ]  Joint pain Hematologic: [ ]  Easy Bruising, [ ]  Anemia; [ ]  Hepatitis Gastrointestinal: [ ]  Blood in stool, [ ]  Gastroesophageal Reflux/heartburn, [ ]  Trouble swallowing Urinary: [ ]  chronic Kidney disease, [ ]  on HD - [ ]  MWF or [ ]  TTHS, [ ]  Burning with urination, [ ]  Difficulty urinating Skin: [ ]  Rashes, [ ]   Wounds Psychological: [ ]  Anxiety, [ ]  Depression   Social History History  Substance Use Topics  . Smoking status: Former Smoker    Types: Cigarettes    Quit date: 06/30/2003  . Smokeless tobacco: Not on file  . Alcohol Use: No    Family History No family history on file.  No Known Allergies  Current Outpatient Prescriptions  Medication Sig Dispense Refill  . diphenhydrAMINE (BENADRYL) 25 mg capsule Take 1 capsule (25 mg total) by mouth every 6 (six) hours as needed for itching, allergies or sleep.      . dipyridamole-aspirin (AGGRENOX) 25-200 MG per 12 hr capsule Take 1 capsule by mouth 2 (two) times daily.      Marland Kitchen dutasteride (AVODART) 0.5 MG capsule Take 0.5 mg by mouth daily.      . ferrous sulfate 325 (65 FE) MG tablet Take 1 tablet (325 mg total) by mouth 3 (three) times daily after meals.      Marland Kitchen OVER THE COUNTER MEDICATION Take 1 capsule by mouth daily. Macular Protect Complete-S      . solifenacin (VESICARE) 10 MG tablet Take 5 mg by mouth daily.        Physical Examination  There were no vitals filed for this visit.  There  is no height or weight on file to calculate BMI.  General:  WDWN in NAD Gait: Normal HEENT: WNL Eyes: Pupils equal Pulmonary: normal non-labored breathing , without Rales, rhonchi,  wheezing Cardiac: RRR, without  Murmurs, rubs or gallops; Abdomen: soft, NT, no masses Skin: no rashes, ulcers noted  Vascular Exam Pulses: 2+ radial pulses bilaterally, palpable femoral pulses bilaterally, no abdominal mass is palpated Carotid bruits: Right side carotid pulse to auscultation no bruits heard, left side is occluded Extremities without ischemic changes, no Gangrene , no cellulitis; no open wounds;  Musculoskeletal: no muscle wasting or atrophy   Neurologic: A&O X 3; Appropriate Affect ; SENSATION: normal; MOTOR FUNCTION:  moving all extremities equally. Speech is fluent/normal  Non-Invasive Vascular Imaging CAROTID DUPLEX 12/24/2011  Right ICA  0 - 19% stenosis Left ICA 0ccluded stenosis   ASSESSMENT/PLAN: This is a debilitated patient does reside in a skilled nursing facility due to his debilitation and neural deficit. Otherwise no acute problems noted, the patient will followup in one year with repeat carotid duplex as well as AAA duplex. He and his wife's questions were encouraged and answered.  Lauree Chandler ANP   Clinic MD: Early

## 2011-12-30 NOTE — Procedures (Unsigned)
DUPLEX ULTRASOUND OF ABDOMINAL AORTA  INDICATION:  AAA  HISTORY: Diabetes:  No Cardiac:  No Hypertension:  No Smoking:  No Connective Tissue Disorder: Family History: Previous Surgery:  No  DUPLEX EXAM:         AP (cm)                   TRANSVERSE (cm) Proximal             2.02 cm                   2.00 cm Mid                  3.84 cm                   4.07 cm Distal               3.98 cm                   4.12 cm Right Iliac          1.01 cm                   1.07 cm Left Iliac           Not visualized            Not visualized  PREVIOUS:  Date: 12/18/2010  AP:  4.2  TRANSVERSE:  4.1  IMPRESSION: 1. Abdominal aortic aneurysm noted in the distal abdominal aorta with     mural thrombus. 2. Stable maximum diameter when compared to the previous exam. 3. Left common iliac artery could not be visualized due to overlying     bowel gas.  ___________________________________________ Quita Skye. Hart Rochester, M.D.  EM/MEDQ  D:  12/24/2011  T:  12/24/2011  Job:  [id number]

## 2011-12-30 NOTE — Procedures (Unsigned)
CAROTID DUPLEX EXAM  INDICATION:  Right carotid endarterectomy, left ICA occlusion  HISTORY: Diabetes:  No Cardiac:  No Hypertension:  No Smoking:  No Previous Surgery:  Right CEA CV History:  History of CVA x2 Amaurosis Fugax No, Paresthesias No, Hemiparesis No                                      RIGHT             LEFT Brachial systolic pressure: Brachial Doppler waveforms: Vertebral direction of flow:        Antegrade DUPLEX VELOCITIES (cm/sec) CCA peak systolic                   54 ECA peak systolic                   133 ICA peak systolic                   62 ICA end diastolic                   16 PLAQUE MORPHOLOGY:                  Heterogeneous PLAQUE AMOUNT:                      Minimal PLAQUE LOCATION:                    ICA, CCA  IMPRESSION: 1. Right carotid endarterectomy is patent with no evidence of     restenosis of the internal carotid artery. 2. Left internal carotid artery known occlusion. 3. No significant change in comparison to the previous exam.  ___________________________________________ Quita Skye. Hart Rochester, M.D.  EM/MEDQ  D:  12/24/2011  T:  12/24/2011  Job:  [id number]

## 2011-12-31 ENCOUNTER — Other Ambulatory Visit: Payer: Self-pay | Admitting: *Deleted

## 2012-04-09 ENCOUNTER — Ambulatory Visit: Payer: Medicare Other | Admitting: Physical Therapy

## 2012-04-16 ENCOUNTER — Ambulatory Visit: Payer: Medicare Other | Attending: Orthopedic Surgery | Admitting: Physical Therapy

## 2012-04-16 DIAGNOSIS — Z96649 Presence of unspecified artificial hip joint: Secondary | ICD-10-CM | POA: Insufficient documentation

## 2012-04-16 DIAGNOSIS — IMO0001 Reserved for inherently not codable concepts without codable children: Secondary | ICD-10-CM | POA: Insufficient documentation

## 2012-04-16 DIAGNOSIS — M629 Disorder of muscle, unspecified: Secondary | ICD-10-CM | POA: Insufficient documentation

## 2012-04-16 DIAGNOSIS — M2420 Disorder of ligament, unspecified site: Secondary | ICD-10-CM | POA: Insufficient documentation

## 2012-04-16 DIAGNOSIS — R262 Difficulty in walking, not elsewhere classified: Secondary | ICD-10-CM | POA: Insufficient documentation

## 2012-04-28 ENCOUNTER — Ambulatory Visit: Payer: Medicare Other | Admitting: Physical Therapy

## 2012-04-30 ENCOUNTER — Ambulatory Visit: Payer: Medicare Other | Admitting: *Deleted

## 2012-05-05 ENCOUNTER — Ambulatory Visit: Payer: Medicare Other | Admitting: *Deleted

## 2012-05-12 ENCOUNTER — Ambulatory Visit: Payer: Medicare Other | Attending: Orthopedic Surgery | Admitting: *Deleted

## 2012-05-12 DIAGNOSIS — M629 Disorder of muscle, unspecified: Secondary | ICD-10-CM | POA: Insufficient documentation

## 2012-05-12 DIAGNOSIS — M2420 Disorder of ligament, unspecified site: Secondary | ICD-10-CM | POA: Insufficient documentation

## 2012-05-12 DIAGNOSIS — Z96649 Presence of unspecified artificial hip joint: Secondary | ICD-10-CM | POA: Insufficient documentation

## 2012-05-12 DIAGNOSIS — R262 Difficulty in walking, not elsewhere classified: Secondary | ICD-10-CM | POA: Insufficient documentation

## 2012-05-12 DIAGNOSIS — IMO0001 Reserved for inherently not codable concepts without codable children: Secondary | ICD-10-CM | POA: Insufficient documentation

## 2012-05-14 ENCOUNTER — Ambulatory Visit: Payer: Medicare Other | Admitting: Physical Therapy

## 2012-05-19 ENCOUNTER — Ambulatory Visit: Payer: Medicare Other | Admitting: Physical Therapy

## 2012-05-21 ENCOUNTER — Ambulatory Visit: Payer: Medicare Other | Admitting: Physical Therapy

## 2012-05-25 ENCOUNTER — Ambulatory Visit: Payer: Medicare Other | Admitting: Physical Therapy

## 2012-05-26 ENCOUNTER — Ambulatory Visit: Payer: Medicare Other | Admitting: Physical Therapy

## 2012-06-08 ENCOUNTER — Ambulatory Visit: Payer: Medicare Other | Admitting: Physical Therapy

## 2012-06-11 ENCOUNTER — Ambulatory Visit: Payer: Medicare Other | Attending: Orthopedic Surgery | Admitting: Physical Therapy

## 2012-06-11 DIAGNOSIS — Z96649 Presence of unspecified artificial hip joint: Secondary | ICD-10-CM | POA: Insufficient documentation

## 2012-06-11 DIAGNOSIS — R262 Difficulty in walking, not elsewhere classified: Secondary | ICD-10-CM | POA: Insufficient documentation

## 2012-06-11 DIAGNOSIS — IMO0001 Reserved for inherently not codable concepts without codable children: Secondary | ICD-10-CM | POA: Insufficient documentation

## 2012-06-11 DIAGNOSIS — M2420 Disorder of ligament, unspecified site: Secondary | ICD-10-CM | POA: Insufficient documentation

## 2012-06-11 DIAGNOSIS — M629 Disorder of muscle, unspecified: Secondary | ICD-10-CM | POA: Insufficient documentation

## 2012-06-16 ENCOUNTER — Ambulatory Visit: Payer: Medicare Other | Admitting: Physical Therapy

## 2012-06-18 ENCOUNTER — Ambulatory Visit: Payer: Medicare Other | Admitting: Physical Therapy

## 2012-06-23 ENCOUNTER — Ambulatory Visit: Payer: Medicare Other | Admitting: Physical Therapy

## 2012-06-25 ENCOUNTER — Ambulatory Visit: Payer: Medicare Other | Admitting: Physical Therapy

## 2012-06-30 ENCOUNTER — Ambulatory Visit: Payer: Medicare Other | Admitting: Physical Therapy

## 2012-07-02 ENCOUNTER — Ambulatory Visit: Payer: Medicare Other | Admitting: Physical Therapy

## 2012-07-07 ENCOUNTER — Ambulatory Visit: Payer: Medicare Other | Admitting: Physical Therapy

## 2012-07-09 ENCOUNTER — Ambulatory Visit: Payer: Medicare Other | Admitting: Physical Therapy

## 2012-07-14 ENCOUNTER — Ambulatory Visit: Payer: Medicare Other | Attending: Orthopedic Surgery | Admitting: Physical Therapy

## 2012-07-14 DIAGNOSIS — R262 Difficulty in walking, not elsewhere classified: Secondary | ICD-10-CM | POA: Insufficient documentation

## 2012-07-14 DIAGNOSIS — Z96649 Presence of unspecified artificial hip joint: Secondary | ICD-10-CM | POA: Insufficient documentation

## 2012-07-14 DIAGNOSIS — IMO0001 Reserved for inherently not codable concepts without codable children: Secondary | ICD-10-CM | POA: Insufficient documentation

## 2012-07-14 DIAGNOSIS — M629 Disorder of muscle, unspecified: Secondary | ICD-10-CM | POA: Insufficient documentation

## 2012-07-14 DIAGNOSIS — M2420 Disorder of ligament, unspecified site: Secondary | ICD-10-CM | POA: Insufficient documentation

## 2012-07-16 ENCOUNTER — Ambulatory Visit: Payer: Medicare Other | Admitting: Physical Therapy

## 2012-07-20 ENCOUNTER — Ambulatory Visit: Payer: Medicare Other | Admitting: Physical Therapy

## 2012-07-21 ENCOUNTER — Ambulatory Visit: Payer: Medicare Other | Admitting: Physical Therapy

## 2012-12-22 ENCOUNTER — Ambulatory Visit: Payer: Medicare Other | Admitting: Neurosurgery

## 2012-12-22 ENCOUNTER — Encounter (INDEPENDENT_AMBULATORY_CARE_PROVIDER_SITE_OTHER): Payer: Medicare Other | Admitting: *Deleted

## 2012-12-22 ENCOUNTER — Other Ambulatory Visit (INDEPENDENT_AMBULATORY_CARE_PROVIDER_SITE_OTHER): Payer: Medicare Other | Admitting: *Deleted

## 2012-12-22 DIAGNOSIS — I6529 Occlusion and stenosis of unspecified carotid artery: Secondary | ICD-10-CM

## 2012-12-22 DIAGNOSIS — I714 Abdominal aortic aneurysm, without rupture: Secondary | ICD-10-CM

## 2012-12-22 DIAGNOSIS — Z48812 Encounter for surgical aftercare following surgery on the circulatory system: Secondary | ICD-10-CM

## 2012-12-23 ENCOUNTER — Other Ambulatory Visit: Payer: Self-pay | Admitting: *Deleted

## 2012-12-23 DIAGNOSIS — I714 Abdominal aortic aneurysm, without rupture: Secondary | ICD-10-CM

## 2012-12-28 ENCOUNTER — Encounter: Payer: Self-pay | Admitting: Vascular Surgery

## 2013-01-01 ENCOUNTER — Encounter: Payer: Self-pay | Admitting: Neurology

## 2013-01-01 ENCOUNTER — Ambulatory Visit (INDEPENDENT_AMBULATORY_CARE_PROVIDER_SITE_OTHER): Payer: Medicare Other | Admitting: Neurology

## 2013-01-01 VITALS — BP 119/72 | HR 79

## 2013-01-01 DIAGNOSIS — Z96649 Presence of unspecified artificial hip joint: Secondary | ICD-10-CM

## 2013-01-01 DIAGNOSIS — Z8673 Personal history of transient ischemic attack (TIA), and cerebral infarction without residual deficits: Secondary | ICD-10-CM

## 2013-01-01 DIAGNOSIS — E785 Hyperlipidemia, unspecified: Secondary | ICD-10-CM

## 2013-01-01 NOTE — Progress Notes (Signed)
History of Present Illness:    [name] is a 78 year old right-handed white married male with a history of left brain stroke, patient of Dr. Sandria Manly, He is accompanied by his wife at today's clinical visit. Last visit was [name] 2014.  He had stroke with aphasia and right hemi-paresis in 11/01/1993. He has a history of left internal carotid artery occlusion and is status post right internal carotid endarterectomy in November 2005 by Dr. Jerilee Field. Dopplers by Dr. Hart Rochester who also follows an abdominal aortic aneurysm which is stable at 4.8 cm. In 12/2011. He has an aphasia with spastic right hemiparesis requiring a long right leg brace because of right knee extension while walking, four poster cane in his left hand, and  a motorized scooter.    He fell  In 05/2010 bruising his right forearm and hand. He fell again in 11/03/2010 fracturing his right humerus, followup by Doctor Charlann Boxer and did not have surgery.  He was admitted to [facility] for rehabilitation.  He was treated with a brace and sling which was removed and he got  good return to function in his right proximal arm.   He requires assistance in all activities of daily living since his fall with right humerus fracture. He fell again in 01/16/2011,  going to his younger sister's funeral and had a compression fracture, causing pain treated with Robaxin and tramadol. He fell in 06/30/2011 without apparent injury. He fell in 10/27/2011 with right hip fracture requiring surgery with arthroplasty, replacing the ball and putting a rod in the femur 10/29/2011 by Dr. Charlann Boxer. He had pain sitting.  He went home from [facility] 04/2012.  He can dress himself, bathe himself, and take care of his toileting. He has bladder incontinence. He uses depends.  He uses scooter sometimes.  He can stand holding on with his left hand. He is sleeping well.  His wife is struggling to take care of him at home. He walks in therapy as far as 140 feet without sitting down and with  assistance. He transfers well.  He has discontinued baclofen, hydrocodone/acetaminophen, and methocarbamol.  UPDATE July 25th 2014:  He had urinary tract infection in May 2014, had worsening confusion afterwards, he has difficulty feeding himself well, he forgot how to transfer himself, get agitated easily sometimes, he had MRI of the brain at [facility] a month later in June 2014, I have reviewed the report, there is evidence of left MCA encephalomalacia, extensive white matter disease, there was no acute lesions, evidence of left internal carotid artery occlusion, increased ventricular size, there was no acute stroke.    Physical Exam  General: Well-developed white male kyphotic,  Sitting in wheelchair  Neurologic Exam  Mental Status:  Alert. Fragmented,hesitant speech with neologisms and dysarthria. Has a Broca's  Aphasia.   Will follow two-step commands Cranial Nerves:  Good facial expression. Right palpebral fissure larger than the left. Dysarthria.Visual fields full on confrontational test. Decreased vision in his right from macular degeneration.Tongue to the right. Uvula midline.Gag is present. Hearing were decreased bilaterally. Sternocleidomastoid and trapezius testing normal. Sensation decreased in the face on the right versus the left Motor:  spastic right hemiparesis. Can open and close right hand and grip with the right hand..Right arm flexed at the elbow. 4/5 in the right upper extremity. Absent right index finger.  Can move his right arm  better proximally. Right foot drop. 4/5 at right hip flexion, 4/5 right knee flexion/knee extension, 2/5 right ankle dorsiflexion/right ankle plantar  flexion. Sensory:  Intact to light touch,  Gait and Station:  Deferred. Reflexes:  2+. Right plantar response upgoing.  Assessment and Plan:  79 year old Caucasian male, with history of large left MCA stroke, aphasia, spastic right hemiparesis, multiple fall, and fracture, including right hip  fracture, right humeral fracture.  1.  worsening functional status following his most recent urinary tract infection likely due to aging, deconditioning, there is no evidence of acute stroke on repeat MRI of the brain,  2.  continue physical therapy, 3.  Return to clinic in one year, call office for new issues

## 2013-01-04 ENCOUNTER — Ambulatory Visit: Payer: Medicare Other | Attending: Internal Medicine | Admitting: Physical Therapy

## 2013-01-04 DIAGNOSIS — IMO0001 Reserved for inherently not codable concepts without codable children: Secondary | ICD-10-CM | POA: Insufficient documentation

## 2013-01-04 DIAGNOSIS — R5381 Other malaise: Secondary | ICD-10-CM | POA: Insufficient documentation

## 2013-01-04 DIAGNOSIS — M6281 Muscle weakness (generalized): Secondary | ICD-10-CM | POA: Insufficient documentation

## 2013-01-04 DIAGNOSIS — R262 Difficulty in walking, not elsewhere classified: Secondary | ICD-10-CM | POA: Insufficient documentation

## 2013-01-07 ENCOUNTER — Ambulatory Visit: Payer: Medicare Other | Admitting: Physical Therapy

## 2013-01-11 ENCOUNTER — Ambulatory Visit: Payer: Medicare Other | Attending: Internal Medicine | Admitting: Physical Therapy

## 2013-01-11 DIAGNOSIS — R262 Difficulty in walking, not elsewhere classified: Secondary | ICD-10-CM | POA: Insufficient documentation

## 2013-01-11 DIAGNOSIS — M6281 Muscle weakness (generalized): Secondary | ICD-10-CM | POA: Insufficient documentation

## 2013-01-11 DIAGNOSIS — IMO0001 Reserved for inherently not codable concepts without codable children: Secondary | ICD-10-CM | POA: Insufficient documentation

## 2013-01-11 DIAGNOSIS — R5381 Other malaise: Secondary | ICD-10-CM | POA: Insufficient documentation

## 2013-01-12 ENCOUNTER — Ambulatory Visit: Payer: Medicare Other | Admitting: Physical Therapy

## 2013-01-19 ENCOUNTER — Ambulatory Visit: Payer: Medicare Other | Admitting: Physical Therapy

## 2013-01-21 ENCOUNTER — Ambulatory Visit: Payer: Medicare Other | Admitting: Physical Therapy

## 2013-02-01 ENCOUNTER — Ambulatory Visit: Payer: Medicare Other | Admitting: Physical Therapy

## 2013-02-02 ENCOUNTER — Ambulatory Visit: Payer: Medicare Other | Admitting: Physical Therapy

## 2013-02-04 ENCOUNTER — Ambulatory Visit: Payer: Medicare Other | Admitting: Physical Therapy

## 2013-02-09 ENCOUNTER — Ambulatory Visit: Payer: Medicare Other | Attending: Internal Medicine | Admitting: Physical Therapy

## 2013-02-09 DIAGNOSIS — IMO0001 Reserved for inherently not codable concepts without codable children: Secondary | ICD-10-CM | POA: Insufficient documentation

## 2013-02-09 DIAGNOSIS — R5381 Other malaise: Secondary | ICD-10-CM | POA: Insufficient documentation

## 2013-02-09 DIAGNOSIS — R262 Difficulty in walking, not elsewhere classified: Secondary | ICD-10-CM | POA: Insufficient documentation

## 2013-02-09 DIAGNOSIS — M6281 Muscle weakness (generalized): Secondary | ICD-10-CM | POA: Insufficient documentation

## 2013-02-11 ENCOUNTER — Ambulatory Visit: Payer: Medicare Other | Admitting: Physical Therapy

## 2013-02-15 ENCOUNTER — Telehealth: Payer: Self-pay | Admitting: Neurology

## 2013-02-15 ENCOUNTER — Emergency Department (HOSPITAL_COMMUNITY): Payer: Medicare Other

## 2013-02-15 ENCOUNTER — Encounter (HOSPITAL_COMMUNITY): Payer: Self-pay | Admitting: Emergency Medicine

## 2013-02-15 ENCOUNTER — Ambulatory Visit: Payer: Medicare Other | Admitting: Physical Therapy

## 2013-02-15 ENCOUNTER — Observation Stay (HOSPITAL_COMMUNITY)
Admission: EM | Admit: 2013-02-15 | Discharge: 2013-02-17 | Disposition: A | Discharge status: Home / Self Care | Payer: Medicare Other | Attending: Internal Medicine | Admitting: Internal Medicine

## 2013-02-15 DIAGNOSIS — Z79899 Other long term (current) drug therapy: Secondary | ICD-10-CM | POA: Insufficient documentation

## 2013-02-15 DIAGNOSIS — I10 Essential (primary) hypertension: Principal | ICD-10-CM | POA: Insufficient documentation

## 2013-02-15 DIAGNOSIS — G44009 Cluster headache syndrome, unspecified, not intractable: Secondary | ICD-10-CM | POA: Insufficient documentation

## 2013-02-15 DIAGNOSIS — Z96649 Presence of unspecified artificial hip joint: Secondary | ICD-10-CM

## 2013-02-15 DIAGNOSIS — Z8673 Personal history of transient ischemic attack (TIA), and cerebral infarction without residual deficits: Secondary | ICD-10-CM | POA: Insufficient documentation

## 2013-02-15 DIAGNOSIS — D72829 Elevated white blood cell count, unspecified: Secondary | ICD-10-CM | POA: Insufficient documentation

## 2013-02-15 DIAGNOSIS — I714 Abdominal aortic aneurysm, without rupture, unspecified: Secondary | ICD-10-CM | POA: Insufficient documentation

## 2013-02-15 DIAGNOSIS — E785 Hyperlipidemia, unspecified: Secondary | ICD-10-CM | POA: Diagnosis present

## 2013-02-15 DIAGNOSIS — N400 Enlarged prostate without lower urinary tract symptoms: Secondary | ICD-10-CM | POA: Insufficient documentation

## 2013-02-15 DIAGNOSIS — Z48812 Encounter for surgical aftercare following surgery on the circulatory system: Secondary | ICD-10-CM

## 2013-02-15 HISTORY — DX: Other peripheral vertigo, unspecified ear: H81.399

## 2013-02-15 HISTORY — DX: Unspecified urinary incontinence: R32

## 2013-02-15 HISTORY — DX: Bursitis of unspecified shoulder: M75.50

## 2013-02-15 HISTORY — DX: Cluster headache syndrome, unspecified, not intractable: G44.009

## 2013-02-15 HISTORY — DX: Dizziness and giddiness: R42

## 2013-02-15 HISTORY — DX: Orthostatic hypotension: I95.1

## 2013-02-15 LAB — URINALYSIS W MICROSCOPIC + REFLEX CULTURE
Glucose, UA: NEGATIVE mg/dL
Ketones, ur: 15 mg/dL — AB
Leukocytes, UA: NEGATIVE
pH: 7.5 (ref 5.0–8.0)

## 2013-02-15 LAB — BASIC METABOLIC PANEL
BUN: 12 mg/dL (ref 6–23)
Calcium: 9.4 mg/dL (ref 8.4–10.5)
Creatinine, Ser: 0.82 mg/dL (ref 0.50–1.35)
GFR calc Af Amer: >90 mL/min (ref >90)
GFR calc non Af Amer: 84 mL/min — ABNORMAL LOW (ref >90)
Potassium: 3.6 mEq/L (ref 3.5–5.1)

## 2013-02-15 LAB — CBC WITH DIFFERENTIAL/PLATELET
Basophils Absolute: 0.1 10*3/uL (ref 0.0–0.1)
Basophils Relative: 1 % (ref 0–1)
MCHC: 34.8 g/dL (ref 30.0–36.0)
Neutro Abs: 10.3 10*3/uL — ABNORMAL HIGH (ref 1.7–7.7)
Neutrophils Relative %: 80 % — ABNORMAL HIGH (ref 43–77)
Platelets: 195 10*3/uL (ref 150–400)
RDW: 13.4 % (ref 11.5–15.5)

## 2013-02-15 MED ORDER — SODIUM CHLORIDE 0.9 % IV SOLN
INTRAVENOUS | Status: DC
  Administered 2013-02-15: 20:00:00 via INTRAVENOUS
  Administered 2013-02-16: 1000 mL via INTRAVENOUS

## 2013-02-15 MED ORDER — CLOPIDOGREL BISULFATE 75 MG PO TABS: 75.0000 mg | ORAL_TABLET | Freq: Every day | ORAL | Status: DC

## 2013-02-15 MED ORDER — MORPHINE SULFATE 4 MG/ML IJ SOLN
4.0000 mg | INTRAMUSCULAR | Status: DC | PRN
  Administered 2013-02-16: 4 mg via INTRAVENOUS
  Filled 2013-02-15: qty 1

## 2013-02-15 MED ORDER — MORPHINE SULFATE 4 MG/ML IJ SOLN: 4.0000 mg | INTRAMUSCULAR | Status: DC | PRN

## 2013-02-15 MED ORDER — MORPHINE SULFATE 2 MG/ML IJ SOLN
2.0000 mg | INTRAMUSCULAR | Status: AC | PRN
  Administered 2013-02-15 – 2013-02-16 (×2): 2 mg via INTRAVENOUS
  Filled 2013-02-15 (×2): qty 1

## 2013-02-15 NOTE — H&P (Signed)
[facility] History and Physical  Patient: [name] [name]  ZOX:[id number]  DOB: 06/20/1934  DOA: 02/15/2013  Referring physician: Dr. Clarene Duke PCP: [name] Lacks, MD   Chief Complaint: Headache  HPI: [name] [name] is a 78 y.o. male with Past medical history of CVA, CAD, hypertension, BPH. He presented today with the complaint of progressively worsening headache since this morning. He denies any trauma or fall or injury. He denies any new focal neurological deficit or blurring of the vision or speech difficulties. He does have history of cluster headache and he mentions that it appears the same. Located in the frontal region nonradiating.  He denies any fever, neck pain, chest pain, shortness of breath, photophobia, phonophobia. He also has history of hypertension, and apparently was on some blood pressure medication in the past with which he developed a rash and after which she was asked not to take any further blood pressure medications.   Review of Systems: as mentioned in the history of present illness.  A Comprehensive review of the other systems is negative.  Past Medical History  Diagnosis Date  . Coronary artery disease   . Angina   . Hypertension   . Stroke 1995, 2006    R side weak, slurred speech, exp. asphasia  . Anemia     when had stomach ulcer  . Headache(784.0)   . Arthritis   . Abdominal aneurysm   . Disorder of heart muscle     "thickened heart muscle" per wife  . Carotid artery occlusion   . Bursitis, shoulder     left  . Dizzy spells   . Autonomic orthostatic hypotension   . Peripheral vertigo   . Cluster headaches   . Urinary incontinence    Past Surgical History  Procedure Laterality Date  . Tonsillectomy    . Hip arthroplasty  10/29/2011    Procedure: ARTHROPLASTY BIPOLAR HIP;  Surgeon: Shelda Pal, MD;  Location: [facility]S;  Service: Orthopedics;  Laterality: Right;  . Fracture surgery      both hands  . Fracture surgery      Right arm  X's 2, back  . Joint replacement  11/03/11    Right hip, Fx from a fall  . Carotid endarterectomy Right   . Hernia repair Left    Social History:  reports that he quit smoking about 9 years ago. His smoking use included Cigarettes. He smoked 0.00 packs per day. He has never used smokeless tobacco. He reports that he does not drink alcohol or use illicit drugs. Patient is coming from home. Get helps with his wife partially Independent for most of his  ADL.  Allergies  Allergen Reactions  . Other Hives and Itching    ALL B.P. MED'S-TOLD TO NOT TAKE BY MD.  Had a reaction believed to be hives and itching, but not sure if any other reactions were involved.      History reviewed. No pertinent family history.  Prior to Admission medications   Medication Sig Start Date End Date Taking? Authorizing Provider  Calcium Carbonate-Vit D-Min (CALCIUM 600 + MINERALS PO) Take 1 tablet by mouth 2 (two) times daily.    Yes Historical Provider, MD  dipyridamole-aspirin (AGGRENOX) 200-25 MG per 12 hr capsule Take 1 capsule by mouth 2 (two) times daily.   Yes Historical Provider, MD  dutasteride (AVODART) 0.5 MG capsule Take 0.5 mg by mouth every morning.    Yes Historical Provider, MD  methocarbamol (ROBAXIN) 500 MG tablet Take 500  mg by mouth 3 (three) times daily as needed (for muscle spasms).    Yes Historical Provider, MD  Multiple Vitamin (MULTIVITAMIN WITH MINERALS) TABS tablet Take 1 tablet by mouth daily. "Macular Protect Complete S"   Yes Historical Provider, MD  Multiple Vitamins-Minerals (MACULAR VITAMIN BENEFIT) TABS Take 1 tablet by mouth daily.   Yes Historical Provider, MD  polyethylene glycol (MIRALAX / GLYCOLAX) packet Take 17 g by mouth 2 (two) times daily as needed (for constipation).   Yes Historical Provider, MD  solifenacin (VESICARE) 5 MG tablet Take 5 mg by mouth every morning.    Yes Historical Provider, MD  traMADol (ULTRAM) 50 MG tablet Take 50 mg by mouth every 8 (eight) hours as  needed for pain.   Yes Historical Provider, MD  clopidogrel (PLAVIX) 75 MG tablet Take 1 tablet (75 mg total) by mouth daily. 02/15/13   Levert Feinstein, MD    Physical Exam: Filed Vitals:   02/15/13 2229 02/15/13 2237 02/15/13 2238 02/15/13 2239  BP: 202/92 185/106 164/112 185/106  Pulse: 79 82 90 82  Temp:      TempSrc:      Resp:      SpO2:    100%    General: Alert, Awake and Oriented to Time, Place and Person. Appear in no  distress Eyes: PERRL ENT: Oral Mucosa clear moist. Neck: No  JVD, no  Carotid Bruits  Cardiovascular: S1 and S2 Present, aortic systolic Murmur, Peripheral Pulses Present Respiratory: Bilateral Air entry equal and Decreased, Clear to Auscultation,  Abdomen: Bowel Sound Present, Soft and Non tender Skin: No  Rash Extremities: Bilateral  Pedal edema, no  calf tenderness Neurologic: The lateral weakness on the right leg and arm due to prior stroke, dysarthria unchanged as per wife.   Labs on Admission:  CBC:  Recent Labs Lab 02/15/13 1930  WBC 12.9*  NEUTROABS 10.3*  HGB 16.9  HCT 48.6  MCV 90.0  PLT 195    CMP     Component Value Date/Time   NA 139 02/15/2013 1930   K 3.6 02/15/2013 1930   CL 102 02/15/2013 1930   CO2 25 02/15/2013 1930   GLUCOSE 138* 02/15/2013 1930   BUN 12 02/15/2013 1930   CREATININE 0.82 02/15/2013 1930   CALCIUM 9.4 02/15/2013 1930   GFRNONAA 84* 02/15/2013 1930   GFRAA >90 02/15/2013 1930    No results found for this basename: LIPASE, AMYLASE,  in the last 168 hours No results found for this basename: AMMONIA,  in the last 168 hours  Cardiac Enzymes:  Recent Labs Lab 02/15/13 1930  TROPONINI <0.30    BNP (last 3 results) No results found for this basename: PROBNP,  in the last 8760 hours  Radiological Exams on Admission: Dg Chest 2 View  02/15/2013   *RADIOLOGY REPORT*  Clinical Data: Headache.  Hypertension.  Altered mental status.  CHEST - 2 VIEW  Comparison: Chest x-ray 10/27/2011.  Findings: Lung volumes are low.  There are  coarse interstitial markings scattered throughout the lungs bilaterally.  Some bibasilar opacities are also noted, favored to reflect areas of subsegmental atelectasis.  No definite consolidative airspace disease.  No pleural effusions.  No evidence of pulmonary edema. Heart size is normal. The patient is rotated to the left on today's exam, resulting in distortion of the mediastinal contours and reduced diagnostic sensitivity and specificity for mediastinal pathology.  Atherosclerosis in the thoracic aorta.  Probable old healed fracture of the right proximal humerus incidentally noted.  IMPRESSION: 1.  Low lung volumes without definite radiographic evidence of acute cardiopulmonary disease. 2.  Patchy coarse interstitial markings throughout the lungs bilaterally, favored to reflect areas of chronic scarring. Underlying interstitial lung disease is not excluded, and attention on follow-up studies is recommended. 3.  Atherosclerosis.   Original Report Authenticated By: Trudie Reed, M.D.   Ct Head Wo Contrast  02/15/2013   *RADIOLOGY REPORT*  Clinical Data: Headache with hypertension; aphasia  CT HEAD WITHOUT CONTRAST  Technique:  Contiguous axial images were obtained from the base of the skull through the vertex without contrast. Study was obtained within 24 hours of patient arrival at the emergency department.  Comparison:  Oct 27, 2011  Findings:  There is mild diffuse atrophy.  There is no mass, hemorrhage, extra-axial fluid collection, or midline shift.  There is evidence of extensive small vessel disease throughout the centra semiovale bilaterally, stable.  There is evidence of a prior infarct involving portions of the posterior left frontal and much of the left parietal lobes.  This infarct is stable.  There is no new gray-white compartment lesions.  No acute infarct. Bony calvarium appears intact.  Mastoid air cells are clear.  IMPRESSION:  Atrophy with extensive small vessel disease and sizeable prior  infarct in the left.  There is no mass, hemorrhage, or acute appearing infarct.   Original Report Authenticated By: Bretta Bang, M.D.    EKG: Independently reviewed. unchanged from previous tracings.  Assessment/Plan Principal Problem:   Accelerated hypertension Active Problems:   HLD (hyperlipidemia)   History of CVA (cerebrovascular accident)   Abdominal aneurysm without mention of rupture   Cluster headache   1. Accelerated hypertension The patient does have elevated blood pressure. He does have history of hypertension but currently is not taking any medication probably because of vision in the history of orthostasis and rash with prior one of the blood pressure medications. Currently do to his headache his blood pressure has been significantly elevated. CT scan of the head is negative. Patient does not have any acute neurological complaint. Considering that I will gradually control the headache which will improve the blood pressure. Also I would add when necessary hydralazine and one dose of Norvasc for acute blood pressure control.  2. history of CVA Continue aspiration precaution May benefit from PTOT in the morning Monitor neurochecks Patient's neurologist recently changed him to Plavix, therefore I would continue him on Plavix  3. Headache Patient does have history of cluster headaches, mentions his symptoms are currently similar to that. Does not have any photophobia or signs of meningism. Does not have any neurological deficit. Considering that I will add Fiorinal for and Norco for symptom improvement.  4.BPH  Continue Avodart   DVT Prophylaxis: subcutaneous Heparin Nutrition: Advised as tolerated   Code Status: Full   Family Communication: Wife was at bedside   Author: Lynden Oxford, MD [facility] Pager: [phone] 02/15/2013, 11:43 PM    If 7PM-7AM, please contact night-coverage [email or web address] Password [email or web address]

## 2013-02-15 NOTE — ED Notes (Signed)
ED EKG given to Dr. Clarene Duke .

## 2013-02-15 NOTE — Telephone Encounter (Signed)
Patient can no longer afford Aggrenox, as his co-pay is now $400.  He has Medicare.  Is there a different medication he can be changed to?  Please advise.  Thank you.

## 2013-02-15 NOTE — Telephone Encounter (Signed)
Charted reviewed, history of stroke, will switch him to plavix 75mg  qday. Rx called in. Let him know

## 2013-02-15 NOTE — ED Provider Notes (Signed)
CSN: [id number]     Arrival date & time 02/15/13  1826 History   First MD Initiated Contact with Patient 02/15/13 1929     Chief Complaint  Patient presents with  . Headache  . Hypertension   HPI Pt was seen at 1915.  Per pt and his wife, c/o gradual onset and persistence of constant acute flair of his chronic headache since this morning.  Describes the headache as located in his forehead.  Denies headache was sudden or maximal in onset or at any time.  Pt's wife states she took his BP today and "it was 210/110." States he was "on a BP med once but he stopped it because he got a rash." Pt's wife does not know the name of the medicine. Pt's wife also states that his Neuro MD Love "told us not to start any other BP medicine ever" but she cannot recall the reason. Denies visual changes, no new focal motor weakness, no new tingling/numbness in extremities, no fevers, no neck pain, no rash, no CP/SOB, no abd pain, no N/V/D.      Past Medical History  Diagnosis Date  . Coronary artery disease   . Angina   . Hypertension   . Stroke 1995, 2006    R side weak, slurred speech, exp. asphasia  . Anemia     when had stomach ulcer  . Headache(784.0)   . Arthritis   . Abdominal aneurysm   . Disorder of heart muscle     "thickened heart muscle" per wife  . Carotid artery occlusion   . Bursitis, shoulder     left  . Dizzy spells   . Autonomic orthostatic hypotension   . Peripheral vertigo   . Cluster headaches   . Urinary incontinence    Past Surgical History  Procedure Laterality Date  . Tonsillectomy    . Hip arthroplasty  10/29/2011    Procedure: ARTHROPLASTY BIPOLAR HIP;  Surgeon: Shelda Pal, MD;  Location: [facility];  Service: Orthopedics;  Laterality: Right;  . Fracture surgery      both hands  . Fracture surgery      Right arm X's 2, back  . Joint replacement  11/03/11    Right hip, Fx from a fall  . Carotid endarterectomy Right   . Hernia repair Left     History  Substance Use  Topics  . Smoking status: Former Smoker    Types: Cigarettes    Quit date: 06/30/2003  . Smokeless tobacco: Never Used  . Alcohol Use: No    Review of Systems ROS: Statement: All systems negative except as marked or noted in the HPI; Constitutional: Negative for fever and chills. ; ; Eyes: Negative for eye pain, redness and discharge. ; ; ENMT: Negative for ear pain, hoarseness, nasal congestion, sinus pressure and sore throat. ; ; Cardiovascular: Negative for chest pain, palpitations, diaphoresis, dyspnea and peripheral edema. ; ; Respiratory: Negative for cough, wheezing and stridor. ; ; Gastrointestinal: Negative for nausea, vomiting, diarrhea, abdominal pain, blood in stool, hematemesis, jaundice and rectal bleeding. . ; ; Genitourinary: Negative for dysuria, flank pain and hematuria. ; ; Musculoskeletal: Negative for back pain and neck pain. Negative for swelling and trauma.; ; Skin: Negative for pruritus, rash, abrasions, blisters, bruising and skin lesion.; ; Neuro: +frontal headache. Negative for lightheadedness and neck stiffness. Negative for weakness, altered level of consciousness , altered mental status, extremity weakness, paresthesias, involuntary movement, seizure and syncope.      Allergies  Other  Home Medications   Current Outpatient Rx  Name  Route  Sig  Dispense  Refill  . Calcium Carbonate-Vit D-Min (CALCIUM 600 + MINERALS PO)   Oral   Take 1 tablet by mouth 2 (two) times daily.          Marland Kitchen dipyridamole-aspirin (AGGRENOX) 200-25 MG per 12 hr capsule   Oral   Take 1 capsule by mouth 2 (two) times daily.         Marland Kitchen dutasteride (AVODART) 0.5 MG capsule   Oral   Take 0.5 mg by mouth every morning.          . methocarbamol (ROBAXIN) 500 MG tablet   Oral   Take 500 mg by mouth 3 (three) times daily as needed (for muscle spasms).          . Multiple Vitamin (MULTIVITAMIN WITH MINERALS) TABS tablet   Oral   Take 1 tablet by mouth daily. "Macular Protect  Complete S"         . Multiple Vitamins-Minerals (MACULAR VITAMIN BENEFIT) TABS   Oral   Take 1 tablet by mouth daily.         . polyethylene glycol (MIRALAX / GLYCOLAX) packet   Oral   Take 17 g by mouth 2 (two) times daily as needed (for constipation).         . solifenacin (VESICARE) 5 MG tablet   Oral   Take 5 mg by mouth every morning.          . traMADol (ULTRAM) 50 MG tablet   Oral   Take 50 mg by mouth every 8 (eight) hours as needed for pain.         Marland Kitchen clopidogrel (PLAVIX) 75 MG tablet   Oral   Take 1 tablet (75 mg total) by mouth daily.   30 tablet   12    BP 213/102  Pulse 81  Temp(Src) 98.5 F (36.9 C) (Oral)  Resp 21  SpO2 96% Physical Exam 1920: Physical examination:  Nursing notes reviewed; Vital signs and O2 SAT reviewed;  Constitutional: Well developed, Well nourished, Well hydrated, In no acute distress; Head:  Normocephalic, atraumatic; Eyes: EOMI, PERRL, No scleral icterus; ENMT: TM's clear bilat. +tender to percuss over frontal sinus. +edemetous nasal turbinates bilat with clear rhinorrhea.  Mouth and pharynx normal, Mucous membranes moist; Neck: Supple, Full range of motion, No lymphadenopathy; Cardiovascular: Regular rate and rhythm, No gallop; Respiratory: Breath sounds clear & equal bilaterally, No wheezes.  Speaking full sentences with ease, Normal respiratory effort/excursion; Chest: Nontender, Movement normal; Abdomen: Soft, Nontender, Nondistended, Normal bowel sounds; Genitourinary: No CVA tenderness; Extremities: Pulses normal, No tenderness, No edema, No calf edema or asymmetry.; Neuro: AA&Ox3, Major CN grossly intact. +aphasic with right hemiparesis per baseline. Moves LUE and LLE spontaneously without apparent focal motor deficits..; Skin: Color normal, Warm, Dry.   ED Course  Procedures     MDM  MDM Reviewed: previous chart, nursing note and vitals Reviewed previous: labs and ECG Interpretation: labs, ECG, x-ray and CT  scan      Date: 02/15/2013  Rate: 99  Rhythm: sinus tachycardia and premature ventricular contractions (PVC)  QRS Axis: left  Intervals: normal  ST/T Wave abnormalities: normal  Conduction Disutrbances:right bundle branch block  Narrative Interpretation:   Old EKG Reviewed: unchanged; no significant changes compared to previous EKG dated 10/27/2011.  Results for orders placed during the hospital encounter of 02/15/13  URINALYSIS W MICROSCOPIC + REFLEX CULTURE  Result Value Range   Color, Urine YELLOW  YELLOW   APPearance CLEAR  CLEAR   Specific Gravity, Urine 1.016  1.005 - 1.030   pH 7.5  5.0 - 8.0   Glucose, UA NEGATIVE  NEGATIVE mg/dL   Hgb urine dipstick NEGATIVE  NEGATIVE   Bilirubin Urine NEGATIVE  NEGATIVE   Ketones, ur 15 (*) NEGATIVE mg/dL   Protein, ur 478 (*) NEGATIVE mg/dL   Urobilinogen, UA 0.2  0.0 - 1.0 mg/dL   Nitrite NEGATIVE  NEGATIVE   Leukocytes, UA NEGATIVE  NEGATIVE   WBC, UA 0-2  <3 WBC/hpf   RBC / HPF 0-2  <3 RBC/hpf   Bacteria, UA RARE  RARE   Squamous Epithelial / LPF RARE  RARE   Urine-Other AMORPHOUS URATES/PHOSPHATES    BASIC METABOLIC PANEL      Result Value Range   Sodium 139  135 - 145 mEq/L   Potassium 3.6  3.5 - 5.1 mEq/L   Chloride 102  96 - 112 mEq/L   CO2 25  19 - 32 mEq/L   Glucose, Bld 138 (*) 70 - 99 mg/dL   BUN 12  6 - 23 mg/dL   Creatinine, Ser 2.95  0.50 - 1.35 mg/dL   Calcium 9.4  8.4 - 62.1 mg/dL   GFR calc non Af Amer 84 (*) >90 mL/min   GFR calc Af Amer >90  >90 mL/min  CBC WITH DIFFERENTIAL      Result Value Range   WBC 12.9 (*) 4.0 - 10.5 K/uL   RBC 5.40  4.22 - 5.81 MIL/uL   Hemoglobin 16.9  13.0 - 17.0 g/dL   HCT 30.8  65.7 - 84.6 %   MCV 90.0  78.0 - 100.0 fL   MCH 31.3  26.0 - 34.0 pg   MCHC 34.8  30.0 - 36.0 g/dL   RDW 96.2  95.2 - 84.1 %   Platelets 195  150 - 400 K/uL   Neutrophils Relative % 80 (*) 43 - 77 %   Neutro Abs 10.3 (*) 1.7 - 7.7 K/uL   Lymphocytes Relative 10 (*) 12 - 46 %   Lymphs Abs  1.2  0.7 - 4.0 K/uL   Monocytes Relative 8  3 - 12 %   Monocytes Absolute 1.0  0.1 - 1.0 K/uL   Eosinophils Relative 2  0 - 5 %   Eosinophils Absolute 0.2  0.0 - 0.7 K/uL   Basophils Relative 1  0 - 1 %   Basophils Absolute 0.1  0.0 - 0.1 K/uL  TROPONIN I      Result Value Range   Troponin I <0.30  <0.30 ng/mL   Dg Chest 2 View 02/15/2013   *RADIOLOGY REPORT*  Clinical Data: Headache.  Hypertension.  Altered mental status.  CHEST - 2 VIEW  Comparison: Chest x-ray 10/27/2011.  Findings: Lung volumes are low.  There are coarse interstitial markings scattered throughout the lungs bilaterally.  Some bibasilar opacities are also noted, favored to reflect areas of subsegmental atelectasis.  No definite consolidative airspace disease.  No pleural effusions.  No evidence of pulmonary edema. Heart size is normal. The patient is rotated to the left on today's exam, resulting in distortion of the mediastinal contours and reduced diagnostic sensitivity and specificity for mediastinal pathology.  Atherosclerosis in the thoracic aorta.  Probable old healed fracture of the right proximal humerus incidentally noted.  IMPRESSION: 1.  Low lung volumes without definite radiographic evidence of acute cardiopulmonary disease. 2.  Patchy coarse interstitial markings throughout the lungs bilaterally, favored to reflect areas of chronic scarring. Underlying interstitial lung disease is not excluded, and attention on follow-up studies is recommended. 3.  Atherosclerosis.   Original Report Authenticated By: Trudie Reed, M.D.   Ct Head Wo Contrast 02/15/2013   *RADIOLOGY REPORT*  Clinical Data: Headache with hypertension; aphasia  CT HEAD WITHOUT CONTRAST  Technique:  Contiguous axial images were obtained from the base of the skull through the vertex without contrast. Study was obtained within 24 hours of patient arrival at the emergency department.  Comparison:  Oct 27, 2011  Findings:  There is mild diffuse atrophy.  There is  no mass, hemorrhage, extra-axial fluid collection, or midline shift.  There is evidence of extensive small vessel disease throughout the centra semiovale bilaterally, stable.  There is evidence of a prior infarct involving portions of the posterior left frontal and much of the left parietal lobes.  This infarct is stable.  There is no new gray-white compartment lesions.  No acute infarct. Bony calvarium appears intact.  Mastoid air cells are clear.  IMPRESSION:  Atrophy with extensive small vessel disease and sizeable prior infarct in the left.  There is no mass, hemorrhage, or acute appearing infarct.   Original Report Authenticated By: Bretta Bang, M.D.    2235:  EPIC chart reviewed: pt with hx of cluster headaches. Pt given multiple doses of IV morphine without sustained headache relief. BP will drop to 178/95 then slowly increase again. Denies CP/SOB. O2 N/C in place. Will remedicate for pain. T/C to [facility]. Allena Katz, case discussed, including:  HPI, pertinent PM/SHx, VS/PE, dx testing, ED course and treatment:  Agreeable to observation admit, requests to write temporary orders, obtain tele bed to team 10.     Laray Anger, DO 02/16/13 2216

## 2013-02-15 NOTE — ED Notes (Signed)
Pt c/o HA; pt noted to be hypertensive; pt with hx of CVA x 2 and is aphasic at baseline

## 2013-02-16 ENCOUNTER — Encounter (HOSPITAL_COMMUNITY): Payer: Self-pay | Admitting: *Deleted

## 2013-02-16 DIAGNOSIS — I10 Essential (primary) hypertension: Secondary | ICD-10-CM | POA: Diagnosis present

## 2013-02-16 DIAGNOSIS — G44009 Cluster headache syndrome, unspecified, not intractable: Secondary | ICD-10-CM | POA: Diagnosis present

## 2013-02-16 LAB — BASIC METABOLIC PANEL
BUN: 11 mg/dL (ref 6–23)
CO2: 26 mEq/L (ref 19–32)
Chloride: 103 mEq/L (ref 96–112)
GFR calc Af Amer: >90 mL/min (ref >90)
Glucose, Bld: 123 mg/dL — ABNORMAL HIGH (ref 70–99)
Potassium: 3.6 mEq/L (ref 3.5–5.1)

## 2013-02-16 LAB — CBC
HCT: 47.4 % (ref 39.0–52.0)
Hemoglobin: 16.8 g/dL (ref 13.0–17.0)
MCHC: 35.4 g/dL (ref 30.0–36.0)

## 2013-02-16 LAB — PROTIME-INR: INR: 1.02 (ref 0.00–1.49)

## 2013-02-16 LAB — TROPONIN I: Troponin I: <0.3 ng/mL (ref <0.30)

## 2013-02-16 MED ORDER — LORAZEPAM 2 MG/ML IJ SOLN: 0.5000 mg | INTRAMUSCULAR | Status: DC | PRN

## 2013-02-16 MED ORDER — AMLODIPINE BESYLATE 5 MG PO TABS
5.0000 mg | ORAL_TABLET | Freq: Every day | ORAL | Status: AC
  Filled 2013-02-16: qty 1

## 2013-02-16 MED ORDER — NITROGLYCERIN 2 % TD OINT
0.5000 [in_us] | TOPICAL_OINTMENT | Freq: Four times a day (QID) | TRANSDERMAL | Status: DC
  Administered 2013-02-16 – 2013-02-17 (×6): 0.5 [in_us] via TOPICAL
  Filled 2013-02-16: qty 30

## 2013-02-16 MED ORDER — CLOPIDOGREL BISULFATE 75 MG PO TABS
75.0000 mg | ORAL_TABLET | Freq: Every day | ORAL | Status: DC
  Administered 2013-02-16 – 2013-02-17 (×2): 75 mg via ORAL
  Filled 2013-02-16 (×2): qty 1

## 2013-02-16 MED ORDER — SODIUM CHLORIDE 0.9 % IJ SOLN
3.0000 mL | Freq: Two times a day (BID) | INTRAMUSCULAR | Status: DC
  Administered 2013-02-16 – 2013-02-17 (×3): 3 mL via INTRAVENOUS

## 2013-02-16 MED ORDER — ACETAMINOPHEN 650 MG RE SUPP: 650.0000 mg | Freq: Four times a day (QID) | RECTAL | Status: DC | PRN

## 2013-02-16 MED ORDER — DUTASTERIDE 0.5 MG PO CAPS
0.5000 mg | ORAL_CAPSULE | Freq: Every morning | ORAL | Status: DC
  Administered 2013-02-16 – 2013-02-17 (×2): 0.5 mg via ORAL
  Filled 2013-02-16 (×2): qty 1

## 2013-02-16 MED ORDER — ACETAMINOPHEN 325 MG PO TABS
650.0000 mg | ORAL_TABLET | Freq: Four times a day (QID) | ORAL | Status: DC | PRN
  Administered 2013-02-17: 650 mg via ORAL
  Filled 2013-02-16: qty 2

## 2013-02-16 MED ORDER — HYDRALAZINE HCL 20 MG/ML IJ SOLN
10.0000 mg | Freq: Four times a day (QID) | INTRAMUSCULAR | Status: DC | PRN
  Administered 2013-02-16 – 2013-02-17 (×2): 10 mg via INTRAVENOUS
  Filled 2013-02-16 (×2): qty 1

## 2013-02-16 MED ORDER — TRAMADOL HCL 50 MG PO TABS
50.0000 mg | ORAL_TABLET | Freq: Three times a day (TID) | ORAL | Status: DC | PRN
  Administered 2013-02-16 – 2013-02-17 (×2): 50 mg via ORAL
  Filled 2013-02-16 (×2): qty 1

## 2013-02-16 MED ORDER — SODIUM CHLORIDE 0.9 % IV SOLN: INTRAVENOUS | Status: DC

## 2013-02-16 MED ORDER — ISOSORBIDE MONONITRATE ER 30 MG PO TB24
30.0000 mg | ORAL_TABLET | Freq: Every day | ORAL | Status: DC
  Administered 2013-02-17: 30 mg via ORAL
  Filled 2013-02-16: qty 1

## 2013-02-16 MED ORDER — METHOCARBAMOL 500 MG PO TABS: 500.0000 mg | ORAL_TABLET | Freq: Three times a day (TID) | ORAL | Status: DC | PRN

## 2013-02-16 MED ORDER — ONDANSETRON HCL 4 MG/2ML IJ SOLN: 4.0000 mg | Freq: Four times a day (QID) | INTRAMUSCULAR | Status: DC | PRN

## 2013-02-16 MED ORDER — ENSURE COMPLETE PO LIQD
237.0000 mL | ORAL | Status: DC
  Administered 2013-02-16 – 2013-02-17 (×2): 237 mL via ORAL

## 2013-02-16 MED ORDER — POLYETHYLENE GLYCOL 3350 17 G PO PACK
17.0000 g | PACK | Freq: Two times a day (BID) | ORAL | Status: DC | PRN
  Filled 2013-02-16: qty 1

## 2013-02-16 MED ORDER — BUSPIRONE HCL 15 MG PO TABS
7.5000 mg | ORAL_TABLET | Freq: Two times a day (BID) | ORAL | Status: DC
  Administered 2013-02-16 – 2013-02-17 (×2): 7.5 mg via ORAL
  Filled 2013-02-16 (×4): qty 1

## 2013-02-16 MED ORDER — LORAZEPAM 2 MG/ML IJ SOLN: 0.5000 mg | INTRAMUSCULAR | Status: DC

## 2013-02-16 MED ORDER — ONDANSETRON HCL 4 MG PO TABS: 4.0000 mg | ORAL_TABLET | Freq: Four times a day (QID) | ORAL | Status: DC | PRN

## 2013-02-16 NOTE — Progress Notes (Signed)
[name] [name]:[id number] DOB: 12/13/1934 DOA: 02/15/2013 PCP: Evie Lacks, MD  Brief narrative: 78 yr old ?, known h/o multiple CVA L side 1995,[sntinel event]c R hemiparesis/aphasia, history of ICA occlusion/RICa endarterectomy 04/2004, known triple A,  right femoral neck fracture status post repair 10/27/2011, last carotid duplex 12/24/2011 negative for stenosis,, known right humeral fracture repair 11/03/2010, kno wn history of dizziness and orthostasis previously followed by neurology, known nephrolithiasis admitted to [facility] 02/15/13 with worsening headache His wife was a former OB/GYN nurse states to me that he used to have cluster headaches as a younger man when she first met him about 50 years ago He states that the pain is located in the frontal regions non-radiating. He did not have any sick ascitic focal neurological findings and has chronic deficits secondary to massive CVA in 1995. He had been told in the remote past not to take blood pressure medications by Dr. love however since then his blood pressure seems to have crept up.  Past medical history-As per Problem list Chart reviewed as below- See above  Consultants:  Neurology  Procedures:  CT scan head  Antibiotics:  None   Subjective  Relatively aphasic-wife at bedside No specific complaint and states that he still has headaches Denies shortness of breath chest pain nausea Denies blurred or double vision currently    Objective    Interim History: none  Telemetry: NSR  Objective: Filed Vitals:   02/16/13 0415 02/16/13 0419 02/16/13 0615 02/16/13 0622  BP: 216/102 216/102 165/81 195/89  Pulse: 70  65 66  Temp: 97.6 F (36.4 C)  98.1 F (36.7 C)   TempSrc:      Resp: 18  18 19   SpO2: 97%  99%    No intake or output data in the 24 hours ending 02/16/13 0836  Exam:  General: EOMI.  Assymmetric face.  Cardiovascular:  s1 s2 no m/r/g Respiratory: clear Abdomen:  Soft,  NTND Skinsoft NeuroIntact on L side.  Dense RUE hemiparesis, decreased power R side  Data Reviewed: Basic Metabolic Panel:  Recent Labs Lab 02/15/13 1930 02/16/13 0628  NA 139 139  K 3.6 3.6  CL 102 103  CO2 25 26  GLUCOSE 138* 123*  BUN 12 11  CREATININE 0.82 0.82  CALCIUM 9.4 9.3   Liver Function Tests: No results found for this basename: AST, ALT, ALKPHOS, BILITOT, PROT, ALBUMIN,  in the last 168 hours No results found for this basename: LIPASE, AMYLASE,  in the last 168 hours No results found for this basename: AMMONIA,  in the last 168 hours CBC:  Recent Labs Lab 02/15/13 1930 02/16/13 0628  WBC 12.9* 13.1*  NEUTROABS 10.3*  --   HGB 16.9 16.8  HCT 48.6 47.4  MCV 90.0 90.8  PLT 195 190   Cardiac Enzymes:  Recent Labs Lab 02/15/13 1930 02/16/13 0120 02/16/13 0628  TROPONINI <0.30 <0.30 <0.30   BNP: No components found with this basename: POCBNP,  CBG:  Recent Labs Lab 02/16/13 0650  GLUCAP 134*    No results found for this or any previous visit (from the past 240 hour(s)).   Studies:              All Imaging reviewed and is as per above notation   Scheduled Meds: . amLODipine  5 mg Oral Daily  . clopidogrel  75 mg Oral Daily  . dutasteride  0.5 mg Oral q morning - 10a  . nitroGLYCERIN  0.5 inch  Topical Q6H  . sodium chloride  3 mL Intravenous Q12H   Continuous Infusions:    Assessment/Plan: 1. Headache proabbaly2/2 to [facility]-added Nitro paste 0.5 inches.  Unlikely this is new onset. CVA-i will curbside Neurology regarding concerns about BP's etc but think this will resolve-note that he is already on Plavix 75 daily.  D/c morphine.  OKay for tramadol 50 q 8 prn HA 2. Hypertensive urgency-see above-continue Amlodipine [not given 9/8 pm] 3. Leukocytosis-Unclear etiology.  No fever.  Monitor.   4. Dysphagia concern-Speech has cleared for reg diet today-appreciate input 5. H/o Multiple vascular events including 2 CVa's, Carotid  atherosclerosis s/p intervention-continue Plavix 75 daily 6. Known h/o dizzy/orthostasis  Code Status: full Family Communication: discussed with wife @ bedsdie Disposition Plan: inpatient, ?2-3 d.   Pleas Koch, MD  [facility] Pager [phone] 02/16/2013, 8:36 AM    LOS: 1 day

## 2013-02-16 NOTE — Progress Notes (Signed)
Norvasc 5mg  x1 was not given d/t pt failed the bedside swallow eval and  remained NPO, SLP to eval. Pt still c/o intermittent severe HA, x2 doses of Morphine iv given as ordered which actually helps relieved HA for few hours. Will continue to monitor.
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
## 2013-02-16 NOTE — Evaluation (Signed)
Clinical/Bedside Swallow Evaluation Patient Details  Name: [name] [name] MRN: [id number] Date of Birth: May 05, 1935  Today's Date: 02/16/2013 Time: 0800-0837 SLP Time Calculation (min): 37 min  Past Medical History:  Past Medical History  Diagnosis Date  . Coronary artery disease   . Angina   . Hypertension   . Stroke 1995, 2006    R side weak, slurred speech, exp. asphasia  . Anemia     when had stomach ulcer  . Headache(784.0)   . Arthritis   . Abdominal aneurysm   . Disorder of heart muscle     "thickened heart muscle" per wife  . Carotid artery occlusion   . Bursitis, shoulder     left  . Dizzy spells   . Autonomic orthostatic hypotension   . Peripheral vertigo   . Cluster headaches   . Urinary incontinence    Past Surgical History:  Past Surgical History  Procedure Laterality Date  . Tonsillectomy    . Hip arthroplasty  10/29/2011    Procedure: ARTHROPLASTY BIPOLAR HIP;  Surgeon: Shelda Pal, MD;  Location: [facility];  Service: Orthopedics;  Laterality: Right;  . Fracture surgery      both hands  . Fracture surgery      Right arm X's 2, back  . Joint replacement  11/03/11    Right hip, Fx from a fall  . Carotid endarterectomy Right   . Hernia repair Left    HPI:  78 yo male admitted to [facility] with progressive headache.  PMH + for CVAs with expressive aphasia 95 and 06, BPH, HTN, CAD.  Head CT showed previous Left CVA.  Swallow evaluation ordered due to pt h/o dysphagia.  Spouse present and admits to pt having occasional choking on food more than drinks, most notably grape nuts- especially since May of this year.  Pt with inability to walk and had increased problems swallowing (anterior labial spillage) since May of this year but spouse states no source was identified.  Pt CT showed atrophy and previous left CVA.  Spouse denies pulmonary infections recurrently.     Assessment / Plan / Recommendation Clinical Impression  Pt presents with mild oral and suspected  pharyngeal dysphagia without clinical indications of aspiration or laryngeal penetration.  Delayed oral transiting suspected due to CVA but no anterior spillage or pocketing.  Pt takes small bites/sips independently maximizing airway protection.  SLP recommends regular/thin diet to allow spouse to choose items for pt's meals - as she chops his meats at home but states he is particular re: foods.  Wife who is a nurse was present during evaluation and states pt's swallow ability is at baseline.      Aspiration Risk       Diet Recommendation Regular;Thin liquid   Liquid Administration via: Cup;Straw Medication Administration: Whole meds with liquid Supervision: Patient able to self feed (full supervision if wife not present) Compensations: Slow rate;Small sips/bites Postural Changes and/or Swallow Maneuvers: Seated upright 90 degrees;Upright 30-60 min after meal    Other  Recommendations Oral Care Recommendations: Oral care QID   Follow Up Recommendations       Frequency and Duration        Pertinent Vitals/Pain Afebrile, decreased    SLP Swallow Goals   n/a  Swallow Study Prior Functional Status     resides with wife, eats regular foods at home except chopped meats, grape nuts  General Date of Onset: 02/16/13 HPI: 78 yo male admitted to [facility] with progressive headache.  PMH + for CVAs with expressive aphasia 95 and 06, BPH, HTN, CAD.  Head CT showed previous Left CVA.  Swallow evaluation ordered due to pt h/o dysphagia.  Spouse present and admits to pt having occasional choking on food more than drinks, most notably grape nuts- especially since May of this year.  Pt with inability to walk and had increased problems swallowing (anterior labial spillage) since May of this year but spouse states no source was identified.  Pt CT showed atrophy and previous left CVA.  Spouse denies pulmonary infections recurrently.   Diet Prior to this Study: NPO Temperature Spikes Noted: No Respiratory  Status: Supplemental O2 delivered via (comment) History of Recent Intubation: No Behavior/Cognition: Alert;Requires cueing;Decreased sustained attention;Agitated;Impulsive (easily agitated) Oral Cavity - Dentition: Dentures, top;Dentures, bottom Self-Feeding Abilities: Able to feed self (with left hand) Patient Positioning: Upright in bed Baseline Vocal Quality: Clear Volitional Cough:  (? some motor planning issues) Volitional Swallow: Unable to elicit    Oral/Motor/Sensory Function Labial ROM: Reduced right Labial Symmetry: Abnormal symmetry right Labial Strength: Reduced Lingual Symmetry: Within Functional Limits Lingual Strength: Within Functional Limits Facial ROM: Reduced right Facial Strength: Reduced Velum: Within Functional Limits Mandible: Within Functional Limits   Ice Chips Ice chips: Impaired Oral Phase Impairments: Impaired anterior to posterior transit Oral Phase Functional Implications: Prolonged oral transit Pharyngeal Phase Impairments: Suspected delayed Swallow   Thin Liquid Thin Liquid: Impaired Presentation: Self Fed;Cup;Spoon;Straw Oral Phase Impairments: Impaired anterior to posterior transit Oral Phase Functional Implications: Prolonged oral transit Pharyngeal  Phase Impairments: Suspected delayed Swallow    Nectar Thick Nectar Thick Liquid: Not tested   Honey Thick Honey Thick Liquid: Not tested   Puree Puree: Impaired Presentation: Self Fed;Spoon Oral Phase Impairments: Impaired anterior to posterior transit Oral Phase Functional Implications: Prolonged oral transit Pharyngeal Phase Impairments: Suspected delayed Swallow   Solid   GO Functional Assessment Tool Used: clinical judgement Functional Limitations: Swallowing Swallow Current Status (W0981): At least 1 percent but less than 20 percent impaired, limited or restricted Swallow Goal Status [phone]): At least 1 percent but less than 20 percent impaired, limited or restricted Swallow Discharge  Status [phone]): At least 1 percent but less than 20 percent impaired, limited or restricted  Solid: Impaired Presentation: Self Fed Oral Phase Impairments: Impaired anterior to posterior transit Oral Phase Functional Implications: Other (comment) (slow transiting) Pharyngeal Phase Impairments: Suspected delayed Swallow Other Comments: slow but effective mastication       Donavan Burnet, MS [facility] SLP [phone]

## 2013-02-16 NOTE — Progress Notes (Signed)
INITIAL NUTRITION ASSESSMENT  DOCUMENTATION CODES Per approved criteria  -Not Applicable   INTERVENTION: 1. Ensure Complete po daily, each supplement provides 350 kcal and 13 grams of protein.   2. Recommend check hgbA1c, pt with slightly elevated CBG and no hx of DM.   NUTRITION DIAGNOSIS: Predicted Sub-optimal nutrient intake  related to hx of CVA as evidenced by reported weight loss.   Goal: PO intake to meet >/=90% estimated nutrition needs.   Monitor:  PO intake, weight trends, labs  Reason for Assessment: Malnutrition Screening Tool  78 y.o. male  Admitting Dx: Accelerated hypertension  ASSESSMENT: Pt with headache presented to ED and found with HTN. Pt recently had a stroke and was at [facility] for rehab.   Wife reports pt had lost weight, down to about 170 lbs. No weight on file for this admission, RD weighed pt in bed and weight was 190 lbs, consistent with previous weights. Wife reports pt is eating well.   Height: Ht Readings from Last 1 Encounters:  12/24/11 6\' 1"  (1.854 m)    Weight: Wt Readings from Last 1 Encounters:  12/24/11 190 lb (86.183 kg)  02/16/2013 190 lbs   Ideal Body Weight: 184 lbs  % Ideal Body Weight: 103%  Wt Readings from Last 10 Encounters:  12/24/11 190 lb (86.183 kg)  10/27/11 190 lb (86.183 kg)  10/27/11 190 lb (86.183 kg)    Usual Body Weight: 190 lbs   % Usual Body Weight: 100%  BMI:  25 kg/(m^2) WNL  Estimated Nutritional Needs: Kcal: 1950-2150 Protein: 86-96 gm  Fluid: 2-2.2 L   Skin: intact   Diet Order: General  EDUCATION NEEDS: -No education needs identified at this time  No intake or output data in the 24 hours ending 02/16/13 1145  Last BM: PTA    Labs:   Recent Labs Lab 02/15/13 1930 02/16/13 0628  NA 139 139  K 3.6 3.6  CL 102 103  CO2 25 26  BUN 12 11  CREATININE 0.82 0.82  CALCIUM 9.4 9.3  GLUCOSE 138* 123*    CBG (last 3)   Recent Labs  02/16/13 0650  GLUCAP 134*    No results  found for this basename: HGBA1C    Scheduled Meds: . amLODipine  5 mg Oral Daily  . clopidogrel  75 mg Oral Daily  . dutasteride  0.5 mg Oral q morning - 10a  . nitroGLYCERIN  0.5 inch Topical Q6H  . sodium chloride  3 mL Intravenous Q12H    Continuous Infusions:   Past Medical History  Diagnosis Date  . Coronary artery disease   . Angina   . Hypertension   . Stroke 1995, 2006    R side weak, slurred speech, exp. asphasia  . Anemia     when had stomach ulcer  . Headache(784.0)   . Arthritis   . Abdominal aneurysm   . Disorder of heart muscle     "thickened heart muscle" per wife  . Carotid artery occlusion   . Bursitis, shoulder     left  . Dizzy spells   . Autonomic orthostatic hypotension   . Peripheral vertigo   . Cluster headaches   . Urinary incontinence     Past Surgical History  Procedure Laterality Date  . Tonsillectomy    . Hip arthroplasty  10/29/2011    Procedure: ARTHROPLASTY BIPOLAR HIP;  Surgeon: Shelda Pal, MD;  Location: [facility];  Service: Orthopedics;  Laterality: Right;  . Fracture surgery  both hands  . Fracture surgery      Right arm X's 2, back  . Joint replacement  11/03/11    Right hip, Fx from a fall  . Carotid endarterectomy Right   . Hernia repair Left     Clarene Duke RD, LDN Pager [phone] After Hours pager [phone]
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
## 2013-02-16 NOTE — Consult Note (Addendum)
NEURO HOSPITALIST CONSULT NOTE    Reason for Consult: BP management in setting of previous carotid stenosis.   HPI:                                                                                                                                          [name] [name] is an 78 y.o. male  With history of stroke with aphasia and right hemi-paresis in 11/01/1993. He has a history of left internal carotid artery occlusion and is status post right internal carotid endarterectomy in November 2005 by Dr. Jerilee Field. Dopplers by Dr. Hart Rochester who also follows an abdominal aortic aneurysm which is stable at 4.8 cm. In 12/2011. Patient was brought to [place] due to increased HA. On arrival patients BP was ranging between 190-214 systolic.  Since admission this has slowly been tapered down to most recent BP 135/79. In the past he has had reactions to BP medications and told not to take antihypertensives, however, most recent BP were significantly elevated.    Past Medical History  Diagnosis Date  . Coronary artery disease   . Angina   . Hypertension   . Stroke 1995, 2006    R side weak, slurred speech, exp. asphasia  . Anemia     when had stomach ulcer  . Headache(784.0)   . Arthritis   . Abdominal aneurysm   . Disorder of heart muscle     "thickened heart muscle" per wife  . Carotid artery occlusion   . Bursitis, shoulder     left  . Dizzy spells   . Autonomic orthostatic hypotension   . Peripheral vertigo   . Cluster headaches   . Urinary incontinence     Past Surgical History  Procedure Laterality Date  . Tonsillectomy    . Hip arthroplasty  10/29/2011    Procedure: ARTHROPLASTY BIPOLAR HIP;  Surgeon: Shelda Pal, MD;  Location: [facility];  Service: Orthopedics;  Laterality: Right;  . Fracture surgery      both hands  . Fracture surgery      Right arm X's 2, back  . Joint replacement  11/03/11    Right hip, Fx from a fall  . Carotid endarterectomy Right    . Hernia repair Left     Family History  Problem Relation Age of Onset  . Hypertension Mother   . Hypertension Father       Social History:  reports that he quit smoking about 9 years ago. His smoking use included Cigarettes. He smoked 0.00 packs per day. He has never used smokeless tobacco. He reports that he does not drink alcohol or use illicit drugs.  Allergies  Allergen Reactions  . Other Hives and Itching    ALL B.P. MED'S-TOLD TO NOT TAKE  BY MD.  Had a reaction believed to be hives and itching, but not sure if any other reactions were involved.      MEDICATIONS:                                                                                                                     Prior to Admission:  Prescriptions prior to admission  Medication Sig Dispense Refill  . Calcium Carbonate-Vit D-Min (CALCIUM 600 + MINERALS PO) Take 1 tablet by mouth 2 (two) times daily.       Marland Kitchen dipyridamole-aspirin (AGGRENOX) 200-25 MG per 12 hr capsule Take 1 capsule by mouth 2 (two) times daily.      Marland Kitchen dutasteride (AVODART) 0.5 MG capsule Take 0.5 mg by mouth every morning.       . methocarbamol (ROBAXIN) 500 MG tablet Take 500 mg by mouth 3 (three) times daily as needed (for muscle spasms).       . Multiple Vitamin (MULTIVITAMIN WITH MINERALS) TABS tablet Take 1 tablet by mouth daily. "Macular Protect Complete S"      . Multiple Vitamins-Minerals (MACULAR VITAMIN BENEFIT) TABS Take 1 tablet by mouth daily.      . polyethylene glycol (MIRALAX / GLYCOLAX) packet Take 17 g by mouth 2 (two) times daily as needed (for constipation).      . solifenacin (VESICARE) 5 MG tablet Take 5 mg by mouth every morning.       . traMADol (ULTRAM) 50 MG tablet Take 50 mg by mouth every 8 (eight) hours as needed for pain.      Marland Kitchen clopidogrel (PLAVIX) 75 MG tablet Take 1 tablet (75 mg total) by mouth daily.  30 tablet  12   Scheduled: . amLODipine  5 mg Oral Daily  . clopidogrel  75 mg Oral Daily  . dutasteride  0.5  mg Oral q morning - 10a  . feeding supplement  237 mL Oral Q24H  . nitroGLYCERIN  0.5 inch Topical Q6H  . sodium chloride  3 mL Intravenous Q12H     ROS:                                                                                                                                       History obtained from wife  General ROS: negative for - chills, fatigue, fever, night sweats, weight gain or weight loss Psychological ROS: negative for - behavioral disorder,  hallucinations, memory difficulties, mood swings or suicidal ideation Ophthalmic ROS: negative for - blurry vision, double vision, eye pain or loss of vision ENT ROS: negative for - epistaxis, nasal discharge, oral lesions, sore throat, tinnitus or vertigo Allergy and Immunology ROS: negative for - hives or itchy/watery eyes Hematological and Lymphatic ROS: negative for - bleeding problems, bruising or swollen lymph nodes Endocrine ROS: negative for - galactorrhea, hair pattern changes, polydipsia/polyuria or temperature intolerance Respiratory ROS: negative for - cough, hemoptysis, shortness of breath or wheezing Cardiovascular ROS: negative for - chest pain, dyspnea on exertion, edema or irregular heartbeat Gastrointestinal ROS: negative for - abdominal pain, diarrhea, hematemesis, nausea/vomiting or stool incontinence Genito-Urinary ROS: negative for - dysuria, hematuria, incontinence or urinary frequency/urgency Musculoskeletal ROS: negative for - joint swelling or muscular weakness Neurological ROS: as noted in HPI Dermatological ROS: negative for rash and skin lesion changes   Blood pressure 135/79, pulse 76, temperature 98.4 F (36.9 C), temperature source Oral, resp. rate 18, SpO2 97.00%.   Neurologic Examination:                                                                                                      Mental Status: Alert, able to follow commands and answer questions to best of his ability.  Speech dysarthric  with evidence of expressive aphasia.  Able to follow simple commands without difficulty. Cranial Nerves: II: Discs flat bilaterally; Visual fields decreased slightly on right due to macular degeneration, pupils equal, round, reactive to light and accommodation III,IV, VI: ptosis present in the right, extra-ocular motions intact bilaterally V,VII: smile asymmetric on the right, facial light touch sensation decreased on the right VIII: hearing normal bilaterally IX,X: gag reflex present XI: bilateral shoulder shrug XII: midline tongue extension Motor: Right : Upper extremity   4/5 spatic hemiparesis Left:     Upper extremity   5/5  Lower extremity   4/5     Lower extremity   5/5 --inability to dorsiflex his right foot (old) Tone and bulk:normal tone throughout; no atrophy noted Sensory: Pinprick and light touch intact throughout, bilaterally Deep Tendon Reflexes:  Right: Upper Extremity   Left: Upper extremity   biceps (C-5 to C-6) 2/4   biceps (C-5 to C-6) 2/4 tricep (C7) 2/4    triceps (C7) 2/4 Brachioradialis (C6) 2/4  Brachioradialis (C6) 2/4  Lower Extremity Lower Extremity  quadriceps (L-2 to L-4) 1/4   quadriceps (L-2 to L-4) 2/4 Achilles (S1) 0/4   Achilles (S1) 1/4  Plantars: Up going bilaterally Cerebellar: Inability to do finger-to-nose on the right,   Gait: not tested CV: pulses palpable throughout    No components found with this basename: cbc,  bmp,  coags,  chol,  tri,  ldl,  hga1c    Results for orders placed during the hospital encounter of 02/15/13 (from the past 48 hour(s))  BASIC METABOLIC PANEL     Status: Abnormal   Collection Time    02/15/13  7:30 PM      Result Value Range   Sodium 139  135 - 145 mEq/L  Potassium 3.6  3.5 - 5.1 mEq/L   Chloride 102  96 - 112 mEq/L   CO2 25  19 - 32 mEq/L   Glucose, Bld 138 (*) 70 - 99 mg/dL   BUN 12  6 - 23 mg/dL   Creatinine, Ser 1.61  0.50 - 1.35 mg/dL   Calcium 9.4  8.4 - 09.6 mg/dL   GFR calc non Af Amer  84 (*) >90 mL/min   GFR calc Af Amer >90  >90 mL/min   Comment: (NOTE)     The eGFR has been calculated using the CKD EPI equation.     This calculation has not been validated in all clinical situations.     eGFR's persistently <90 mL/min signify possible Chronic Kidney     Disease.  CBC WITH DIFFERENTIAL     Status: Abnormal   Collection Time    02/15/13  7:30 PM      Result Value Range   WBC 12.9 (*) 4.0 - 10.5 K/uL   RBC 5.40  4.22 - 5.81 MIL/uL   Hemoglobin 16.9  13.0 - 17.0 g/dL   HCT 04.5  40.9 - 81.1 %   MCV 90.0  78.0 - 100.0 fL   MCH 31.3  26.0 - 34.0 pg   MCHC 34.8  30.0 - 36.0 g/dL   RDW 91.4  78.2 - 95.6 %   Platelets 195  150 - 400 K/uL   Neutrophils Relative % 80 (*) 43 - 77 %   Neutro Abs 10.3 (*) 1.7 - 7.7 K/uL   Lymphocytes Relative 10 (*) 12 - 46 %   Lymphs Abs 1.2  0.7 - 4.0 K/uL   Monocytes Relative 8  3 - 12 %   Monocytes Absolute 1.0  0.1 - 1.0 K/uL   Eosinophils Relative 2  0 - 5 %   Eosinophils Absolute 0.2  0.0 - 0.7 K/uL   Basophils Relative 1  0 - 1 %   Basophils Absolute 0.1  0.0 - 0.1 K/uL  TROPONIN I     Status: None   Collection Time    02/15/13  7:30 PM      Result Value Range   Troponin I <0.30  <0.30 ng/mL   Comment:            Due to the release kinetics of cTnI,     a negative result within the first hours     of the onset of symptoms does not rule out     myocardial infarction with certainty.     If myocardial infarction is still suspected,     repeat the test at appropriate intervals.  URINALYSIS W MICROSCOPIC + REFLEX CULTURE     Status: Abnormal   Collection Time    02/15/13  7:44 PM      Result Value Range   Color, Urine YELLOW  YELLOW   APPearance CLEAR  CLEAR   Specific Gravity, Urine 1.016  1.005 - 1.030   pH 7.5  5.0 - 8.0   Glucose, UA NEGATIVE  NEGATIVE mg/dL   Hgb urine dipstick NEGATIVE  NEGATIVE   Bilirubin Urine NEGATIVE  NEGATIVE   Ketones, ur 15 (*) NEGATIVE mg/dL   Protein, ur 213 (*) NEGATIVE mg/dL    Urobilinogen, UA 0.2  0.0 - 1.0 mg/dL   Nitrite NEGATIVE  NEGATIVE   Leukocytes, UA NEGATIVE  NEGATIVE   WBC, UA 0-2  <3 WBC/hpf   RBC / HPF 0-2  <3 RBC/hpf   Bacteria,  UA RARE  RARE   Squamous Epithelial / LPF RARE  RARE   Urine-Other AMORPHOUS URATES/PHOSPHATES    TROPONIN I     Status: None   Collection Time    02/16/13  1:20 AM      Result Value Range   Troponin I <0.30  <0.30 ng/mL   Comment:            Due to the release kinetics of cTnI,     a negative result within the first hours     of the onset of symptoms does not rule out     myocardial infarction with certainty.     If myocardial infarction is still suspected,     repeat the test at appropriate intervals.  TROPONIN I     Status: None   Collection Time    02/16/13  6:28 AM      Result Value Range   Troponin I <0.30  <0.30 ng/mL   Comment:            Due to the release kinetics of cTnI,     a negative result within the first hours     of the onset of symptoms does not rule out     myocardial infarction with certainty.     If myocardial infarction is still suspected,     repeat the test at appropriate intervals.  BASIC METABOLIC PANEL     Status: Abnormal   Collection Time    02/16/13  6:28 AM      Result Value Range   Sodium 139  135 - 145 mEq/L   Potassium 3.6  3.5 - 5.1 mEq/L   Chloride 103  96 - 112 mEq/L   CO2 26  19 - 32 mEq/L   Glucose, Bld 123 (*) 70 - 99 mg/dL   BUN 11  6 - 23 mg/dL   Creatinine, Ser 1.61  0.50 - 1.35 mg/dL   Calcium 9.3  8.4 - 09.6 mg/dL   GFR calc non Af Amer 84 (*) >90 mL/min   GFR calc Af Amer >90  >90 mL/min   Comment: (NOTE)     The eGFR has been calculated using the CKD EPI equation.     This calculation has not been validated in all clinical situations.     eGFR's persistently <90 mL/min signify possible Chronic Kidney     Disease.  CBC     Status: Abnormal   Collection Time    02/16/13  6:28 AM      Result Value Range   WBC 13.1 (*) 4.0 - 10.5 K/uL   RBC 5.22  4.22  - 5.81 MIL/uL   Hemoglobin 16.8  13.0 - 17.0 g/dL   HCT 04.5  40.9 - 81.1 %   MCV 90.8  78.0 - 100.0 fL   MCH 32.2  26.0 - 34.0 pg   MCHC 35.4  30.0 - 36.0 g/dL   RDW 91.4  78.2 - 95.6 %   Platelets 190  150 - 400 K/uL  PROTIME-INR     Status: None   Collection Time    02/16/13  6:28 AM      Result Value Range   Prothrombin Time 13.2  11.6 - 15.2 seconds   INR 1.02  0.00 - 1.49  GLUCOSE, CAPILLARY     Status: Abnormal   Collection Time    02/16/13  6:50 AM      Result Value Range   Glucose-Capillary 134 (*) 70 -  99 mg/dL  TROPONIN I     Status: None   Collection Time    02/16/13  1:15 PM      Result Value Range   Troponin I <0.30  <0.30 ng/mL   Comment:            Due to the release kinetics of cTnI,     a negative result within the first hours     of the onset of symptoms does not rule out     myocardial infarction with certainty.     If myocardial infarction is still suspected,     repeat the test at appropriate intervals.    Dg Chest 2 View  02/15/2013   *RADIOLOGY REPORT*  Clinical Data: Headache.  Hypertension.  Altered mental status.  CHEST - 2 VIEW  Comparison: Chest x-ray 10/27/2011.  Findings: Lung volumes are low.  There are coarse interstitial markings scattered throughout the lungs bilaterally.  Some bibasilar opacities are also noted, favored to reflect areas of subsegmental atelectasis.  No definite consolidative airspace disease.  No pleural effusions.  No evidence of pulmonary edema. Heart size is normal. The patient is rotated to the left on today's exam, resulting in distortion of the mediastinal contours and reduced diagnostic sensitivity and specificity for mediastinal pathology.  Atherosclerosis in the thoracic aorta.  Probable old healed fracture of the right proximal humerus incidentally noted.  IMPRESSION: 1.  Low lung volumes without definite radiographic evidence of acute cardiopulmonary disease. 2.  Patchy coarse interstitial markings throughout the lungs  bilaterally, favored to reflect areas of chronic scarring. Underlying interstitial lung disease is not excluded, and attention on follow-up studies is recommended. 3.  Atherosclerosis.   Original Report Authenticated By: Trudie Reed, M.D.   Ct Head Wo Contrast  02/15/2013   *RADIOLOGY REPORT*  Clinical Data: Headache with hypertension; aphasia  CT HEAD WITHOUT CONTRAST  Technique:  Contiguous axial images were obtained from the base of the skull through the vertex without contrast. Study was obtained within 24 hours of patient arrival at the emergency department.  Comparison:  Oct 27, 2011  Findings:  There is mild diffuse atrophy.  There is no mass, hemorrhage, extra-axial fluid collection, or midline shift.  There is evidence of extensive small vessel disease throughout the centra semiovale bilaterally, stable.  There is evidence of a prior infarct involving portions of the posterior left frontal and much of the left parietal lobes.  This infarct is stable.  There is no new gray-white compartment lesions.  No acute infarct. Bony calvarium appears intact.  Mastoid air cells are clear.  IMPRESSION:  Atrophy with extensive small vessel disease and sizeable prior infarct in the left.  There is no mass, hemorrhage, or acute appearing infarct.   Original Report Authenticated By: Bretta Bang, M.D.   Assessment and plan discussed with with attending physician and they are in agreement.    Felicie Morn PA-C [facility] [phone]  02/16/2013, 2:18 PM  I have seen the patient and reviewed the above note, and agree with findings. Expressive aphasia and right hemiparesis.   I spoke with his wife who confirms that he has no new deficits, but that everything that we're seeing is similar to previous.  Assessment/Plan:  78 year old male admitted for hypertensive crisis, he now appears to be better controlled. He was told in the past to avoid antihypertensives, but it is unclear to me the exact  reason for this. He does have a carotid occlusion, but appears to perfuse fairly  well in the 150s which is where he was when I saw him. I think that aggressive blood pressure control may be problematic in this gentleman, but certainly I think that with his presentation for hypertensive emergency blood pressure management would be needed in the future.  Without any evidence of new ischemic symptoms, I do not think that any further imaging needs to be done at this time.   Management of his pressure may be a matter of trial and error finding a balance between control and symptomatic orthostasis.   1) will defer management of his blood pressure to the internal medicine team, but I would favor relatively nonaggressive management as he may become symptomatic. 2) please call if any further questions remain.  Ritta Slot, MD [facility] [phone]  If 7pm- 7am, please page neurology on call at [phone].

## 2013-02-17 MED ORDER — BUSPIRONE HCL 7.5 MG PO TABS: 7.5000 mg | ORAL_TABLET | Freq: Two times a day (BID) | ORAL | Status: DC

## 2013-02-17 MED ORDER — LORAZEPAM 2 MG/ML IJ SOLN: 0.5000 mg | INTRAMUSCULAR | Status: DC | PRN

## 2013-02-17 MED ORDER — ACETAMINOPHEN 325 MG PO TABS: 650.0000 mg | ORAL_TABLET | Freq: Four times a day (QID) | ORAL | Status: DC | PRN

## 2013-02-17 MED ORDER — AMLODIPINE BESYLATE 5 MG PO TABS: 5.0000 mg | ORAL_TABLET | Freq: Every day | ORAL | Status: DC

## 2013-02-17 NOTE — Progress Notes (Signed)
Talked to patient with spouse present about DCP; patient goes to Outpatient rehab 2 times a week for years and wants to continue going to OP rehab; PCP is Dr Arnell Asal in [facility] [place]. Patient and spouse travels to [place] every 2 weeks; Patient also is a patient with Dr Hart Rochester - for abdominal aneurysm; Patient has a wheel chair, lift chair, shower chair, scooter. Pharmacy of choice is [facility] and does not have any problem getting his medication; no needs identified; Abelino Derrick RN,BSN,MHA [phone]

## 2013-02-17 NOTE — Discharge Summary (Signed)
Physician Discharge Summary  [name] [name] ZOX:[id number] DOB: 10-22-1934 DOA: 02/15/2013  PCP: Evie Lacks, MD  Admit date: 02/15/2013 Discharge date: 02/17/2013  Time spent: > 25 minuites  minutes  Recommendations for Outpatient Follow-up:  1. F/u with PCP in 1 week   Discharge Diagnoses:  Principal Problem:   Accelerated hypertension Active Problems:   HLD (hyperlipidemia)   History of CVA (cerebrovascular accident)   Abdominal aneurysm without mention of rupture   Cluster headache   Discharge Condition: stable   Diet recommendation: heat healthy   Filed Weights   02/17/13 0930  Weight: 82.283 kg (181 lb 6.4 oz)    History of present illness:  78 yr old ?, known h/o multiple CVA L side 1995,[sntinel event]c R hemiparesis/aphasia, history of ICA occlusion/RICa endarterectomy 04/2004, known triple A, right femoral neck fracture status post repair 10/27/2011, last carotid duplex 12/24/2011 negative for stenosis,, known right humeral fracture repair 11/03/2010, kno wn history of dizziness and orthostasis previously followed by neurology, known nephrolithiasis admitted to [facility] 02/15/13 with worsening headache admitted with HTN urgency   Hospital Course:  1. Headache probably 2/2 to Accelerated Htn-added Nitro paste 0.5 inches. Unlikely this is new onset, per wife patient had episodes at home and takes tramadol;  -cont supportive care, tylenol, tramadol prn  -clinically improved; no further studies per neurology  2. Hypertensive urgency-; patient was no taking any meds at home;  -BP improved on Amlodipine; cont outpatient titration  3. Leukocytosis-Unclear etiology. No fever. Monitor.   4. H/o Multiple vascular events including 2 CVa's, Carotid atherosclerosis s/p intervention-continue Plavix 75 daily  Procedures: CT  Consultations:  Neurology   Discharge Exam: Filed Vitals:   02/17/13 1415  BP: 129/83  Pulse: 80  Temp: 97.8 F (36.6 C)  Resp: 17     General: alert, oriented  Cardiovascular: S1, S2, RRR  Respiratory: CTA BL   Discharge Instructions  Discharge Orders   Future Appointments Provider Department Dept Phone   02/22/2013 2:00 PM Kerry Fort, PT [facility] [phone]   02/23/2013 2:00 PM Kerry Fort, PT [facility] [phone]   03/01/2013 2:00 PM Kerry Fort, PT [facility] [phone]   03/02/2013 2:00 PM Kerry Fort, PT [facility] [phone]   03/08/2013 2:00 PM Kerry Fort, PT [facility] [phone]   03/11/2013 2:00 PM Kerry Fort, PT [facility] [phone]   12/28/2013 9:30 AM [facility] [phone]   Eat a light meal the night before the exam but please avoid gaseous foods.   Nothing to eat or drink for at least 8 hours prior to the exam. No gum chewing or smoking the morning of the exam. Please take your morning medications with small sips of water, especially blood pressure medication. If you have several vascular lab exams and will see physician, please bring a snack with you.   12/28/2013 10:00 AM [facility] [phone]   12/28/2013 11:30 AM Pryor Ochoa, MD [facility] [phone]   01/03/2014 2:30 PM Levert Feinstein, MD [facility] [phone]   Future Orders Complete By Expires   Diet - low sodium heart healthy  As directed    Discharge instructions  As directed    Comments:     Please follow up with primary care doctor in 1 week   Increase activity slowly  As directed  Medication List         acetaminophen 325 MG tablet  Commonly known as:  TYLENOL  Take 2 tablets (650 mg total) by  mouth every 6 (six) hours as needed.     amLODipine 5 MG tablet  Commonly known as:  NORVASC  Take 1 tablet (5 mg total) by mouth daily.     busPIRone 7.5 MG tablet  Commonly known as:  BUSPAR  Take 1 tablet (7.5 mg total) by mouth 2 (two) times daily.     CALCIUM 600 + MINERALS PO  Take 1 tablet by mouth 2 (two) times daily.     clopidogrel 75 MG tablet  Commonly known as:  PLAVIX  Take 1 tablet (75 mg total) by mouth daily.     dipyridamole-aspirin 200-25 MG per 12 hr capsule  Commonly known as:  AGGRENOX  Take 1 capsule by mouth 2 (two) times daily.     dutasteride 0.5 MG capsule  Commonly known as:  AVODART  Take 0.5 mg by mouth every morning.     MACULAR VITAMIN BENEFIT Tabs  Take 1 tablet by mouth daily.     methocarbamol 500 MG tablet  Commonly known as:  ROBAXIN  Take 500 mg by mouth 3 (three) times daily as needed (for muscle spasms).     multivitamin with minerals Tabs tablet  Take 1 tablet by mouth daily. "Macular Protect Complete S"     polyethylene glycol packet  Commonly known as:  MIRALAX / GLYCOLAX  Take 17 g by mouth 2 (two) times daily as needed (for constipation).     solifenacin 5 MG tablet  Commonly known as:  VESICARE  Take 5 mg by mouth every morning.     traMADol 50 MG tablet  Commonly known as:  ULTRAM  Take 50 mg by mouth every 8 (eight) hours as needed for pain.       Allergies  Allergen Reactions  . Other Hives and Itching    ALL B.P. MED'S-TOLD TO NOT TAKE BY MD.  Had a reaction believed to be hives and itching, but not sure if any other reactions were involved.         Follow-up Information   Follow up with Evie Lacks, MD In 1 week.   Specialty:  Neurology   Contact information:   [address] [place] [address] [phone]        The results of significant diagnostics from this hospitalization (including imaging, microbiology, ancillary and laboratory) are listed below for reference.    Significant  Diagnostic Studies: Dg Chest 2 View  02/15/2013   *RADIOLOGY REPORT*  Clinical Data: Headache.  Hypertension.  Altered mental status.  CHEST - 2 VIEW  Comparison: Chest x-ray 10/27/2011.  Findings: Lung volumes are low.  There are coarse interstitial markings scattered throughout the lungs bilaterally.  Some bibasilar opacities are also noted, favored to reflect areas of subsegmental atelectasis.  No definite consolidative airspace disease.  No pleural effusions.  No evidence of pulmonary edema. Heart size is normal. The patient is rotated to the left on today's exam, resulting in distortion of the mediastinal contours and reduced diagnostic sensitivity and specificity for mediastinal pathology.  Atherosclerosis in the thoracic aorta.  Probable old healed fracture of the right proximal humerus incidentally noted.  IMPRESSION: 1.  Low lung volumes without definite radiographic evidence of acute cardiopulmonary disease. 2.  Patchy coarse interstitial markings throughout the lungs bilaterally, favored to reflect areas of chronic scarring. Underlying  interstitial lung disease is not excluded, and attention on follow-up studies is recommended. 3.  Atherosclerosis.   Original Report Authenticated By: Trudie Reed, M.D.   Ct Head Wo Contrast  02/15/2013   *RADIOLOGY REPORT*  Clinical Data: Headache with hypertension; aphasia  CT HEAD WITHOUT CONTRAST  Technique:  Contiguous axial images were obtained from the base of the skull through the vertex without contrast. Study was obtained within 24 hours of patient arrival at the emergency department.  Comparison:  Oct 27, 2011  Findings:  There is mild diffuse atrophy.  There is no mass, hemorrhage, extra-axial fluid collection, or midline shift.  There is evidence of extensive small vessel disease throughout the centra semiovale bilaterally, stable.  There is evidence of a prior infarct involving portions of the posterior left frontal and much of the left parietal lobes.   This infarct is stable.  There is no new gray-white compartment lesions.  No acute infarct. Bony calvarium appears intact.  Mastoid air cells are clear.  IMPRESSION:  Atrophy with extensive small vessel disease and sizeable prior infarct in the left.  There is no mass, hemorrhage, or acute appearing infarct.   Original Report Authenticated By: Bretta Bang, M.D.    Microbiology: No results found for this or any previous visit (from the past 240 hour(s)).   Labs: Basic Metabolic Panel:  Recent Labs Lab 02/15/13 1930 02/16/13 0628  NA 139 139  K 3.6 3.6  CL 102 103  CO2 25 26  GLUCOSE 138* 123*  BUN 12 11  CREATININE 0.82 0.82  CALCIUM 9.4 9.3   Liver Function Tests: No results found for this basename: AST, ALT, ALKPHOS, BILITOT, PROT, ALBUMIN,  in the last 168 hours No results found for this basename: LIPASE, AMYLASE,  in the last 168 hours No results found for this basename: AMMONIA,  in the last 168 hours CBC:  Recent Labs Lab 02/15/13 1930 02/16/13 0628  WBC 12.9* 13.1*  NEUTROABS 10.3*  --   HGB 16.9 16.8  HCT 48.6 47.4  MCV 90.0 90.8  PLT 195 190   Cardiac Enzymes:  Recent Labs Lab 02/15/13 1930 02/16/13 0120 02/16/13 0628 02/16/13 1315  TROPONINI <0.30 <0.30 <0.30 <0.30   BNP: BNP (last 3 results) No results found for this basename: PROBNP,  in the last 8760 hours CBG:  Recent Labs Lab 02/16/13 0650  GLUCAP 134*       Signed:  Jonette Mate N  [facility] 02/17/2013, 3:38 PM

## 2013-02-19 ENCOUNTER — Telehealth: Payer: Self-pay | Admitting: Neurology

## 2013-02-19 DIAGNOSIS — I10 Essential (primary) hypertension: Secondary | ICD-10-CM

## 2013-02-19 MED ORDER — AMLODIPINE BESYLATE 5 MG PO TABS: 5.0000 mg | ORAL_TABLET | Freq: Every day | ORAL | Status: DC

## 2013-02-19 NOTE — Telephone Encounter (Signed)
Spoke to wife, patient was discharged from hospital on 02-17-13 and told to see his neurologist within one week.  The wife said the patient was having bad cluster headaches and his BP was 220/110, so they went to the ER.  He was admitted and spent 2 days in hospital.  The wife said the patient's PCP is in [place] and will try to see him soon.  She is concerned because his BP is still running about 180-190/90.  Do we need to get him in next week?  Please advise.

## 2013-02-19 NOTE — Telephone Encounter (Signed)
I have called [name] [name], 220/110, patient was taken hospital he in September 8th, 2014 for elevated blood pressure, his wife reported today's blood pressure is 220 over 110, he stayed in [place] for 2 weeks, and then [place] for 2 weeks, his primary care physician is at [facility], at baseline is not taking any blood pressure medications,  He is not taking Norvasc 5 mg every day as suggested by his ED visit, I have called in the prescription, advised his wife to check his blood pressure twice a day,

## 2013-02-20 ENCOUNTER — Encounter (HOSPITAL_COMMUNITY): Payer: Self-pay | Admitting: *Deleted

## 2013-02-20 ENCOUNTER — Emergency Department (HOSPITAL_COMMUNITY)
Admission: EM | Admit: 2013-02-20 | Discharge: 2013-02-20 | Disposition: A | Discharge status: Home / Self Care | Payer: Medicare Other | Attending: Emergency Medicine | Admitting: Emergency Medicine

## 2013-02-20 DIAGNOSIS — Z7902 Long term (current) use of antithrombotics/antiplatelets: Secondary | ICD-10-CM | POA: Insufficient documentation

## 2013-02-20 DIAGNOSIS — Z8669 Personal history of other diseases of the nervous system and sense organs: Secondary | ICD-10-CM | POA: Insufficient documentation

## 2013-02-20 DIAGNOSIS — R519 Headache, unspecified: Secondary | ICD-10-CM

## 2013-02-20 DIAGNOSIS — Z8673 Personal history of transient ischemic attack (TIA), and cerebral infarction without residual deficits: Secondary | ICD-10-CM | POA: Insufficient documentation

## 2013-02-20 DIAGNOSIS — M129 Arthropathy, unspecified: Secondary | ICD-10-CM | POA: Insufficient documentation

## 2013-02-20 DIAGNOSIS — N390 Urinary tract infection, site not specified: Secondary | ICD-10-CM

## 2013-02-20 DIAGNOSIS — I2510 Atherosclerotic heart disease of native coronary artery without angina pectoris: Secondary | ICD-10-CM | POA: Insufficient documentation

## 2013-02-20 DIAGNOSIS — Z862 Personal history of diseases of the blood and blood-forming organs and certain disorders involving the immune mechanism: Secondary | ICD-10-CM | POA: Insufficient documentation

## 2013-02-20 DIAGNOSIS — R51 Headache: Secondary | ICD-10-CM | POA: Insufficient documentation

## 2013-02-20 DIAGNOSIS — Z87891 Personal history of nicotine dependence: Secondary | ICD-10-CM | POA: Insufficient documentation

## 2013-02-20 DIAGNOSIS — I10 Essential (primary) hypertension: Secondary | ICD-10-CM

## 2013-02-20 DIAGNOSIS — Z79899 Other long term (current) drug therapy: Secondary | ICD-10-CM | POA: Insufficient documentation

## 2013-02-20 LAB — URINE MICROSCOPIC-ADD ON

## 2013-02-20 LAB — URINALYSIS, ROUTINE W REFLEX MICROSCOPIC
Bilirubin Urine: NEGATIVE
Glucose, UA: NEGATIVE mg/dL
Ketones, ur: NEGATIVE mg/dL
Nitrite: NEGATIVE
Protein, ur: NEGATIVE mg/dL
Specific Gravity, Urine: 1.013 (ref 1.005–1.030)
Urobilinogen, UA: 0.2 mg/dL (ref 0.0–1.0)
pH: 7.5 (ref 5.0–8.0)

## 2013-02-20 MED ORDER — HYDRALAZINE HCL 20 MG/ML IJ SOLN
5.0000 mg | Freq: Once | INTRAMUSCULAR | Status: AC
  Administered 2013-02-20: 14:00:00 via INTRAVENOUS
  Filled 2013-02-20: qty 1

## 2013-02-20 MED ORDER — SODIUM CHLORIDE 0.9 % IV BOLUS (SEPSIS)
1000.0000 mL | Freq: Once | INTRAVENOUS | Status: AC
  Administered 2013-02-20: 1000 mL via INTRAVENOUS

## 2013-02-20 MED ORDER — DEXTROSE 5 % IV SOLN
1.0000 g | INTRAVENOUS | Status: DC
  Administered 2013-02-20: 1 g via INTRAVENOUS
  Filled 2013-02-20: qty 10

## 2013-02-20 MED ORDER — HYDRALAZINE HCL 20 MG/ML IJ SOLN
5.0000 mg | Freq: Once | INTRAMUSCULAR | Status: AC
  Administered 2013-02-20: 5 mg via INTRAVENOUS

## 2013-02-20 MED ORDER — DIPHENHYDRAMINE HCL 50 MG/ML IJ SOLN
25.0000 mg | Freq: Once | INTRAMUSCULAR | Status: AC
  Administered 2013-02-20: 25 mg via INTRAVENOUS
  Filled 2013-02-20: qty 1

## 2013-02-20 MED ORDER — METOCLOPRAMIDE HCL 5 MG/ML IJ SOLN
10.0000 mg | Freq: Once | INTRAMUSCULAR | Status: AC
  Administered 2013-02-20: 10 mg via INTRAVENOUS
  Filled 2013-02-20: qty 2

## 2013-02-20 MED ORDER — CEPHALEXIN 500 MG PO CAPS: 500.0000 mg | ORAL_CAPSULE | Freq: Four times a day (QID) | ORAL | Status: DC

## 2013-02-20 MED ORDER — KETOROLAC TROMETHAMINE 15 MG/ML IJ SOLN
15.0000 mg | Freq: Once | INTRAMUSCULAR | Status: AC
  Administered 2013-02-20: 15 mg via INTRAVENOUS
  Filled 2013-02-20: qty 1

## 2013-02-20 NOTE — ED Notes (Signed)
IV antibiotics infusing.

## 2013-02-20 NOTE — ED Notes (Signed)
Patient and wife given meal bags and drinks.

## 2013-02-20 NOTE — ED Notes (Signed)
Pt discharged Wed Dx with cluster h/a & hypertension, started on norvasc & buspar. Wife reports just gave first dose of both meds this morning prior to calling EMS.  Stated pt c/o h/a yesterday too.

## 2013-02-20 NOTE — ED Notes (Addendum)
C/o h/a, dizziness & high BP this morning. Discharged from hospital Wed for same. Just started on BP meds upon discharge.

## 2013-02-21 NOTE — ED Provider Notes (Signed)
CSN: [id number]     Arrival date & time 02/20/13  1040 History   First MD Initiated Contact with Patient 02/20/13 1209     Chief Complaint  Patient presents with  . Headache  . Hypertension   (Consider location/radiation/quality/duration/timing/severity/associated sxs/prior Treatment) HPI  78 year old male presenting with wife for evaluation of headache. Patient has a history of prior stroke leaving him with a fascia. He does endorse a headache. Wife states that he appears uncomfortable. He otherwise is his baseline neurologic status. No trauma. Patient with recent evaluation admission for headache. He was prescribed amlodipine. He did not start taking this until his first illness this morning. No fevers or chills. No nausea or vomiting.  Past Medical History  Diagnosis Date  . Coronary artery disease   . Angina   . Hypertension   . Stroke 1995, 2006    R side weak, slurred speech, exp. asphasia  . Anemia     when had stomach ulcer  . Headache(784.0)   . Arthritis   . Abdominal aneurysm   . Disorder of heart muscle     "thickened heart muscle" per wife  . Carotid artery occlusion   . Bursitis, shoulder     left  . Dizzy spells   . Autonomic orthostatic hypotension   . Peripheral vertigo   . Cluster headaches   . Urinary incontinence    Past Surgical History  Procedure Laterality Date  . Tonsillectomy    . Hip arthroplasty  10/29/2011    Procedure: ARTHROPLASTY BIPOLAR HIP;  Surgeon: Shelda Pal, MD;  Location: [facility];  Service: Orthopedics;  Laterality: Right;  . Fracture surgery      both hands  . Fracture surgery      Right arm X's 2, back  . Joint replacement  11/03/11    Right hip, Fx from a fall  . Carotid endarterectomy Right   . Hernia repair Left    Family History  Problem Relation Age of Onset  . Hypertension Mother   . Hypertension Father    History  Substance Use Topics  . Smoking status: Former Smoker    Types: Cigarettes    Quit date:  06/30/2003  . Smokeless tobacco: Never Used  . Alcohol Use: No    Review of Systems  All systems reviewed and negative, other than as noted in HPI.   Allergies  Other  Home Medications   Current Outpatient Rx  Name  Route  Sig  Dispense  Refill  . amLODipine (NORVASC) 5 MG tablet   Oral   Take 1 tablet (5 mg total) by mouth daily.   30 tablet   12   . busPIRone (BUSPAR) 7.5 MG tablet   Oral   Take 1 tablet (7.5 mg total) by mouth 2 (two) times daily.   30 tablet   0   . Calcium Carbonate-Vit D-Min (CALCIUM 600 + MINERALS PO)   Oral   Take 1 tablet by mouth 2 (two) times daily.          Marland Kitchen dipyridamole-aspirin (AGGRENOX) 200-25 MG per 12 hr capsule   Oral   Take 1 capsule by mouth 2 (two) times daily.         Marland Kitchen dutasteride (AVODART) 0.5 MG capsule   Oral   Take 0.5 mg by mouth every morning.          Marland Kitchen HYDROcodone-acetaminophen (NORCO) 10-325 MG per tablet   Oral   Take 1 tablet by mouth every 6 (six)  hours as needed for pain.         . Multiple Vitamin (MULTIVITAMIN WITH MINERALS) TABS tablet   Oral   Take 1 tablet by mouth daily. "Macular Protect Complete S"         . Multiple Vitamins-Minerals (MACULAR VITAMIN BENEFIT) TABS   Oral   Take 1 tablet by mouth daily.         . polyethylene glycol (MIRALAX / GLYCOLAX) packet   Oral   Take 17 g by mouth 2 (two) times daily as needed (for constipation).         . solifenacin (VESICARE) 5 MG tablet   Oral   Take 5 mg by mouth every morning.          . tamsulosin (FLOMAX) 0.4 MG CAPS capsule   Oral   Take 0.4 mg by mouth.         . traMADol (ULTRAM) 50 MG tablet   Oral   Take 50 mg by mouth every 8 (eight) hours as needed for pain.         Marland Kitchen acetaminophen (TYLENOL) 325 MG tablet   Oral   Take 2 tablets (650 mg total) by mouth every 6 (six) hours as needed.         . cephALEXin (KEFLEX) 500 MG capsule   Oral   Take 1 capsule (500 mg total) by mouth 4 (four) times daily.   20  capsule   0   . clopidogrel (PLAVIX) 75 MG tablet   Oral   Take 1 tablet (75 mg total) by mouth daily.   30 tablet   12    BP 149/66  Pulse 86  Temp(Src) 98.2 F (36.8 C) (Oral)  Resp 22  SpO2 99% Physical Exam  Nursing note and vitals reviewed. Constitutional: He appears well-developed and well-nourished. No distress.  HENT:  Head: Normocephalic and atraumatic.  Eyes: Conjunctivae and EOM are normal. Pupils are equal, round, and reactive to light. Right eye exhibits no discharge. Left eye exhibits no discharge.  Neck: Neck supple.  Cardiovascular: Normal rate, regular rhythm and normal heart sounds.  Exam reveals no gallop and no friction rub.   No murmur heard. Pulmonary/Chest: Effort normal and breath sounds normal. No respiratory distress.  Abdominal: Soft. He exhibits no distension. There is no tenderness.  Musculoskeletal: He exhibits no edema and no tenderness.  Neurological: He is alert.  Dysarthria. Expressive aphasia. Some spasticity R side. r side strength 4/5. R facial weakness. L sided strength 5/5.   Skin: Skin is warm and dry. He is not diaphoretic.  Psychiatric: He has a normal mood and affect. His behavior is normal. Thought content normal.    ED Course  Procedures (including critical care time) Labs Review Labs Reviewed  URINALYSIS, ROUTINE W REFLEX MICROSCOPIC - Abnormal; Notable for the following:    APPearance CLOUDY (*)    Hgb urine dipstick MODERATE (*)    Leukocytes, UA LARGE (*)    All other components within normal limits  URINE MICROSCOPIC-ADD ON   Imaging Review No results found.  MDM   1. Headache   2. UTI (lower urinary tract infection)   3. Hypertension    78yM with HA. May be related to HTN. With past hx though, hesitant to be too aggressive with BP reduction. HA much improved prior to DC. Neuro exam remains at reported baseline. Would not change home meds since just started taking amlodipine today. Keflex for possible UTI.  Raeford Razor, MD 02/21/13 1556

## 2013-02-22 ENCOUNTER — Ambulatory Visit: Payer: Medicare Other | Admitting: Physical Therapy

## 2013-02-23 ENCOUNTER — Telehealth: Payer: Self-pay | Admitting: Neurology

## 2013-02-23 ENCOUNTER — Ambulatory Visit: Payer: Medicare Other | Admitting: Physical Therapy

## 2013-02-23 NOTE — Telephone Encounter (Signed)
I have talked with my office coordinator, the earliest appointment for [facility] is February 2015, [place] appt in Dec 2014  Lupita Leash please call patient, his wife should continue to monitor  his blood pressure at least twice a day, if it is remaining elevated, she needs to go to urgent care, ,

## 2013-02-26 NOTE — Telephone Encounter (Signed)
I spoke with wife and relayed message per Dr. Terrace Arabia.  She will continue to monitor BP and if it remaining elevated she will take him to Urgent Care.

## 2013-03-01 ENCOUNTER — Ambulatory Visit: Payer: Medicare Other | Admitting: Physical Therapy

## 2013-03-02 ENCOUNTER — Ambulatory Visit: Payer: Medicare Other | Admitting: Physical Therapy

## 2013-03-04 ENCOUNTER — Ambulatory Visit: Payer: Medicare Other | Admitting: Physical Therapy

## 2013-03-08 ENCOUNTER — Ambulatory Visit: Payer: Medicare Other | Admitting: Physical Therapy

## 2013-03-11 ENCOUNTER — Ambulatory Visit: Payer: Medicare Other | Admitting: Physical Therapy

## 2013-04-07 ENCOUNTER — Ambulatory Visit: Payer: Medicare Other | Admitting: Physical Therapy

## 2013-04-07 ENCOUNTER — Emergency Department (HOSPITAL_COMMUNITY): Payer: Medicare Other

## 2013-04-07 ENCOUNTER — Inpatient Hospital Stay (HOSPITAL_COMMUNITY): Payer: Medicare Other

## 2013-04-07 ENCOUNTER — Encounter (HOSPITAL_COMMUNITY): Payer: Self-pay | Admitting: Emergency Medicine

## 2013-04-07 ENCOUNTER — Inpatient Hospital Stay (HOSPITAL_COMMUNITY)
Admission: EM | Admit: 2013-04-07 | Discharge: 2013-04-08 | DRG: 563 | Disposition: A | Discharge status: Home / Self Care | Payer: Medicare Other | Attending: Family Medicine | Admitting: Family Medicine

## 2013-04-07 DIAGNOSIS — S42301A Unspecified fracture of shaft of humerus, right arm, initial encounter for closed fracture: Secondary | ICD-10-CM

## 2013-04-07 DIAGNOSIS — N179 Acute kidney failure, unspecified: Secondary | ICD-10-CM

## 2013-04-07 DIAGNOSIS — E785 Hyperlipidemia, unspecified: Secondary | ICD-10-CM | POA: Diagnosis present

## 2013-04-07 DIAGNOSIS — S42309A Unspecified fracture of shaft of humerus, unspecified arm, initial encounter for closed fracture: Principal | ICD-10-CM

## 2013-04-07 DIAGNOSIS — I10 Essential (primary) hypertension: Secondary | ICD-10-CM

## 2013-04-07 DIAGNOSIS — R109 Unspecified abdominal pain: Secondary | ICD-10-CM

## 2013-04-07 DIAGNOSIS — S61411A Laceration without foreign body of right hand, initial encounter: Secondary | ICD-10-CM

## 2013-04-07 LAB — POCT I-STAT, CHEM 8
Calcium, Ion: 1.15 mmol/L (ref 1.13–1.30)
Glucose, Bld: 142 mg/dL — ABNORMAL HIGH (ref 70–99)
HCT: 47 % (ref 39.0–52.0)
Hemoglobin: 16 g/dL (ref 13.0–17.0)
Potassium: 3.6 mEq/L (ref 3.5–5.1)

## 2013-04-07 LAB — URINALYSIS, ROUTINE W REFLEX MICROSCOPIC
Bilirubin Urine: NEGATIVE
Ketones, ur: NEGATIVE mg/dL
Nitrite: NEGATIVE
Specific Gravity, Urine: 1.017 (ref 1.005–1.030)
Urobilinogen, UA: 0.2 mg/dL (ref 0.0–1.0)

## 2013-04-07 LAB — CBC WITH DIFFERENTIAL/PLATELET
Basophils Absolute: 0.1 10*3/uL (ref 0.0–0.1)
Basophils Relative: 1 % (ref 0–1)
HCT: 43.1 % (ref 39.0–52.0)
MCHC: 34.3 g/dL (ref 30.0–36.0)
Monocytes Absolute: 0.7 10*3/uL (ref 0.1–1.0)
Neutro Abs: 8.8 10*3/uL — ABNORMAL HIGH (ref 1.7–7.7)
Platelets: 189 10*3/uL (ref 150–400)
RDW: 12.3 % (ref 11.5–15.5)

## 2013-04-07 LAB — URINE MICROSCOPIC-ADD ON

## 2013-04-07 MED ORDER — MORPHINE SULFATE 4 MG/ML IJ SOLN
4.0000 mg | Freq: Once | INTRAMUSCULAR | Status: AC
  Administered 2013-04-07: 4 mg via INTRAVENOUS
  Filled 2013-04-07: qty 1

## 2013-04-07 MED ORDER — OXYCODONE HCL 5 MG PO TABS
5.0000 mg | ORAL_TABLET | Freq: Four times a day (QID) | ORAL | Status: DC | PRN
  Administered 2013-04-07 – 2013-04-08 (×3): 5 mg via ORAL
  Filled 2013-04-07 (×3): qty 1

## 2013-04-07 MED ORDER — ONDANSETRON HCL 4 MG PO TABS: 4.0000 mg | ORAL_TABLET | Freq: Four times a day (QID) | ORAL | Status: DC | PRN

## 2013-04-07 MED ORDER — POTASSIUM CHLORIDE IN NACL 20-0.9 MEQ/L-% IV SOLN
INTRAVENOUS | Status: DC
  Administered 2013-04-07 – 2013-04-08 (×2): via INTRAVENOUS
  Filled 2013-04-07 (×3): qty 1000

## 2013-04-07 MED ORDER — ADULT MULTIVITAMIN W/MINERALS CH
1.0000 | ORAL_TABLET | Freq: Every day | ORAL | Status: DC
  Administered 2013-04-08: 10:00:00 1 via ORAL
  Filled 2013-04-07: qty 1

## 2013-04-07 MED ORDER — DOCUSATE SODIUM 100 MG PO CAPS
100.0000 mg | ORAL_CAPSULE | Freq: Two times a day (BID) | ORAL | Status: DC
  Administered 2013-04-07 – 2013-04-08 (×2): 100 mg via ORAL

## 2013-04-07 MED ORDER — ONDANSETRON HCL 4 MG/2ML IJ SOLN: 4.0000 mg | Freq: Four times a day (QID) | INTRAMUSCULAR | Status: DC | PRN

## 2013-04-07 MED ORDER — ACETAMINOPHEN 650 MG RE SUPP: 650.0000 mg | Freq: Four times a day (QID) | RECTAL | Status: DC | PRN

## 2013-04-07 MED ORDER — DARIFENACIN HYDROBROMIDE ER 7.5 MG PO TB24
7.5000 mg | ORAL_TABLET | Freq: Every day | ORAL | Status: DC
  Administered 2013-04-08: 10:00:00 7.5 mg via ORAL
  Filled 2013-04-07: qty 1

## 2013-04-07 MED ORDER — ONDANSETRON HCL 4 MG/2ML IJ SOLN
4.0000 mg | Freq: Once | INTRAMUSCULAR | Status: AC
  Administered 2013-04-07: 4 mg via INTRAVENOUS
  Filled 2013-04-07: qty 2

## 2013-04-07 MED ORDER — DUTASTERIDE 0.5 MG PO CAPS
0.5000 mg | ORAL_CAPSULE | Freq: Every morning | ORAL | Status: DC
  Administered 2013-04-08: 0.5 mg via ORAL
  Filled 2013-04-07: qty 1

## 2013-04-07 MED ORDER — TAMSULOSIN HCL 0.4 MG PO CAPS
0.4000 mg | ORAL_CAPSULE | Freq: Every day | ORAL | Status: DC
  Filled 2013-04-07 (×2): qty 1

## 2013-04-07 MED ORDER — HYDROCODONE-ACETAMINOPHEN 5-325 MG PO TABS
1.0000 | ORAL_TABLET | ORAL | Status: DC | PRN
  Administered 2013-04-07 – 2013-04-08 (×2): 1 via ORAL
  Filled 2013-04-07 (×2): qty 1

## 2013-04-07 MED ORDER — AMLODIPINE BESYLATE 5 MG PO TABS
5.0000 mg | ORAL_TABLET | Freq: Every day | ORAL | Status: DC
  Administered 2013-04-08: 10:00:00 5 mg via ORAL
  Filled 2013-04-07: qty 1

## 2013-04-07 MED ORDER — TETANUS-DIPHTH-ACELL PERTUSSIS 5-2.5-18.5 LF-MCG/0.5 IM SUSP
0.5000 mL | Freq: Once | INTRAMUSCULAR | Status: AC
  Administered 2013-04-07: 0.5 mL via INTRAMUSCULAR
  Filled 2013-04-07: qty 0.5

## 2013-04-07 MED ORDER — CLOPIDOGREL BISULFATE 75 MG PO TABS
75.0000 mg | ORAL_TABLET | Freq: Every day | ORAL | Status: DC
  Administered 2013-04-08: 75 mg via ORAL
  Filled 2013-04-07: qty 1

## 2013-04-07 MED ORDER — HEPARIN SODIUM (PORCINE) 5000 UNIT/ML IJ SOLN
5000.0000 [IU] | Freq: Three times a day (TID) | INTRAMUSCULAR | Status: DC
  Administered 2013-04-07 – 2013-04-08 (×2): 5000 [IU] via SUBCUTANEOUS
  Filled 2013-04-07 (×5): qty 1

## 2013-04-07 MED ORDER — ACETAMINOPHEN 325 MG PO TABS: 650.0000 mg | ORAL_TABLET | Freq: Four times a day (QID) | ORAL | Status: DC | PRN

## 2013-04-07 MED ORDER — POLYETHYLENE GLYCOL 3350 17 G PO PACK: 17.0000 g | PACK | Freq: Two times a day (BID) | ORAL | Status: DC | PRN

## 2013-04-07 NOTE — H&P (Signed)
[facility] History and Physical  [name] [name] ZOX:[id number] DOB: [birth year] DOA: 04/07/2013   PCP: Rivka Spring, MD   Chief Complaint: mechanical fall  HPI:  78 year old male with a history of hypertension, coronary artery disease, stroke with expressive aphasia, carotid artery stenosis, and urinary incontinence presents with a mechanical fall earlier on the day of admission. The patient was on his mechanical scooter and was reaching over to grab his hat when the patient took over and fell onto his right side. The patient could not get up from the floor secondary to pain. EMS was activated. There was no syncope. The patient's wife is at the bedside to supplement the history. There has not been any fevers, chills, chest discomfort, shortness of breath, nausea, vomiting, diarrhea, dysuria, hematuria, hematochezia, melena. No complaints of headache, back pain, neck pain. Patient has broken his same shoulder twice the past since diagnosed with a stroke in 1995.  In the ED, x-rays of the patient's right humerus revealed a nondisplaced greater tuberosity fracture. X-rays of the right hip revealed no bony abnormalities but showed a previous prosthesis which was intact. X-rays of the right wrist was negative for any bony abnormality. The patient was hemodynamically stable. CBC showed WBC 10.9. BMP was unremarkable except for serum creatinine of 1.10. Assessment/Plan:  Right humerus fracture -Secondary to mechanical fall -Orthopedics, Dr. Charlann Boxer, has been consulted -opioids for pain control -PT/OT evaluation -pt's wife states that she will not be able to take care of him at home due to new injuries and pain and requests SNF AKI -appears hypovolemic -Judicious IV fluids -Baseline creatinine 0.7-0.8 History of stroke with carotid artery occlusion -Continue Plavix Hypertension -Continue amlodipine Urinary incontinence -Continue Flomax and Avodart  abdominal pain -2 view abdominal  x-ray     Past Medical History  Diagnosis Date  . Coronary artery disease   . Angina   . Hypertension   . Stroke 1995, 2006    R side weak, slurred speech, exp. asphasia  . Anemia     when had stomach ulcer  . Headache(784.0)   . Arthritis   . Abdominal aneurysm   . Disorder of heart muscle     "thickened heart muscle" per wife  . Carotid artery occlusion   . Bursitis, shoulder     left  . Dizzy spells   . Autonomic orthostatic hypotension   . Peripheral vertigo   . Cluster headaches   . Urinary incontinence    Past Surgical History  Procedure Laterality Date  . Tonsillectomy    . Hip arthroplasty  10/29/2011    Procedure: ARTHROPLASTY BIPOLAR HIP;  Surgeon: Shelda Pal, MD;  Location: [facility];  Service: Orthopedics;  Laterality: Right;  . Fracture surgery      both hands  . Fracture surgery      Right arm X's 2, back  . Joint replacement  11/03/11    Right hip, Fx from a fall  . Carotid endarterectomy Right   . Hernia repair Left   . Liver biopsy      x2,    Social History:  reports that he quit smoking about 9 years ago. His smoking use included Cigarettes. He smoked 0.00 packs per day. He has never used smokeless tobacco. He reports that he does not drink alcohol or use illicit drugs.   Family History  Problem Relation Age of Onset  . Hypertension Mother   . Hypertension Father      No Active Allergies  Prior to Admission medications   Medication Sig Start Date End Date Taking? Authorizing Provider  amLODipine (NORVASC) 5 MG tablet Take 1 tablet (5 mg total) by mouth daily. 02/19/13  Yes Levert Feinstein, MD  Calcium Carbonate-Vit D-Min (CALCIUM 600 + MINERALS PO) Take 1 tablet by mouth 2 (two) times daily.    Yes Historical Provider, MD  clopidogrel (PLAVIX) 75 MG tablet Take 1 tablet (75 mg total) by mouth daily. 02/15/13  Yes Levert Feinstein, MD  dutasteride (AVODART) 0.5 MG capsule Take 0.5 mg by mouth every morning.    Yes Historical Provider, MD   HYDROcodone-acetaminophen (NORCO/VICODIN) 5-325 MG per tablet Take 1 tablet by mouth every 6 (six) hours as needed for pain.   Yes Historical Provider, MD  Multiple Vitamin (MULTIVITAMIN WITH MINERALS) TABS tablet Take 1 tablet by mouth daily. "Macular Protect Complete S"   Yes Historical Provider, MD  Multiple Vitamins-Minerals (MACULAR VITAMIN BENEFIT) TABS Take 1 tablet by mouth daily.   Yes Historical Provider, MD  polyethylene glycol (MIRALAX / GLYCOLAX) packet Take 17 g by mouth 2 (two) times daily as needed (for constipation).   Yes Historical Provider, MD  solifenacin (VESICARE) 5 MG tablet Take 5 mg by mouth every morning.    Yes Historical Provider, MD  tamsulosin (FLOMAX) 0.4 MG CAPS capsule Take 0.4 mg by mouth.   Yes Historical Provider, MD  traMADol (ULTRAM) 50 MG tablet Take 50 mg by mouth every 8 (eight) hours as needed for pain.   Yes Historical Provider, MD    Review of Systems:  Constitutional:  No weight loss, night sweats, Fevers, chills, fatigue.  Head&Eyes: No headache.  No vision loss.   ENT:  No Difficulty swallowing,Tooth/dental problems,Sore throat,   Cardio-vascular:  No chest pain, Orthopnea, PND, swelling in lower extremities,  dizziness, palpitations  GI:  No  abdominal pain, nausea, vomiting, diarrhea, loss of appetite, hematochezia, melena Resp:  No shortness of breath with exertion or at rest. No cough. No coughing up of blood .No wheezing.No chest wall deformity  Skin:  no rash or lesions.  GU:  no dysuria, change in color of urine,No flank pain.  Patient has chronic urinary frequency. Musculoskeletal:  Complains of right shoulder pain, right hand pain, right hip pain. Psych:  No change in mood or affect. No depression or anxiety. Neurologic: No headache, no dysesthesia, no focal weakness, no vision loss. No syncope  Physical Exam: Filed Vitals:   04/07/13 1304  BP: 138/79  Pulse: 80  Temp: 98.2 F (36.8 C)  Resp: 16  SpO2: 94%    General:  Awake and alert;  NAD, nontoxic, pleasant/cooperative Head/Eye: No conjunctival hemorrhage, no icterus, [place]/AT, No nystagmus ENT:  No icterus,  No thrush, edentulous, no pharyngeal exudate Neck:  No masses, no lymphadenpathy, no bruits CV:  RRR, no rub, no gallop, no S3 Lung:  Poor inspiratory effort but clear to auscultation. No wheezing. Abdomen: soft/tender to palpation in the epigastric and periumbilical region without any rebound tenderness, +BS, nondistended, no peritoneal signs Ext: No cyanosis, No rashes, No petechiae, No lymphangitis, No edema Neuro: CNII-XII intact, strength 4-/5 inlower extremities, no dysmetria;4-/5 LUE, strength not tested in the right upper extremity secondary to pain   Labs on Admission:  Basic Metabolic Panel:  Recent Labs Lab 04/07/13 1540  NA 141  K 3.6  CL 101  GLUCOSE 142*  BUN 11  CREATININE 1.10   Liver Function Tests: No results found for this basename: AST, ALT, ALKPHOS, BILITOT, PROT, ALBUMIN,  in the last 168 hours No results found for this basename: LIPASE, AMYLASE,  in the last 168 hours No results found for this basename: AMMONIA,  in the last 168 hours CBC:  Recent Labs Lab 04/07/13 1520 04/07/13 1540  WBC 10.9*  --   NEUTROABS 8.8*  --   HGB 14.8 16.0  HCT 43.1 47.0  MCV 89.8  --   PLT 189  --    Cardiac Enzymes: No results found for this basename: CKTOTAL, CKMB, CKMBINDEX, TROPONINI,  in the last 168 hours BNP: No components found with this basename: POCBNP,  CBG: No results found for this basename: GLUCAP,  in the last 168 hours  Radiological Exams on Admission: Dg Shoulder Right  04/07/2013   CLINICAL DATA:  Shoulder pain. Fall.  EXAM: RIGHT SHOULDER - 2+ VIEW  COMPARISON:  10/27/2011.  FINDINGS: The right shoulder is internally rotated at the time of imaging. Mild AC joint osteoarthritis. Type 2 acromion. There is no axillary view submitted for interpretation. Surgical clips are incidentally noted in  the neck. Healed proximal right humerus fracture appears unchanged compared to 2013 radiographs. Sclerosis in the scapular body appears unchanged compared to prior instability over 1 year likely represents a benign or indolent process.  IMPRESSION: No acute osseous abnormality. Healed proximal right humerus fracture. Faint sclerosis in the scapular body appears similar to prior exam of 2013, likely benign or indolent.   Electronically Signed   By: Andreas Newport M.D.   On: 04/07/2013 14:27   Dg Wrist 2 Views Right  04/07/2013   CLINICAL DATA:  Right wrist pain. FOLLOW UP.  EXAM: RIGHT WRIST - 2 VIEW  COMPARISON:  None.  FINDINGS: Osteopenia. No acute bony abnormality. Specifically, no fracture, subluxation, or dislocation. Soft tissues are intact. Pins/ wires noted within the distal 1st metacarpal.  IMPRESSION: No acute bony abnormality.   Electronically Signed   By: Charlett Nose M.D.   On: 04/07/2013 16:38   Dg Hip Complete Right  04/07/2013   CLINICAL DATA:  Fall, hip pain.  EXAM: RIGHT HIP - COMPLETE 2+ VIEW  COMPARISON:  10/27/2011  FINDINGS: Prior right hip replacement. No hardware or bony complicating feature. Soft tissue calcifications medial to the proximal femur, likely heterotopic bone formation. No acute fracture, subluxation or dislocation. Mild osteopenia.  IMPRESSION: Right hip replacement.  No acute bony abnormality.  Osteopenia.   Electronically Signed   By: Charlett Nose M.D.   On: 04/07/2013 16:37   Dg Humerus Right  04/07/2013   CLINICAL DATA:  Larey Seat. Injured right arm.  EXAM: RIGHT HUMERUS - 2+ VIEW  COMPARISON:  10/27/2011.  FINDINGS: Remote healed proximal humeral shaft fracture. There is a new fracture involving the greater tuberosity.  IMPRESSION: Remote healed proximal humeral shaft fracture.  New nondisplaced greater tuberosity fracture.   Electronically Signed   By: Loralie Champagne M.D.   On: 04/07/2013 14:38        Time spent:60 minutes Code Status:   DNR Family  Communication:   Wife at bedside   Carrissa Taitano, DO  [facility] Pager [phone]  If 7PM-7AM, please contact night-coverage [email or web address] Password [facility] 04/07/2013, 5:19 PM

## 2013-04-07 NOTE — Progress Notes (Signed)
   CARE MANAGEMENT ED NOTE 04/07/2013  Patient:  [name] [name], [name] [name]   Account Number:  [id number]  Date Initiated:  04/07/2013  Documentation initiated by:  Edd Arbour  Subjective/Objective Assessment:   78 yr old male medicare pt states pcp is Dr Arnell Asal in [place] c/o fall shoulder pain dx new humeral fracture     Subjective/Objective Assessment Detail:     Action/Plan:   Cm spoke with pt and his wife EPIc updated   Action/Plan Detail:   Anticipated DC Date:  04/10/2013     Status Recommendation to Physician:   Result of Recommendation:    Other ED Services  Consult Working Plan    DC Planning Services  Other  PCP issues  Outpatient Services - Pt will follow up    Choice offered to / List presented to:            Status of service:  Completed, signed off  ED Comments:   ED Comments Detail:

## 2013-04-07 NOTE — ED Provider Notes (Signed)
CSN: [id number]     Arrival date & time 04/07/13  1257 History   First MD Initiated Contact with Patient 04/07/13 1408     Chief Complaint  Patient presents with  . Shoulder Pain  . Fall   (Consider location/radiation/quality/duration/timing/severity/associated sxs/prior Treatment) HPI  78 year old male with history of stroke with right-sided residual weakness and expressive aphasia presents for evaluation of a recent fall. History obtained through wife who is at bedside as patient has expressive aphasia and can only answer "yes" or "no". While at home patient was reaching for his cap while on his scooter, lost balance and fell to the ground. He fell directly on his right arm. Fall was witnessed by wife who is in the room. He denies hitting his head or loss of consciousness. He complained of pain throughout his right arm specifically to the shoulder and elbow. Pain is 8/10 worsening with movement. No associated numbness. No complaint of headache, neck pain, back pain, chest wall pain, or any other injury. No specific treatment tried. Patient has broken his same shoulder twice the past since diagnosed with a stroke in 1995. He is currently on Plavix. He also suffered a skin tear from the fall overlying his right hand. His wife did clean the skin and apply a bandage.  Patient denies any precipitating symptoms prior to the fall.  Past Medical History  Diagnosis Date  . Coronary artery disease   . Angina   . Hypertension   . Stroke 1995, 2006    R side weak, slurred speech, exp. asphasia  . Anemia     when had stomach ulcer  . Headache(784.0)   . Arthritis   . Abdominal aneurysm   . Disorder of heart muscle     "thickened heart muscle" per wife  . Carotid artery occlusion   . Bursitis, shoulder     left  . Dizzy spells   . Autonomic orthostatic hypotension   . Peripheral vertigo   . Cluster headaches   . Urinary incontinence    Past Surgical History  Procedure Laterality Date  .  Tonsillectomy    . Hip arthroplasty  10/29/2011    Procedure: ARTHROPLASTY BIPOLAR HIP;  Surgeon: Shelda Pal, MD;  Location: [facility];  Service: Orthopedics;  Laterality: Right;  . Fracture surgery      both hands  . Fracture surgery      Right arm X's 2, back  . Joint replacement  11/03/11    Right hip, Fx from a fall  . Carotid endarterectomy Right   . Hernia repair Left    Family History  Problem Relation Age of Onset  . Hypertension Mother   . Hypertension Father    History  Substance Use Topics  . Smoking status: Former Smoker    Types: Cigarettes    Quit date: 06/30/2003  . Smokeless tobacco: Never Used  . Alcohol Use: No    Review of Systems  Constitutional: Negative for fever.  Musculoskeletal: Positive for arthralgias. Negative for neck pain.  Skin: Positive for wound.  Neurological: Negative for numbness and headaches.  Hematological: Bruises/bleeds easily.    Allergies  Review of patient's allergies indicates no active allergies.  Home Medications   Current Outpatient Rx  Name  Route  Sig  Dispense  Refill  . amLODipine (NORVASC) 5 MG tablet   Oral   Take 1 tablet (5 mg total) by mouth daily.   30 tablet   12   . Calcium Carbonate-Vit D-Min (CALCIUM  600 + MINERALS PO)   Oral   Take 1 tablet by mouth 2 (two) times daily.          . clopidogrel (PLAVIX) 75 MG tablet   Oral   Take 1 tablet (75 mg total) by mouth daily.   30 tablet   12   . dutasteride (AVODART) 0.5 MG capsule   Oral   Take 0.5 mg by mouth every morning.          Marland Kitchen HYDROcodone-acetaminophen (NORCO/VICODIN) 5-325 MG per tablet   Oral   Take 1 tablet by mouth every 6 (six) hours as needed for pain.         . Multiple Vitamin (MULTIVITAMIN WITH MINERALS) TABS tablet   Oral   Take 1 tablet by mouth daily. "Macular Protect Complete S"         . Multiple Vitamins-Minerals (MACULAR VITAMIN BENEFIT) TABS   Oral   Take 1 tablet by mouth daily.         . polyethylene  glycol (MIRALAX / GLYCOLAX) packet   Oral   Take 17 g by mouth 2 (two) times daily as needed (for constipation).         . solifenacin (VESICARE) 5 MG tablet   Oral   Take 5 mg by mouth every morning.          . tamsulosin (FLOMAX) 0.4 MG CAPS capsule   Oral   Take 0.4 mg by mouth.         . traMADol (ULTRAM) 50 MG tablet   Oral   Take 50 mg by mouth every 8 (eight) hours as needed for pain.          BP 138/79  Pulse 80  Temp(Src) 98.2 F (36.8 C)  Resp 16  SpO2 94% Physical Exam  Nursing note and vitals reviewed. Constitutional: He appears well-developed and well-nourished. No distress.  HENT:  Head: Normocephalic and atraumatic.  Eyes: Conjunctivae are normal.  Neck: Neck supple.  Pulmonary/Chest: He exhibits no tenderness.  Musculoskeletal: He exhibits tenderness (tenderness throughout R shoulder, elbow and forearm.  Decreased ROM 2/2 pain, no crepitus, no deformity.  a small skin tear noted to dorsum of R hand.  R wrist FROM, nontender).  Tenderness to lateral aspect of R hip without crepitus but decreased ROM 2/2 pain.  No deformity. Intact distal pulses.  Neurological: He is alert.  Skin: No rash noted.  Multiple bruises to skin  Psychiatric: He has a normal mood and affect.    ED Course  Procedures (including critical care time)  2:23 PM Pt with mechanical fall, injured R shoulder/R elbow from the fall.  Currently on Plavix but no active bleeding.  No headache or neck pain.  Will xray R shoulder and R elbow.  Pain medication given.     3:13 PM R humeral xray demonstrate a new nondisplaced greater tuberosity fx.  Will place in arm sling.  Pt now report having R hip pain, has partial hip replacement in the past.  Orthopedist is Dr. Charlann Boxer.  Will obtain xray of R hip.  Pain medication given.  Care discussed with attending.   3:19 PM Wife felt she cannot take care of pt at home with new humeral fracture.  Request admission for placement.  Pt not able to  ambulate.    4:14 PM I have consulted with orthopedist, Dr. Charlann Boxer who agrees with arm sling and agrees to see pt in consultation for further rehab.    4:40 PM  i have consulted [facility], Dr. Arbutus Leas who agrees to see pt in ER and will admit for further management.    Labs Review Labs Reviewed  CBC WITH DIFFERENTIAL - Abnormal; Notable for the following:    WBC 10.9 (*)    Neutrophils Relative % 81 (*)    Neutro Abs 8.8 (*)    Lymphocytes Relative 9 (*)    All other components within normal limits  POCT I-STAT, CHEM 8 - Abnormal; Notable for the following:    Glucose, Bld 142 (*)    All other components within normal limits  URINALYSIS, ROUTINE W REFLEX MICROSCOPIC   Imaging Review Dg Shoulder Right  04/07/2013   CLINICAL DATA:  Shoulder pain. Fall.  EXAM: RIGHT SHOULDER - 2+ VIEW  COMPARISON:  10/27/2011.  FINDINGS: The right shoulder is internally rotated at the time of imaging. Mild AC joint osteoarthritis. Type 2 acromion. There is no axillary view submitted for interpretation. Surgical clips are incidentally noted in the neck. Healed proximal right humerus fracture appears unchanged compared to 2013 radiographs. Sclerosis in the scapular body appears unchanged compared to prior instability over 1 year likely represents a benign or indolent process.  IMPRESSION: No acute osseous abnormality. Healed proximal right humerus fracture. Faint sclerosis in the scapular body appears similar to prior exam of 2013, likely benign or indolent.   Electronically Signed   By: Andreas Newport M.D.   On: 04/07/2013 14:27   Dg Humerus Right  04/07/2013   CLINICAL DATA:  Larey Seat. Injured right arm.  EXAM: RIGHT HUMERUS - 2+ VIEW  COMPARISON:  10/27/2011.  FINDINGS: Remote healed proximal humeral shaft fracture. There is a new fracture involving the greater tuberosity.  IMPRESSION: Remote healed proximal humeral shaft fracture.  New nondisplaced greater tuberosity fracture.   Electronically Signed   By:  Loralie Champagne M.D.   On: 04/07/2013 14:38    EKG Interpretation   None       MDM   1. Right humeral fracture, closed, initial encounter   2. Skin tear of hand without complication, right, initial encounter    BP 138/79  Pulse 80  Temp(Src) 98.2 F (36.8 C)  Resp 16  SpO2 94%  I have reviewed nursing notes and vital signs. I personally reviewed the imaging tests through PACS system  I reviewed available ER/hospitalization records thought the EMR     Fayrene Helper, [place] 04/07/13 1642

## 2013-04-07 NOTE — ED Notes (Signed)
Per EMS pt from home was moving from his scooter to couch and slide to floor landing on rt shoulder, hx of injury to that shoulder, no loc, no other complaints; pt has hx of stroke with rt side weakness and limited verbal speaking. No distress noted

## 2013-04-07 NOTE — ED Provider Notes (Signed)
  Face-to-face evaluation   History: Patient riding his scooter, leaned over and fell, striking his right shoulder. He was unable to get up afterwards because of pain in this. He has an isolated injury to the right arm, including shoulder and wrist and hand. His wife feels that this has made him more debilitated, than usual. She does feel he can be managed at home, at this time.  Physical exam: Alert, elderly man, who is aphasic and communicates with simple yes and no answers. Right shoulder is tender laterally without significant deformity. There is no tenderness on the acromion or clavicle. The right wrist is held flexed and is tender to palpation. There is a superficial skin tear on the right dorsum hand.  Medical screening examination/treatment/procedure(s) were conducted as a shared visit with non-physician practitioner(s) and myself.  I personally evaluated the patient during the encounter     Flint Melter, MD 04/09/13 [phone]

## 2013-04-07 NOTE — Progress Notes (Signed)
Utilization Review completed.  Amere Iott RN CM  

## 2013-04-07 NOTE — Progress Notes (Signed)
   CARE MANAGEMENT ED NOTE 04/07/2013  Patient:  [name] [name], [name] [name]   Account Number:  [id number]  Date Initiated:  04/07/2013  Documentation initiated by:  Edd Arbour  Subjective/Objective Assessment:   78 yr old male medicare pt states pcp is Dr Arnell Asal in [place] c/o fall shoulder pain dx new humeral fracture     Subjective/Objective Assessment Detail:   wife state they will be discharging to [facility] [facility] but have a [place] home in [facility] and the pcp is in [place] Reports previous fractures of hip and humerus with snf stay at [facility] snf Last snf stay was in 2013 with the longest stay of 100 days for his hip. Wife preference is for pt to go to [facility] [facility] after [facility] hospitalization Reports use of a PT from [place] [facility] that works for the [facility] from [place] [facility] Pt has DME at home and a ramp at the [place] home     Action/Plan:   Cm spoke with pt and his wife EPIc updated  see below note SW consult entered   Action/Plan Detail:   Anticipated DC Date:  04/10/2013     Status Recommendation to Physician:   Result of Recommendation:    Other ED Services  Consult Working Plan   In-house referral  Clinical Social Worker   DC Associate Professor  Other  PCP issues  Outpatient Services - Pt will follow up    Choice offered to / List presented to:       DME agency  [facility]       [facility] agency  [facility]    Status of service:  Completed, signed off  ED Comments:   ED Comments Detail:  04/07/13 1800 spoke with pt and spouse about medicare guidelines (observation vs inpatient admissions), home health [facility]) (length of stay in home, types of [facility] staff available, coverage, primary caregiver, up to 24 hrs before services may be started), Private duty nursing (PDN-coverage, length of stay in the home types of staff available) CM reviewed availability of HH SW to assist pcp to get pt to  [facility] (if desired disposition) from the community level. CM provided family with a list of [place] home health agencies and PDN CM discussed cost for snf if admitted from home Wife voiced understanding  Discussed pt to be further evaluated by unit therapists (PT/OT) for recommendation of level of care and share this with attending MD and unit CM

## 2013-04-08 ENCOUNTER — Encounter (HOSPITAL_COMMUNITY): Payer: Self-pay | Admitting: *Deleted

## 2013-04-08 LAB — BASIC METABOLIC PANEL
BUN: 11 mg/dL (ref 6–23)
CO2: 24 mEq/L (ref 19–32)
Calcium: 9.2 mg/dL (ref 8.4–10.5)
Chloride: 105 mEq/L (ref 96–112)
Creatinine, Ser: 0.98 mg/dL (ref 0.50–1.35)
GFR calc Af Amer: >90 mL/min (ref >90)
GFR calc non Af Amer: 78 mL/min — ABNORMAL LOW (ref >90)
Glucose, Bld: 116 mg/dL — ABNORMAL HIGH (ref 70–99)
Potassium: 3.8 mEq/L (ref 3.5–5.1)
Sodium: 138 mEq/L (ref 135–145)

## 2013-04-08 LAB — CBC
HCT: 39.9 % (ref 39.0–52.0)
Hemoglobin: 13.5 g/dL (ref 13.0–17.0)
MCH: 30.4 pg (ref 26.0–34.0)
MCHC: 33.8 g/dL (ref 30.0–36.0)
MCV: 89.9 fL (ref 78.0–100.0)
Platelets: 163 10*3/uL (ref 150–400)
RBC: 4.44 MIL/uL (ref 4.22–5.81)
RDW: 12.3 % (ref 11.5–15.5)
WBC: 11.7 10*3/uL — ABNORMAL HIGH (ref 4.0–10.5)

## 2013-04-08 MED ORDER — HYDROCODONE-ACETAMINOPHEN 5-325 MG PO TABS
1.0000 | ORAL_TABLET | ORAL | Status: DC | PRN
Start: 2013-04-08 — End: 2013-06-13

## 2013-04-08 MED ORDER — HYDROCODONE-ACETAMINOPHEN 5-325 MG PO TABS: 1.0000 | ORAL_TABLET | ORAL | Status: DC | PRN

## 2013-04-08 NOTE — Progress Notes (Signed)
PHYSICAL THERAPY NOTE Pt's wife reports that pt is being discharged today to home. No PT needs. States he is going to recliner and stay there. Declines HHPT at this time.  Blanchard Kelch PT [phone]

## 2013-04-08 NOTE — Progress Notes (Signed)
OT Cancellation Note  Patient Details Name: [name] [name] MRN: [id number] DOB: [birth year]   Cancelled Treatment:    Reason Eval/Treat Not Completed: Pain limiting ability to participate;Other (comment) (pt declined out OOB at this time. Also waiting on ortho consult  for RUE. Will recheck on pt later in day )  [name] [name], [place] 04/08/2013, 9:22 AM

## 2013-04-08 NOTE — Progress Notes (Signed)
Nutrition Brief Note  Patient identified on the Malnutrition Screening Tool (MST) Report  Wt Readings from Last 15 Encounters:  04/07/13 180 lb (81.647 kg)  02/17/13 181 lb 6.4 oz (82.283 kg)  12/24/11 190 lb (86.183 kg)  10/27/11 190 lb (86.183 kg)  10/27/11 190 lb (86.183 kg)    Body mass index is 23.75 kg/(m^2). Patient meets criteria for Normal weight based on current BMI.   Current diet order is Heart Healthy, patient is consuming approximately 100% of meals at this time. Labs and medications reviewed. Pt states he has a good appetite and was eating well PTA; pt is being discharged today.   No nutrition interventions warranted at this time. If nutrition issues arise, please consult RD.   Ian Malkin RD, LDN Inpatient Clinical Dietitian Pager: [phone] After Hours Pager: [phone]
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
## 2013-04-08 NOTE — Progress Notes (Signed)
Pt with b/p of 173/80 md called awaiting call back will continue to monitor.
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
## 2013-04-08 NOTE — Progress Notes (Signed)
md informed that pt continues to have. Pt has been given prn pain medication w/o relief. md states he will review pt's chart and assess.

## 2013-04-08 NOTE — Progress Notes (Signed)
MD called and stated no new orders for b/p at this time. Will continue to monitor.

## 2013-04-09 NOTE — Care Management Note (Signed)
    Page 1 of 1   04/09/2013     10:26:04 AM   CARE MANAGEMENT NOTE 04/09/2013  Patient:  [name] [name], [name] [name]   Account Number:  [id number]  Date Initiated:  04/08/2013  Documentation initiated by:  Colleen Can  Subjective/Objective Assessment:   dx rt humerus fracture     Action/Plan:   Plans are for patient to retrun to his home where spouse will be caregiver. There are no HHpt/ot needs or orders.   Anticipated DC Date:  04/08/2013   Anticipated DC Plan:  HOME/SELF CARE  In-house referral  Clinical Social Worker      DC Planning Services  CM consult      Choice offered to / List presented to:             Status of service:  Completed, signed off Medicare Important Message given?  NA - LOS <3 / Initial given by admissions (If response is "NO", the following Medicare IM given date fields will be blank) Date Medicare IM given:   Date Additional Medicare IM given:    Discharge Disposition:  HOME/SELF CARE  Per UR Regulation:    If discussed at Long Length of Stay Meetings, dates discussed:    Comments:

## 2013-06-13 ENCOUNTER — Encounter (HOSPITAL_COMMUNITY): Payer: Self-pay | Admitting: Emergency Medicine

## 2013-06-13 ENCOUNTER — Emergency Department (HOSPITAL_COMMUNITY): Payer: Medicare Other

## 2013-06-13 ENCOUNTER — Inpatient Hospital Stay (HOSPITAL_COMMUNITY)
Admission: EM | Admit: 2013-06-13 | Discharge: 2013-06-18 | DRG: 690 | Disposition: A | Discharge status: Skilled Nursing Facility | Payer: Medicare Other | Attending: Internal Medicine | Admitting: Internal Medicine

## 2013-06-13 DIAGNOSIS — I69998 Other sequelae following unspecified cerebrovascular disease: Secondary | ICD-10-CM | POA: Diagnosis present

## 2013-06-13 DIAGNOSIS — D72829 Elevated white blood cell count, unspecified: Secondary | ICD-10-CM

## 2013-06-13 DIAGNOSIS — N401 Enlarged prostate with lower urinary tract symptoms: Secondary | ICD-10-CM | POA: Diagnosis present

## 2013-06-13 DIAGNOSIS — I10 Essential (primary) hypertension: Secondary | ICD-10-CM

## 2013-06-13 DIAGNOSIS — R29898 Other symptoms and signs involving the musculoskeletal system: Secondary | ICD-10-CM | POA: Diagnosis present

## 2013-06-13 DIAGNOSIS — N179 Acute kidney failure, unspecified: Secondary | ICD-10-CM

## 2013-06-13 DIAGNOSIS — I714 Abdominal aortic aneurysm, without rupture, unspecified: Secondary | ICD-10-CM

## 2013-06-13 DIAGNOSIS — D649 Anemia, unspecified: Secondary | ICD-10-CM | POA: Diagnosis present

## 2013-06-13 DIAGNOSIS — Z79899 Other long term (current) drug therapy: Secondary | ICD-10-CM

## 2013-06-13 DIAGNOSIS — Z8673 Personal history of transient ischemic attack (TIA), and cerebral infarction without residual deficits: Secondary | ICD-10-CM

## 2013-06-13 DIAGNOSIS — R32 Unspecified urinary incontinence: Secondary | ICD-10-CM | POA: Diagnosis present

## 2013-06-13 DIAGNOSIS — H81399 Other peripheral vertigo, unspecified ear: Secondary | ICD-10-CM | POA: Diagnosis present

## 2013-06-13 DIAGNOSIS — N289 Disorder of kidney and ureter, unspecified: Secondary | ICD-10-CM | POA: Diagnosis present

## 2013-06-13 DIAGNOSIS — E785 Hyperlipidemia, unspecified: Secondary | ICD-10-CM

## 2013-06-13 DIAGNOSIS — E860 Dehydration: Secondary | ICD-10-CM | POA: Diagnosis present

## 2013-06-13 DIAGNOSIS — N200 Calculus of kidney: Secondary | ICD-10-CM

## 2013-06-13 DIAGNOSIS — N390 Urinary tract infection, site not specified: Principal | ICD-10-CM | POA: Diagnosis present

## 2013-06-13 DIAGNOSIS — Z48812 Encounter for surgical aftercare following surgery on the circulatory system: Secondary | ICD-10-CM

## 2013-06-13 DIAGNOSIS — M129 Arthropathy, unspecified: Secondary | ICD-10-CM | POA: Diagnosis present

## 2013-06-13 DIAGNOSIS — I69920 Aphasia following unspecified cerebrovascular disease: Secondary | ICD-10-CM | POA: Diagnosis present

## 2013-06-13 DIAGNOSIS — R339 Retention of urine, unspecified: Secondary | ICD-10-CM | POA: Diagnosis present

## 2013-06-13 DIAGNOSIS — K5900 Constipation, unspecified: Secondary | ICD-10-CM | POA: Diagnosis present

## 2013-06-13 DIAGNOSIS — R109 Unspecified abdominal pain: Secondary | ICD-10-CM

## 2013-06-13 DIAGNOSIS — R4701 Aphasia: Secondary | ICD-10-CM | POA: Diagnosis present

## 2013-06-13 DIAGNOSIS — G44009 Cluster headache syndrome, unspecified, not intractable: Secondary | ICD-10-CM | POA: Diagnosis present

## 2013-06-13 DIAGNOSIS — Z87891 Personal history of nicotine dependence: Secondary | ICD-10-CM

## 2013-06-13 DIAGNOSIS — Z96649 Presence of unspecified artificial hip joint: Secondary | ICD-10-CM

## 2013-06-13 DIAGNOSIS — I209 Angina pectoris, unspecified: Secondary | ICD-10-CM | POA: Diagnosis present

## 2013-06-13 DIAGNOSIS — N138 Other obstructive and reflux uropathy: Secondary | ICD-10-CM | POA: Diagnosis present

## 2013-06-13 HISTORY — DX: Personal history of urinary (tract) infections: Z87.440

## 2013-06-13 LAB — CBC WITH DIFFERENTIAL/PLATELET
Basophils Absolute: 0.1 10*3/uL (ref 0.0–0.1)
Basophils Relative: 0 % (ref 0–1)
Eosinophils Absolute: 0.3 10*3/uL (ref 0.0–0.7)
Eosinophils Relative: 2 % (ref 0–5)
HEMATOCRIT: 46.2 % (ref 39.0–52.0)
HEMOGLOBIN: 16.2 g/dL (ref 13.0–17.0)
LYMPHS PCT: 9 % — AB (ref 12–46)
Lymphs Abs: 1.6 10*3/uL (ref 0.7–4.0)
MCH: 31.8 pg (ref 26.0–34.0)
MCHC: 35.1 g/dL (ref 30.0–36.0)
MCV: 90.6 fL (ref 78.0–100.0)
MONO ABS: 1.3 10*3/uL — AB (ref 0.1–1.0)
MONOS PCT: 7 % (ref 3–12)
NEUTROS ABS: 15.8 10*3/uL — AB (ref 1.7–7.7)
NEUTROS PCT: 83 % — AB (ref 43–77)
Platelets: 221 10*3/uL (ref 150–400)
RBC: 5.1 MIL/uL (ref 4.22–5.81)
RDW: 13.3 % (ref 11.5–15.5)
WBC: 19.1 10*3/uL — AB (ref 4.0–10.5)

## 2013-06-13 LAB — URINE MICROSCOPIC-ADD ON

## 2013-06-13 LAB — URINALYSIS, ROUTINE W REFLEX MICROSCOPIC
BILIRUBIN URINE: NEGATIVE
Glucose, UA: NEGATIVE mg/dL
Ketones, ur: NEGATIVE mg/dL
Nitrite: NEGATIVE
PH: 7 (ref 5.0–8.0)
Protein, ur: 30 mg/dL — AB
Specific Gravity, Urine: 1.015 (ref 1.005–1.030)
Urobilinogen, UA: 0.2 mg/dL (ref 0.0–1.0)

## 2013-06-13 LAB — COMPREHENSIVE METABOLIC PANEL
ALT: 26 U/L (ref 0–53)
AST: 19 U/L (ref 0–37)
Albumin: 3.6 g/dL (ref 3.5–5.2)
Alkaline Phosphatase: 86 U/L (ref 39–117)
BUN: 14 mg/dL (ref 6–23)
CO2: 25 mEq/L (ref 19–32)
CREATININE: 0.79 mg/dL (ref 0.50–1.35)
Calcium: 9.1 mg/dL (ref 8.4–10.5)
Chloride: 103 mEq/L (ref 96–112)
GFR calc non Af Amer: 85 mL/min — ABNORMAL LOW (ref >90)
Glucose, Bld: 140 mg/dL — ABNORMAL HIGH (ref 70–99)
Potassium: 3.8 mEq/L (ref 3.7–5.3)
Sodium: 141 mEq/L (ref 137–147)
TOTAL PROTEIN: 7.5 g/dL (ref 6.0–8.3)
Total Bilirubin: 0.7 mg/dL (ref 0.3–1.2)

## 2013-06-13 LAB — LACTIC ACID, PLASMA: Lactic Acid, Venous: 1.4 mmol/L (ref 0.5–2.2)

## 2013-06-13 MED ORDER — CLOPIDOGREL BISULFATE 75 MG PO TABS
75.0000 mg | ORAL_TABLET | Freq: Every day | ORAL | Status: DC
  Administered 2013-06-14 – 2013-06-18 (×5): 75 mg via ORAL
  Filled 2013-06-13 (×5): qty 1

## 2013-06-13 MED ORDER — HYDROCODONE-ACETAMINOPHEN 5-325 MG PO TABS
1.0000 | ORAL_TABLET | ORAL | Status: DC | PRN
  Administered 2013-06-14 – 2013-06-15 (×6): 1 via ORAL
  Administered 2013-06-16: 2 via ORAL
  Administered 2013-06-16: 1 via ORAL
  Administered 2013-06-16 – 2013-06-18 (×6): 2 via ORAL
  Filled 2013-06-13: qty 1
  Filled 2013-06-13 (×2): qty 2
  Filled 2013-06-13: qty 1
  Filled 2013-06-13 (×2): qty 2
  Filled 2013-06-13 (×4): qty 1
  Filled 2013-06-13: qty 2
  Filled 2013-06-13: qty 1
  Filled 2013-06-13 (×2): qty 2

## 2013-06-13 MED ORDER — SODIUM CHLORIDE 0.9 % IV SOLN
INTRAVENOUS | Status: DC
  Administered 2013-06-13 – 2013-06-17 (×5): via INTRAVENOUS

## 2013-06-13 MED ORDER — TAMSULOSIN HCL 0.4 MG PO CAPS
0.4000 mg | ORAL_CAPSULE | Freq: Every day | ORAL | Status: DC
  Administered 2013-06-14 – 2013-06-18 (×5): 0.4 mg via ORAL
  Filled 2013-06-13 (×5): qty 1

## 2013-06-13 MED ORDER — DEXTROSE 5 % IV SOLN
1.0000 g | INTRAVENOUS | Status: DC
  Administered 2013-06-14 – 2013-06-18 (×5): 1 g via INTRAVENOUS
  Filled 2013-06-13 (×5): qty 10

## 2013-06-13 MED ORDER — HYDROMORPHONE HCL PF 1 MG/ML IJ SOLN
1.0000 mg | INTRAMUSCULAR | Status: DC | PRN
  Administered 2013-06-15 – 2013-06-16 (×4): 1 mg via INTRAVENOUS
  Filled 2013-06-13 (×4): qty 1

## 2013-06-13 MED ORDER — IOHEXOL 300 MG/ML  SOLN
25.0000 mL | INTRAMUSCULAR | Status: AC
  Administered 2013-06-13 (×2): 25 mL via ORAL

## 2013-06-13 MED ORDER — ONDANSETRON HCL 4 MG/2ML IJ SOLN: 4.0000 mg | Freq: Four times a day (QID) | INTRAMUSCULAR | Status: DC | PRN

## 2013-06-13 MED ORDER — DEXTROSE 5 % IV SOLN
1.0000 g | Freq: Once | INTRAVENOUS | Status: AC
  Administered 2013-06-13: 1 g via INTRAVENOUS
  Filled 2013-06-13: qty 10

## 2013-06-13 MED ORDER — ENOXAPARIN SODIUM 40 MG/0.4ML ~~LOC~~ SOLN
40.0000 mg | SUBCUTANEOUS | Status: DC
  Administered 2013-06-13 – 2013-06-17 (×5): 40 mg via SUBCUTANEOUS
  Filled 2013-06-13 (×6): qty 0.4

## 2013-06-13 MED ORDER — POLYETHYLENE GLYCOL 3350 17 G PO PACK
17.0000 g | PACK | Freq: Every day | ORAL | Status: DC
  Administered 2013-06-14 – 2013-06-18 (×5): 17 g via ORAL
  Filled 2013-06-13 (×5): qty 1

## 2013-06-13 MED ORDER — DUTASTERIDE 0.5 MG PO CAPS
0.5000 mg | ORAL_CAPSULE | Freq: Every morning | ORAL | Status: DC
  Administered 2013-06-14 – 2013-06-18 (×5): 0.5 mg via ORAL
  Filled 2013-06-13 (×5): qty 1

## 2013-06-13 MED ORDER — ONDANSETRON HCL 4 MG PO TABS: 4.0000 mg | ORAL_TABLET | Freq: Four times a day (QID) | ORAL | Status: DC | PRN

## 2013-06-13 MED ORDER — IOHEXOL 300 MG/ML  SOLN
100.0000 mL | Freq: Once | INTRAMUSCULAR | Status: AC | PRN
  Administered 2013-06-13: 100 mL via INTRAVENOUS

## 2013-06-13 MED ORDER — AMLODIPINE BESYLATE 5 MG PO TABS
5.0000 mg | ORAL_TABLET | Freq: Every day | ORAL | Status: DC
  Administered 2013-06-14: 5 mg via ORAL
  Filled 2013-06-13 (×2): qty 1

## 2013-06-13 MED ORDER — MORPHINE SULFATE 4 MG/ML IJ SOLN
4.0000 mg | Freq: Once | INTRAMUSCULAR | Status: AC
  Administered 2013-06-13: 4 mg via INTRAVENOUS
  Filled 2013-06-13: qty 1

## 2013-06-13 MED ORDER — DARIFENACIN HYDROBROMIDE ER 7.5 MG PO TB24
7.5000 mg | ORAL_TABLET | Freq: Every day | ORAL | Status: DC
  Administered 2013-06-14 – 2013-06-18 (×5): 7.5 mg via ORAL
  Filled 2013-06-13 (×5): qty 1

## 2013-06-13 NOTE — ED Notes (Signed)
Finished PO contrast. Notified CT.

## 2013-06-13 NOTE — ED Notes (Signed)
Family member reports pt urinating yesterday and reported pain to testicle and penis while urinating. They report pt only urinating small amounts a time since that episode and pt reports lower abd pain. Hx of frequent uti.

## 2013-06-13 NOTE — H&P (Signed)
[facility] History and Physical  [name] [name] [id number] DOB: 06/20/1934 DOA: 06/13/2013  Referring physician: ED physician PCP: Rivka SpringOHEN, PAUL S, MD   Chief Complaint: abdominal pain and urinary retention  HPI:  79 y.o. male with a PMH of CAD, angina, HTN, stroke w/ residual right sided weakness and expressive aphasia, anemia, cluster headaches, arthritis, abdominal aneurysm, peripheral vertigo, and urinary incontinence who [facility] with main concern of sudden onset of urinary retention associated with lower abdominal quadrants pain, dull and constant, 6/10 in severity and with no specific alleviating factors. Pt also reports associated dysuria, diffuse genital pain involving penis, testicles, scrotum. Pt denies fevers, chills, no change in appetite, no specific systemic concerns. Patient[place] in [place]. He has a neurologist and a vascular specialist who monitors his AAA but does not have an established PCP here in [place].   In ED, pt is hemodynamically stable. Ct abdomen and pelvis with multiple findings including left nephrolithiasis, progressive AAA, ? Liver cirrhosis and worrisome for HCC. Bladder scan done and with 276 cc PVR. UA suggestive of UTI. Pt started on Rocephin IV. TRH asked to admit to medical floor.  Assessment and Plan: Acute urinary retention - this could be multifactorial and secondary to UTI, ? Constipation, dehydration - no significant volume in bladder - will place foley in and will monitor I's and O's - place on empiric ABX Rocephin for UTI, follow up on urine culture - left nephrolithiasis noted on CT scan but no hydronephrosis - consider urology consultation in AM if no clinical improvement UTI - management as noted above HTN - continue home medical regimen  AAA - outpatient follow up ? Hepatic lesion - MRI recommended and can likely be done in an outpatient setting  Left renal lesion - MRI follow up recommended, can be  done outpatient  Leukocytosis - secondary to UTI, ABX as noted above   Code Status: Full Family Communication: Pt at bedside Disposition Plan: Admit to medical floor    Review of Systems:  Constitutional: Negative for diaphoresis.  HENT: Negative for hearing loss, ear pain, nosebleeds, congestion, sore throat, neck pain, tinnitus and ear discharge.   Eyes: Negative for blurred vision, double vision, photophobia, pain, discharge and redness.  Respiratory: Negative for cough, hemoptysis, sputum production, shortness of breath, wheezing and stridor.   Cardiovascular: Negative for chest pain, palpitations, orthopnea, claudication and leg swelling.  Gastrointestinal: Negative for heartburn, constipation, blood in stool and melena.  Genitourinary: Per HPI  Musculoskeletal: Negative for myalgias, back pain, joint pain and falls.  Skin: Negative for itching and rash.  Neurological: Negative for tingling, tremors, sensory change, speech change, focal weakness, loss of consciousness and headaches.  Endo/Heme/Allergies: Negative for environmental allergies and polydipsia. Does not bruise/bleed easily.  Psychiatric/Behavioral: Negative for suicidal ideas. The patient is not nervous/anxious.      Past Medical History  Diagnosis Date  . Coronary artery disease   . Angina   . Hypertension   . Stroke 1995, 2006    R side weak, slurred speech, exp. asphasia  . Anemia     when had stomach ulcer  . Headache(784.0)   . Arthritis   . Abdominal aneurysm   . Disorder of heart muscle     "thickened heart muscle" per wife  . Carotid artery occlusion   . Bursitis, shoulder     left  . Dizzy spells   . Autonomic orthostatic hypotension   . Peripheral vertigo   . Cluster headaches   .  Urinary incontinence     Past Surgical History  Procedure Laterality Date  . Tonsillectomy    . Hip arthroplasty  10/29/2011    Procedure: ARTHROPLASTY BIPOLAR HIP;  Surgeon: Shelda Pal, MD;  Location: [facility]  ORS;  Service: Orthopedics;  Laterality: Right;  . Fracture surgery      both hands  . Fracture surgery      Right arm X's 2, back  . Joint replacement  11/03/11    Right hip, Fx from a fall  . Carotid endarterectomy Right   . Hernia repair Left   . Liver biopsy      x2,     Social History:  reports that he quit smoking about 9 years ago. His smoking use included Cigarettes. He smoked 0.00 packs per day. He has never used smokeless tobacco. He reports that he does not drink alcohol or use illicit drugs.  No Known Allergies  Family History  Problem Relation Age of Onset  . Hypertension Mother   . Hypertension Father     Medication Sig  amLODipine (NORVASC) 5 MG tablet Take 1 tablet (5 mg total) by mouth daily.  clopidogrel (PLAVIX) 75 MG tablet Take 1 tablet (75 mg total) by mouth daily.  dutasteride (AVODART) 0.5 MG capsule Take 0.5 mg by mouth every morning.   HYDROcodone-acetaminophen Take 1-2 tablets every 4 (four) hours as needed.  solifenacin (VESICARE) 5 MG tablet Take 5 mg by mouth every morning.   tamsulosin (FLOMAX) 0.4 MG CAPS capsule Take 0.4 mg by mouth.  traMADol (ULTRAM) 50 MG tablet Take 50 mg every 8 (eight) hours as needed for pain.   Physical Exam: Filed Vitals:   06/13/13 1600 06/13/13 1811 06/13/13 1830 06/13/13 1900  BP: 160/83 134/94 153/87 149/74  Pulse: 71  67 66  Temp:      TempSrc:      Resp: 20 18 24 19   SpO2: 94% 95% 92% 92%    Physical Exam  Constitutional: Appears well-developed and well-nourished. No distress.  HENT: Normocephalic. External right and left ear normal. Oropharynx is clear and moist.  Eyes: Conjunctivae and EOM are normal. PERRLA, no scleral icterus.  Neck: Normal ROM. Neck supple. No JVD. No tracheal deviation. No thyromegaly.  CVS: RRR, S1/S2 +, no murmurs, no gallops, no carotid bruit.  Pulmonary: Effort and breath sounds normal, no stridor, rhonchi, wheezes, rales.  Abdominal: Soft. BS +,  no distension, tenderness in  lower abdominal quadrants, no rebound or guarding.  Musculoskeletal: Normal range of motion. No edema and no tenderness.  Lymphadenopathy: No lymphadenopathy noted, cervical, inguinal. Neuro: Alert. Normal reflexes, muscle tone coordination. No cranial nerve deficit. Skin: Skin is warm and dry. No rash noted. Not diaphoretic. No erythema. No pallor.  Psychiatric: Normal mood and affect. Behavior, judgment, thought content normal.   Labs on Admission:  Basic Metabolic Panel:  Recent Labs Lab 06/13/13 1228  NA 141  K 3.8  CL 103  CO2 25  GLUCOSE 140*  BUN 14  CREATININE 0.79  CALCIUM 9.1   Liver Function Tests:  Recent Labs Lab 06/13/13 1228  AST 19  ALT 26  ALKPHOS 86  BILITOT 0.7  PROT 7.5  ALBUMIN 3.6   CBC:  Recent Labs Lab 06/13/13 1228  WBC 19.1*  NEUTROABS 15.8*  HGB 16.2  HCT 46.2  MCV 90.6  PLT 221   Radiological Exams on Admission: Ct Abdomen Pelvis W Contrast   06/13/2013    1. Hyperattenuation in the dependent bladder is  favored to represent small stones. Early contrast excretion felt less likely.  2. Motion and hardware degraded exam.  3. Left nephrolithiasis.  4. Irregular hepatic contour, ? cirrhosis. New hepatic dome lesion. Although this could represent a hemangioma, given cirrhosis, HCC concern. Nonemergent outpatient pre and post contrast abdominal MRI should be considered.  5. Progression of AAA with saccular and fusiform components. No surrounding hemorrhage.  6.  Possible constipation.  7. Hyperattenuation at the penile base is favored to be vascular. Partially calcified urethral stone is felt less likely.  8. Left renal lesion which is not a simple cyst. Recommend attention on followup MRI.      EKG: Normal sinus rhythm, no ST/T wave changes  Debbora Presto, MD  [facility] Pager [phone]  If 7PM-7AM, please contact night-coverage [email or web address] Password [facility] 06/13/2013, 7:16 PM

## 2013-06-13 NOTE — ED Provider Notes (Signed)
CSN: [id number]     Arrival date & time 06/13/13  1203 History   None    Chief Complaint  Patient presents with  . Urinary Retention  . Dysuria  . Abdominal Pain    HPI  ABDOULIE [name] is a 79 y.o. male with a PMH of CAD, angina, HTN, stroke w/ residual right sided weakness and expressive aphasia, anemia, cluster headaches, arthritis, abdominal aneurysm, peripheral vertigo, and urinary incontinence who presents to the ED for evaluation of urinary retention, dysuria, and abdominal pain.  History was provided by the patient's wife.  Patient complained of lower diffuse abdominal pain yesterday afternoon.  He also has had difficulty with urination and dysuria.  He also complains of diffuse genital pain in his penis, testicles, and scrotum.  No hematuria.  Patient has a hx of frequent UTI's.  No fevers, chills, change in appetite/activity, diarrhea, vomiting, constipation, or confusion. He otherwise has been well with no cough, rhinorrhea, congestion, chest pain, SOB, or headaches.  Patient's PCP is in [place].  He has a neurologist and a vascular specialist who monitors his AAA but does not have an established PCP here in [place].   Past Medical History  Diagnosis Date  . Coronary artery disease   . Angina   . Hypertension   . Stroke 1995, 2006    R side weak, slurred speech, exp. asphasia  . Anemia     when had stomach ulcer  . Headache(784.0)   . Arthritis   . Abdominal aneurysm   . Disorder of heart muscle     "thickened heart muscle" per wife  . Carotid artery occlusion   . Bursitis, shoulder     left  . Dizzy spells   . Autonomic orthostatic hypotension   . Peripheral vertigo   . Cluster headaches   . Urinary incontinence    Past Surgical History  Procedure Laterality Date  . Tonsillectomy    . Hip arthroplasty  10/29/2011    Procedure: ARTHROPLASTY BIPOLAR HIP;  Surgeon: Shelda Pal, MD;  Location: [facility];  Service: Orthopedics;  Laterality: Right;  .  Fracture surgery      both hands  . Fracture surgery      Right arm X's 2, back  . Joint replacement  11/03/11    Right hip, Fx from a fall  . Carotid endarterectomy Right   . Hernia repair Left   . Liver biopsy      x2,    Family History  Problem Relation Age of Onset  . Hypertension Mother   . Hypertension Father    History  Substance Use Topics  . Smoking status: Former Smoker    Types: Cigarettes    Quit date: 06/30/2003  . Smokeless tobacco: Never Used  . Alcohol Use: No    Review of Systems  Constitutional: Negative for fever, chills, diaphoresis, activity change, appetite change and fatigue.  HENT: Negative for congestion, rhinorrhea and sore throat.   Eyes: Negative for photophobia and visual disturbance.  Respiratory: Negative for cough and shortness of breath.   Cardiovascular: Negative for chest pain and leg swelling.  Gastrointestinal: Positive for abdominal pain. Negative for nausea, vomiting, diarrhea and constipation.  Genitourinary: Positive for dysuria, urgency, difficulty urinating, penile pain and testicular pain. Negative for hematuria, flank pain, penile swelling, scrotal swelling and genital sores.  Musculoskeletal: Negative for back pain and myalgias.  Skin: Negative for color change and wound.  Neurological: Positive for weakness (at baseline). Negative for light-headedness  and headaches.  Psychiatric/Behavioral: Negative for confusion.    Allergies  Review of patient's allergies indicates no known allergies.  Home Medications   Current Outpatient Rx  Name  Route  Sig  Dispense  Refill  . amLODipine (NORVASC) 5 MG tablet   Oral   Take 1 tablet (5 mg total) by mouth daily.   30 tablet   12   . Calcium Carbonate-Vit D-Min (CALCIUM 600 + MINERALS PO)   Oral   Take 1 tablet by mouth 2 (two) times daily.          . clopidogrel (PLAVIX) 75 MG tablet   Oral   Take 1 tablet (75 mg total) by mouth daily.   30 tablet   12   . dutasteride  (AVODART) 0.5 MG capsule   Oral   Take 0.5 mg by mouth every morning.          Marland Kitchen. HYDROcodone-acetaminophen (NORCO/VICODIN) 5-325 MG per tablet   Oral   Take 1-2 tablets by mouth every 4 (four) hours as needed.         . Multiple Vitamin (MULTIVITAMIN WITH MINERALS) TABS tablet   Oral   Take 1 tablet by mouth daily.         . Multiple Vitamins-Minerals (MACULAR VITAMIN BENEFIT) TABS   Oral   Take 1 tablet by mouth daily.         . polyethylene glycol (MIRALAX / GLYCOLAX) packet   Oral   Take 17 g by mouth daily.          . solifenacin (VESICARE) 5 MG tablet   Oral   Take 5 mg by mouth every morning.          . tamsulosin (FLOMAX) 0.4 MG CAPS capsule   Oral   Take 0.4 mg by mouth.         . traMADol (ULTRAM) 50 MG tablet   Oral   Take 50 mg by mouth every 8 (eight) hours as needed for pain.          BP 178/86  Pulse 96  Temp(Src) 97.9 F (36.6 C) (Oral)  Resp 20  SpO2 94%  Filed Vitals:   06/13/13 1500 06/13/13 1515 06/13/13 1538 06/13/13 1600  BP: 149/90 139/59  160/83  Pulse: 81 81  71  Temp:   99.9 F (37.7 C)   TempSrc:   Rectal   Resp: 24 18  20   SpO2: 94% 96%  94%    Physical Exam  Nursing note and vitals reviewed. Constitutional: He is oriented to person, place, and time. He appears well-developed and well-nourished. No distress.  HENT:  Head: Normocephalic and atraumatic.  Right Ear: External ear normal.  Left Ear: External ear normal.  Nose: Nose normal.  Mouth/Throat: Oropharynx is clear and moist. No oropharyngeal exudate.  Eyes: Conjunctivae are normal. Pupils are equal, round, and reactive to light. Right eye exhibits no discharge. Left eye exhibits no discharge.  Neck: Normal range of motion. Neck supple.  Cardiovascular: Normal rate, regular rhythm and normal heart sounds.  Exam reveals no gallop and no friction rub.   No murmur heard. Pulmonary/Chest: Effort normal and breath sounds normal. No respiratory distress. He has  no wheezes. He has no rales. He exhibits no tenderness.  Abdominal: Soft. He exhibits no distension and no mass. There is tenderness. There is no rebound and no guarding.  Diffuse tenderness to palpation to the abdomen throughout more tender in the lower middle abdomen.  Genitourinary: No penile tenderness.  No testicular tenderness bilaterally.  No penile tenderness.    Musculoskeletal: Normal range of motion. He exhibits no edema and no tenderness.  No pedal edema or calf tenderness bilaterally.  Neurological: He is alert and oriented to person, place, and time.  Expressive aphasia.  Right sided weakness.    Skin: Skin is warm and dry. He is not diaphoretic.    ED Course  Procedures (including critical care time) Labs Review Labs Reviewed  COMPREHENSIVE METABOLIC PANEL - Abnormal; Notable for the following:    Glucose, Bld 140 (*)    GFR calc non Af Amer 85 (*)    All other components within normal limits  CBC WITH DIFFERENTIAL - Abnormal; Notable for the following:    WBC 19.1 (*)    Neutrophils Relative % 83 (*)    Neutro Abs 15.8 (*)    Lymphocytes Relative 9 (*)    Monocytes Absolute 1.3 (*)    All other components within normal limits  URINALYSIS, ROUTINE W REFLEX MICROSCOPIC - Abnormal; Notable for the following:    APPearance TURBID (*)    Hgb urine dipstick MODERATE (*)    Protein, ur 30 (*)    Leukocytes, UA LARGE (*)    All other components within normal limits  URINE MICROSCOPIC-ADD ON   Imaging Review No results found.  EKG Interpretation    Date/Time:    Ventricular Rate:    PR Interval:    QRS Duration:   QT Interval:    QTC Calculation:   R Axis:     Text Interpretation:             Results for orders placed during the hospital encounter of 06/13/13  COMPREHENSIVE METABOLIC PANEL      Result Value Range   Sodium 141  137 - 147 mEq/L   Potassium 3.8  3.7 - 5.3 mEq/L   Chloride 103  96 - 112 mEq/L   CO2 25  19 - 32 mEq/L   Glucose, Bld  140 (*) 70 - 99 mg/dL   BUN 14  6 - 23 mg/dL   Creatinine, Ser 1.61  0.50 - 1.35 mg/dL   Calcium 9.1  8.4 - 09.6 mg/dL   Total Protein 7.5  6.0 - 8.3 g/dL   Albumin 3.6  3.5 - 5.2 g/dL   AST 19  0 - 37 U/L   ALT 26  0 - 53 U/L   Alkaline Phosphatase 86  39 - 117 U/L   Total Bilirubin 0.7  0.3 - 1.2 mg/dL   GFR calc non Af Amer 85 (*) >90 mL/min   GFR calc Af Amer >90  >90 mL/min  CBC WITH DIFFERENTIAL      Result Value Range   WBC 19.1 (*) 4.0 - 10.5 K/uL   RBC 5.10  4.22 - 5.81 MIL/uL   Hemoglobin 16.2  13.0 - 17.0 g/dL   HCT 04.5  40.9 - 81.1 %   MCV 90.6  78.0 - 100.0 fL   MCH 31.8  26.0 - 34.0 pg   MCHC 35.1  30.0 - 36.0 g/dL   RDW 91.4  78.2 - 95.6 %   Platelets 221  150 - 400 K/uL   Neutrophils Relative % 83 (*) 43 - 77 %   Neutro Abs 15.8 (*) 1.7 - 7.7 K/uL   Lymphocytes Relative 9 (*) 12 - 46 %   Lymphs Abs 1.6  0.7 - 4.0 K/uL   Monocytes Relative 7  3 - 12 %   Monocytes Absolute 1.3 (*) 0.1 - 1.0 K/uL   Eosinophils Relative 2  0 - 5 %   Eosinophils Absolute 0.3  0.0 - 0.7 K/uL   Basophils Relative 0  0 - 1 %   Basophils Absolute 0.1  0.0 - 0.1 K/uL  URINALYSIS, ROUTINE W REFLEX MICROSCOPIC      Result Value Range   Color, Urine YELLOW  YELLOW   APPearance TURBID (*) CLEAR   Specific Gravity, Urine 1.015  1.005 - 1.030   pH 7.0  5.0 - 8.0   Glucose, UA NEGATIVE  NEGATIVE mg/dL   Hgb urine dipstick MODERATE (*) NEGATIVE   Bilirubin Urine NEGATIVE  NEGATIVE   Ketones, ur NEGATIVE  NEGATIVE mg/dL   Protein, ur 30 (*) NEGATIVE mg/dL   Urobilinogen, UA 0.2  0.0 - 1.0 mg/dL   Nitrite NEGATIVE  NEGATIVE   Leukocytes, UA LARGE (*) NEGATIVE  URINE MICROSCOPIC-ADD ON      Result Value Range   WBC, UA 21-50  <3 WBC/hpf   RBC / HPF 3-6  <3 RBC/hpf   Urine-Other AMORPHOUS URATES/PHOSPHATES    LACTIC ACID, PLASMA      Result Value Range   Lactic Acid, Venous 1.4  0.5 - 2.2 mmol/L    CT Abdomen Pelvis W Contrast (Final result)  Result time: 06/13/13 17:25:02     Final result by Rad Results In Interface (06/13/13 17:25:02)    Narrative:   CLINICAL DATA: Pain at the penis and testicle while urinating. History of frequent urinary tract infections. Abdominal aortic aneurysm. Prior liver biopsy.  EXAM: CT ABDOMEN AND PELVIS WITH CONTRAST  TECHNIQUE: Multidetector CT imaging of the abdomen and pelvis was performed using the standard protocol following bolus administration of intravenous contrast.  CONTRAST: OMNIPAQUE IOHEXOL 300 MG/ML SOLN  COMPARISON: CT ABD W/CM dated 08/07/2004; CT ABD W/CM dated 01/14/2003  FINDINGS: Lower Chest: Mild bibasilar volume loss. Mild cardiomegaly with atherosclerosis in the coronary arteries. No pericardial or pleural effusion.  Abdomen/Pelvis: Irregular hepatic contour, most consistent with cirrhosis. Underline probable hepatic steatosis. Heterogeneous enhancement in the left lobe of the liver on image 22/series 2 could represent heterogeneous steatosis or altered perfusion. There is a hepatic dome irregular peripherally enhancing lesion which measures 5.0 x 4.8 cm on image 15/ series 2. This is new since the prior exam. Portal veins patent. Normal spleen, stomach, pancreas.  Mild motion degradation. Normal gallbladder, biliary tract, right adrenal gland. Mild left adrenal thickening and nodularity is chronic.  Scarring in the lower pole right kidney with a dominant 8.2 cm inter/lower pole right renal cyst. Left renal cyst and bilateral too small to characterize renal lesions. A lower pole left renal lesion measures 8 mm and greater than fluid density on image 54/ series 2. This is at the site of a larger lower density lesion on the prior exam.  Punctate lower pole left renal collecting system calculus. No hydronephrosis or hydroureter.  Aortic and branch vessel atherosclerosis. Infrarenal abdominal aortic aneurysm at maximally 4.3 cm today. 3.5 cm at the same level on the prior. A component of  chronic dissection is identified in this area.  More focal wall saccular aneurysm is seen just above the aortic bifurcation at 2.5 cm on image 64. This is increased from 1.1 cm at the same level on the prior. No surrounding hemorrhage or adenopathy in the retroperitoneum.  Degraded evaluation of the pelvis, secondary to right hip arthroplasty and resultant beam hardening artifact.  Colonic stool burden suggests constipation. Normal terminal ileum and appendix. Normal small bowel without abdominal ascites.  Atherosclerosis but no pelvic aneurysm. No pelvic adenopathy. Fat containing right inguinal hernia.  Normal prostate. Foci of increased density within the dependent urinary bladder on image 89 are suspicious for small stones. No significant free fluid. Hyperattenuation in the penile base on image 106/series 2 is not calcified and is favored to be vascular.  Bones/Musculoskeletal: Right hip arthroplasty. Osteopenia. Moderate L2 and mild L4 compression deformities.  IMPRESSION: 1. Hyperattenuation in the dependent bladder is favored to represent small stones. Early contrast excretion felt less likely. 2. Motion and hardware degraded exam. 3. Left nephrolithiasis. 4. Irregular hepatic contour, suspicious for cirrhosis. New hepatic dome lesion which demonstrates peripheral enhancement. Although this could represent a hemangioma, given cirrhosis, hepatocellular carcinoma is a concern. Nonemergent outpatient pre and post contrast abdominal MRI should be considered. 5. Progression of abdominal aortic aneurysm with saccular and fusiform components. No surrounding hemorrhage. 6. Possible constipation. 7. Hyperattenuation at the penile base is favored to be vascular. Partially calcified urethral stone is felt less likely. 8. Left renal lesion which is not a simple cyst. Recommend attention on followup MRI.   Electronically Signed By: Jeronimo Greaves M.D. On: 06/13/2013 17:25          MDM   TAKEO HARTS is a 79 y.o. male with a PMH of CAD, angina, HTN, stroke w/ residual right sided weakness and expressive aphasia, anemia, cluster headaches, arthritis, abdominal aneurysm, peripheral vertigo, and urinary incontinence who presents to the ED for evaluation of urinary retention, dysuria, and abdominal pain.     Rechecks  3:32 PM = 276 ml on bladder scan.   3:45 PM = Patient started on Rocephin for UTI  5:20 PM = Pain currently well controlled.  5:55 PM = Pain returning.  Intermittent episodes of spasm.    6:30 PM = Pain returning.  Another 4 mg morphine ordered.    Consults  6:00 PM = Spoke with IIzola Price who agrees to admit   Patient for further evaluation and management of his abdominal pain.  Patient found to have a UTI and was treated with Rocephin in the ED.  He was found to have kidney stones, which may have been the cause of his pain, however there is no evidence of obstruction/hydronephrosis.  Pain not well controlled in the ED with morphine.  Patient afebrile however has an elevated WBC count of 19.1.  Lactic acid WNL.  Labs otherwise unremarkable.  Patient's AAA is stable however has increased from his previous study (4.3 cm today).  Patient also had some liver and kidney findings which require follow-up MRI to further evaluate.  Patient evaluated for further evaluation and management.    Final impressions: 1. UTI (urinary tract infection)   2. Kidney stones   3. Leukocytosis   4. Abdominal  pain, other specified site   5. Abdominal aneurysm without mention of rupture       Luiz Iron PA-C   This patient was discussed with Dr. Barbarann Ehlers, PA-C 06/14/13 [phone]

## 2013-06-13 NOTE — ED Provider Notes (Signed)
Level V caveat. Patient has expressive aphasia. Patient developed suprapubic pain and dysuria onset this afternoon. No vomiting no known fever no other. on exam chronically ill-appearing abdomen nondistended suprapubic tenderness. Genitalia normal male. Bladder scan performed showing 276 mL of urine postvoid residual  Doug SouSam Lc Joynt, MD 06/14/13 [phone]

## 2013-06-13 NOTE — ED Notes (Signed)
Jessica PA- made aware of pt pain and sts can have drink.

## 2013-06-13 NOTE — ED Notes (Signed)
Patient transported to CT 

## 2013-06-13 NOTE — ED Notes (Signed)
276mL in bladder scanner. Dr. Ethelda ChickJacubowitz made aware.

## 2013-06-14 DIAGNOSIS — I10 Essential (primary) hypertension: Secondary | ICD-10-CM

## 2013-06-14 LAB — BASIC METABOLIC PANEL
BUN: 14 mg/dL (ref 6–23)
CHLORIDE: 103 meq/L (ref 96–112)
CO2: 25 mEq/L (ref 19–32)
CREATININE: 0.74 mg/dL (ref 0.50–1.35)
Calcium: 8.9 mg/dL (ref 8.4–10.5)
GFR calc Af Amer: >90 mL/min (ref >90)
GFR calc non Af Amer: 87 mL/min — ABNORMAL LOW (ref >90)
Glucose, Bld: 100 mg/dL — ABNORMAL HIGH (ref 70–99)
POTASSIUM: 3.6 meq/L — AB (ref 3.7–5.3)
Sodium: 140 mEq/L (ref 137–147)

## 2013-06-14 LAB — CBC
HEMATOCRIT: 42.7 % (ref 39.0–52.0)
HEMOGLOBIN: 14.4 g/dL (ref 13.0–17.0)
MCH: 31 pg (ref 26.0–34.0)
MCHC: 33.7 g/dL (ref 30.0–36.0)
MCV: 91.8 fL (ref 78.0–100.0)
Platelets: 171 10*3/uL (ref 150–400)
RBC: 4.65 MIL/uL (ref 4.22–5.81)
RDW: 13.5 % (ref 11.5–15.5)
WBC: 13.3 10*3/uL — AB (ref 4.0–10.5)

## 2013-06-14 LAB — URINE CULTURE: Colony Count: 70000

## 2013-06-14 MED ORDER — DOCUSATE SODIUM 100 MG PO CAPS
100.0000 mg | ORAL_CAPSULE | Freq: Two times a day (BID) | ORAL | Status: DC
  Administered 2013-06-14 – 2013-06-18 (×9): 100 mg via ORAL
  Filled 2013-06-14 (×10): qty 1

## 2013-06-14 MED ORDER — SENNA 8.6 MG PO TABS
1.0000 | ORAL_TABLET | Freq: Every day | ORAL | Status: DC
  Administered 2013-06-14 – 2013-06-18 (×5): 8.6 mg via ORAL
  Filled 2013-06-14 (×5): qty 1

## 2013-06-14 NOTE — Progress Notes (Signed)
Utilization review completed.  

## 2013-06-14 NOTE — Progress Notes (Signed)
Patient ID: [name] [name]  male  ZOX:[id number]    DOB: 07/30/1934    DOA: 06/13/2013  PCP: Rivka Spring, MD  Assessment/Plan: Active Problems:   UTI (urinary tract infection) with  Urinary retention - Continue IV antibiotics, follow up on urine culture, CT abdomen and pelvis reviewed, no hydronephrosis or acute obstruction  Hypertension: - Currently stable  Hepatic lesion and left renal lesion - Outpatient MRI  Constipation - Started on Colace, senna, miralax  DVT Prophylaxis:  Code Status:  Family Communication:  Disposition:    Subjective: Feels uncomfortable, states he had not had a bowel movement yet. Foley catheter in, afebrile, no chest pain, shortness of breath, vague historian  Objective: Weight change:   Intake/Output Summary (Last 24 hours) at 06/14/13 1230 Last data filed at 06/14/13 0523  Gross per 24 hour  Intake   1065 ml  Output   1250 ml  Net   -185 ml   Blood pressure 145/60, pulse 70, temperature 98.3 F (36.8 C), temperature source Rectal, resp. rate 20, SpO2 97.00%.  Physical Exam: General: Alert and awake, oriented x3, not in any acute distress. CVS: S1-S2 clear, no murmur rubs or gallops Chest: clear to auscultation bilaterally, no wheezing, rales or rhonchi Abdomen: soft mild diffuse tenderness, nondistended, normal bowel sounds  Extremities: no cyanosis, clubbing or edema noted bilaterally Neuro: Cranial nerves II-XII intact, no focal neurological deficits  Lab Results: Basic Metabolic Panel:  Recent Labs Lab 06/13/13 1228 06/14/13 0455  NA 141 140  K 3.8 3.6*  CL 103 103  CO2 25 25  GLUCOSE 140* 100*  BUN 14 14  CREATININE 0.79 0.74  CALCIUM 9.1 8.9   Liver Function Tests:  Recent Labs Lab 06/13/13 1228  AST 19  ALT 26  ALKPHOS 86  BILITOT 0.7  PROT 7.5  ALBUMIN 3.6   No results found for this basename: LIPASE, AMYLASE,  in the last 168 hours No results found for this basename: AMMONIA,  in the last 168  hours CBC:  Recent Labs Lab 06/13/13 1228 06/14/13 0455  WBC 19.1* 13.3*  NEUTROABS 15.8*  --   HGB 16.2 14.4  HCT 46.2 42.7  MCV 90.6 91.8  PLT 221 171   Cardiac Enzymes: No results found for this basename: CKTOTAL, CKMB, CKMBINDEX, TROPONINI,  in the last 168 hours BNP: No components found with this basename: POCBNP,  CBG: No results found for this basename: GLUCAP,  in the last 168 hours   Micro Results: No results found for this or any previous visit (from the past 240 hour(s)).  Studies/Results: Ct Abdomen Pelvis W Contrast  06/13/2013   CLINICAL DATA:  Pain at the penis and testicle while urinating. History of frequent urinary tract infections. Abdominal aortic aneurysm. Prior liver biopsy.  EXAM: CT ABDOMEN AND PELVIS WITH CONTRAST  TECHNIQUE: Multidetector CT imaging of the abdomen and pelvis was performed using the standard protocol following bolus administration of intravenous contrast.  CONTRAST:  OMNIPAQUE IOHEXOL 300 MG/ML  SOLN  COMPARISON:  CT ABD W/CM dated 08/07/2004; CT ABD W/CM dated 01/14/2003  FINDINGS: Lower Chest: Mild bibasilar volume loss. Mild cardiomegaly with atherosclerosis in the coronary arteries. No pericardial or pleural effusion.  Abdomen/Pelvis: Irregular hepatic contour, most consistent with cirrhosis. Underline probable hepatic steatosis. Heterogeneous enhancement in the left lobe of the liver on image 22/series 2 could represent heterogeneous steatosis or altered perfusion. There is a hepatic dome irregular peripherally enhancing lesion which measures 5.0 x 4.8 cm on image  15/ series 2. This is new since the prior exam. Portal veins patent. Normal spleen, stomach, pancreas.  Mild motion degradation. Normal gallbladder, biliary tract, right adrenal gland. Mild left adrenal thickening and nodularity is chronic.  Scarring in the lower pole right kidney with a dominant 8.2 cm inter/lower pole right renal cyst. Left renal cyst and bilateral too small to  characterize renal lesions. A lower pole left renal lesion measures 8 mm and greater than fluid density on image 54/ series 2. This is at the site of a larger lower density lesion on the prior exam.  Punctate lower pole left renal collecting system calculus. No hydronephrosis or hydroureter.  Aortic and branch vessel atherosclerosis. Infrarenal abdominal aortic aneurysm at maximally 4.3 cm today. 3.5 cm at the same level on the prior. A component of chronic dissection is identified in this area.  More focal wall saccular aneurysm is seen just above the aortic bifurcation at 2.5 cm on image 64. This is increased from 1.1 cm at the same level on the prior. No surrounding hemorrhage or adenopathy in the retroperitoneum.  Degraded evaluation of the pelvis, secondary to right hip arthroplasty and resultant beam hardening artifact.  Colonic stool burden suggests constipation. Normal terminal ileum and appendix. Normal small bowel without abdominal ascites.  Atherosclerosis but no pelvic aneurysm. No pelvic adenopathy. Fat containing right inguinal hernia.  Normal prostate. Foci of increased density within the dependent urinary bladder on image 89 are suspicious for small stones. No significant free fluid. Hyperattenuation in the penile base on image 106/series 2 is not calcified and is favored to be vascular.  Bones/Musculoskeletal: Right hip arthroplasty. Osteopenia. Moderate L2 and mild L4 compression deformities.  IMPRESSION: 1. Hyperattenuation in the dependent bladder is favored to represent small stones. Early contrast excretion felt less likely. 2. Motion and hardware degraded exam. 3. Left nephrolithiasis. 4. Irregular hepatic contour, suspicious for cirrhosis. New hepatic dome lesion which demonstrates peripheral enhancement. Although this could represent a hemangioma, given cirrhosis, hepatocellular carcinoma is a concern. Nonemergent outpatient pre and post contrast abdominal MRI should be considered. 5.  Progression of abdominal aortic aneurysm with saccular and fusiform components. No surrounding hemorrhage. 6.  Possible constipation. 7. Hyperattenuation at the penile base is favored to be vascular. Partially calcified urethral stone is felt less likely. 8. Left renal lesion which is not a simple cyst. Recommend attention on followup MRI.   Electronically Signed   By: Jeronimo GreavesKyle  Talbot M.D.   On: 06/13/2013 17:25    Medications: Scheduled Meds: . amLODipine  5 mg Oral Daily  . cefTRIAXone (ROCEPHIN)  IV  1 g Intravenous Q24H  . clopidogrel  75 mg Oral Daily  . darifenacin  7.5 mg Oral Daily  . dutasteride  0.5 mg Oral q morning - 10a  . enoxaparin (LOVENOX) injection  40 mg Subcutaneous Q24H  . polyethylene glycol  17 g Oral Daily  . tamsulosin  0.4 mg Oral Daily      LOS: 1 day   Ericca Labra M.D. [facility] 06/14/2013, 12:30 PM Pager: [phone]  If 7PM-7AM, please contact night-coverage [email or web address] Password [email or web address]

## 2013-06-15 DIAGNOSIS — N179 Acute kidney failure, unspecified: Secondary | ICD-10-CM

## 2013-06-15 MED ORDER — AMLODIPINE BESYLATE 10 MG PO TABS
10.0000 mg | ORAL_TABLET | Freq: Every day | ORAL | Status: DC
  Administered 2013-06-15 – 2013-06-18 (×4): 10 mg via ORAL
  Filled 2013-06-15 (×4): qty 1

## 2013-06-15 MED ORDER — FLEET ENEMA 7-19 GM/118ML RE ENEM
1.0000 | ENEMA | Freq: Once | RECTAL | Status: AC
  Administered 2013-06-15: 1 via RECTAL
  Filled 2013-06-15: qty 1

## 2013-06-15 NOTE — Evaluation (Addendum)
Physical Therapy Evaluation Patient Details Name: [name] [name] MRN: [id number] DOB: [birth year] Today's Date: 06/15/2013 Time: 4098-1191 PT Time Calculation (min): 29 min  PT Assessment / Plan / Recommendation History of Present Illness  Pt is a 79 y/o male admitted with UTI and severe abdominal pain as a result. PMH is significant for CVA x2 (1995, 2006) and pt demonstrating expressive aphasia. Wife reports she provides significant amount of assist for him at home with ADL's and transfers, and pt does not walk.   Clinical Impression  This patient presents with acute pain and decreased functional independence following the above. At the time of PT eval, pt was in too much pain to complete transfer to EOB. Wife states he is able to perform a SPT at home, but does not walk. It appears that pt is near his baseline of function, and wife would like him to return home, which is why recommending HHPT to follow at d/c if progress is made. However, wife reports that she is already providing a high level of physical assist for him at home, and if progress is not made, may be beneficial to explore other options for both pt safety as well as wife's safety.     PT Assessment  Patient needs continued PT services    Follow Up Recommendations  Home health PT    Does the patient have the potential to tolerate intense rehabilitation      Barriers to Discharge Other (comment) Wife states she is his support with no relief, and it is very physically demanding on her. From a PT perspective, unsure how much longer she will be able to provide such a high level of assist for him at home.    Equipment Recommendations  None recommended by PT    Recommendations for Other Services     Frequency Min 3X/week    Precautions / Restrictions Precautions Precautions: Fall Restrictions Weight Bearing Restrictions: No   Pertinent Vitals/Pain Pt has 3 episodes of severe pain throughout session that decrease within 30  seconds to 1 minute. RN notified and pain medicine being delivered when PT exiting.       Mobility  Bed Mobility Bed Mobility: Supine to Sit Supine to Sit: 1: +2 Total assist;HOB flat;With rails Supine to Sit: Patient Percentage: 40% Details for Bed Mobility Assistance: Unable to complete transfer due to abdominal pain from UTI. Transfers Transfers: Not assessed Ambulation/Gait Ambulation/Gait Assistance: Not tested (comment) Stairs: No Wheelchair Mobility Wheelchair Mobility: No    Exercises     PT Diagnosis: Acute pain;Generalized weakness  PT Problem List: Decreased strength;Decreased range of motion;Decreased activity tolerance;Decreased balance;Decreased mobility;Decreased knowledge of use of DME;Decreased safety awareness;Pain PT Treatment Interventions: DME instruction;Gait training;Functional mobility training;Therapeutic activities;Therapeutic exercise;Neuromuscular re-education;Patient/family education     PT Goals(Current goals can be found in the care plan section) Acute Rehab PT Goals Patient Stated Goal: Pt unable to state goals, however wife would like him to return home.  PT Goal Formulation: With patient/family Time For Goal Achievement: 06/29/13 Potential to Achieve Goals: Fair  Visit Information  Last PT Received On: 06/15/13 Assistance Needed: +2 History of Present Illness: Pt is a 79 y/o male admitted with UTI and severe abdominal pain as a result. PMH is significant for CVA x2 (1995, 2006) and pt demonstrating expressive aphasia. Wife reports she provides significant amount of assist for him at home with ADL's and transfers, and pt does not walk.        Prior Functioning  Home Living Family/patient  expects to be discharged to:: Private residence Living Arrangements: Spouse/significant other Available Help at Discharge: Family;Available 24 hours/day Type of Home: House Home Access: Ramped entrance Home Layout: One level Home Equipment: Wheelchair -  Technical sales engineermanual;Electric scooter;Walker - 2 wheels;Cane - quad;Other (comment) (Lift chair in living room) Prior Function Level of Independence: Needs assistance Gait / Transfers Assistance Needed: Transfers fair, does not walk ADL's / Homemaking Assistance Needed: Wife assists with lower body ADL's Communication Communication: Expressive difficulties;HOH Dominant Hand: Right    Cognition  Cognition Arousal/Alertness: Awake/alert Behavior During Therapy: WFL for tasks assessed/performed Overall Cognitive Status: Within Functional Limits for tasks assessed    Extremity/Trunk Assessment Upper Extremity Assessment Upper Extremity Assessment: RUE deficits/detail;Generalized weakness RUE Deficits / Details: Weakness and decreased ROM as a result of CVA. Pt is able to "use it" if he is looking at his arm, per wife. Wife also states that pt has fracture in RUE as a result of a fall x5 weeks ago.  Lower Extremity Assessment Lower Extremity Assessment: RLE deficits/detail;Generalized weakness RLE Deficits / Details: Decreased strength and ROM due to CVA. Wife states pt has fracture in RLE due to fall x5 weeks ago Cervical / Trunk Assessment Cervical / Trunk Assessment: Kyphotic   Balance Balance Balance Assessed: No  End of Session PT - End of Session Activity Tolerance: Patient limited by pain Patient left: in bed;with call bell/phone within reach;with family/visitor present Nurse Communication: Mobility status;Patient requests pain meds  GP     Ruthann CancerHamilton, Jaliah Foody 06/15/2013, 4:44 PM  Ruthann CancerLaura Hamilton, PT, DPT [phone]

## 2013-06-15 NOTE — Progress Notes (Signed)
Spoke with Dr. Isidoro Donningai regarding patient's coude catheter which was due to come out this evening.  Instructed to leave cath in place and she will speak with urology in the am as well as coordinate a follow-up outpatient appointment upon discharge.

## 2013-06-15 NOTE — Progress Notes (Signed)
Patient ID: [name] [name]  male  ZOX:[id number]    DOB: 01/07/1935    DOA: 06/13/2013  PCP: Rivka Spring, MD  Assessment/Plan: Active Problems:   UTI (urinary tract infection) with  Urinary retention - Urine culture with 70,000 colonies - Continue Foley catheter - Continue IV antibiotics, CT abdomen and pelvis reviewed, no hydronephrosis or acute obstruction  Hypertension: - Currently stable  Hepatic lesion and left renal lesion - Outpatient MRI  Constipation - Per patient, still no BM, will start with Fleet Enema - Continue Colace, senna, miralax  BPH, continue Avodart, Flomax  DVT Prophylaxis:  Code Status:  Family Communication:  Disposition: TBD PT elevation today    Subjective: Poor historian, mostly answers "I do not know" to questions.   Objective: Weight change:   Intake/Output Summary (Last 24 hours) at 06/15/13 1246 Last data filed at 06/15/13 0900  Gross per 24 hour  Intake    360 ml  Output   3500 ml  Net  -3140 ml   Blood pressure 147/71, pulse 69, temperature 97.6 F (36.4 C), temperature source Oral, resp. rate 18, weight 78.109 kg (172 lb 3.2 oz), SpO2 95.00%.  Physical Exam: General: Alert and awake, not in any acute distress. CVS: S1-S2 clear, no murmur rubs or gallops Chest: clear to auscultation bilaterally, no wheezing, rales or rhonchi Abdomen: soft NT, ND, NBS Extremities: no cyanosis, clubbing or edema noted bilaterally   Lab Results: Basic Metabolic Panel:  Recent Labs Lab 06/13/13 1228 06/14/13 0455  NA 141 140  K 3.8 3.6*  CL 103 103  CO2 25 25  GLUCOSE 140* 100*  BUN 14 14  CREATININE 0.79 0.74  CALCIUM 9.1 8.9   Liver Function Tests:  Recent Labs Lab 06/13/13 1228  AST 19  ALT 26  ALKPHOS 86  BILITOT 0.7  PROT 7.5  ALBUMIN 3.6   No results found for this basename: LIPASE, AMYLASE,  in the last 168 hours No results found for this basename: AMMONIA,  in the last 168 hours CBC:  Recent Labs Lab  06/13/13 1228 06/14/13 0455  WBC 19.1* 13.3*  NEUTROABS 15.8*  --   HGB 16.2 14.4  HCT 46.2 42.7  MCV 90.6 91.8  PLT 221 171   Cardiac Enzymes: No results found for this basename: CKTOTAL, CKMB, CKMBINDEX, TROPONINI,  in the last 168 hours BNP: No components found with this basename: POCBNP,  CBG: No results found for this basename: GLUCAP,  in the last 168 hours   Micro Results: Recent Results (from the past 240 hour(s))  URINE CULTURE     Status: None   Collection Time    06/13/13 12:28 PM      Result Value Range Status   Specimen Description URINE, CLEAN CATCH   Final   Special Requests NONE   Final   Culture  Setup Time     Final   Value: 06/14/2013 01:32     Performed at [facility] Count     Final   Value: 70,000 COLONIES/ML     Performed at [facility]   Culture     Final   Value: Multiple bacterial morphotypes present, none predominant. Suggest appropriate recollection if clinically indicated.     Performed at [facility]   Report Status 06/14/2013 FINAL   Final    Studies/Results: Ct Abdomen Pelvis W Contrast  06/13/2013   CLINICAL DATA:  Pain at the penis and testicle while urinating. History of  frequent urinary tract infections. Abdominal aortic aneurysm. Prior liver biopsy.  EXAM: CT ABDOMEN AND PELVIS WITH CONTRAST  TECHNIQUE: Multidetector CT imaging of the abdomen and pelvis was performed using the standard protocol following bolus administration of intravenous contrast.  CONTRAST:  100mL OMNIPAQUE IOHEXOL 300 MG/ML  SOLN  COMPARISON:  CT ABD W/CM dated 08/07/2004; CT ABD W/CM dated 01/14/2003  FINDINGS: Lower Chest: Mild bibasilar volume loss. Mild cardiomegaly with atherosclerosis in the coronary arteries. No pericardial or pleural effusion.  Abdomen/Pelvis: Irregular hepatic contour, most consistent with cirrhosis. Underline probable hepatic steatosis. Heterogeneous enhancement in the left lobe of the liver on image 22/series 2  could represent heterogeneous steatosis or altered perfusion. There is a hepatic dome irregular peripherally enhancing lesion which measures 5.0 x 4.8 cm on image 15/ series 2. This is new since the prior exam. Portal veins patent. Normal spleen, stomach, pancreas.  Mild motion degradation. Normal gallbladder, biliary tract, right adrenal gland. Mild left adrenal thickening and nodularity is chronic.  Scarring in the lower pole right kidney with a dominant 8.2 cm inter/lower pole right renal cyst. Left renal cyst and bilateral too small to characterize renal lesions. A lower pole left renal lesion measures 8 mm and greater than fluid density on image 54/ series 2. This is at the site of a larger lower density lesion on the prior exam.  Punctate lower pole left renal collecting system calculus. No hydronephrosis or hydroureter.  Aortic and branch vessel atherosclerosis. Infrarenal abdominal aortic aneurysm at maximally 4.3 cm today. 3.5 cm at the same level on the prior. A component of chronic dissection is identified in this area.  More focal wall saccular aneurysm is seen just above the aortic bifurcation at 2.5 cm on image 64. This is increased from 1.1 cm at the same level on the prior. No surrounding hemorrhage or adenopathy in the retroperitoneum.  Degraded evaluation of the pelvis, secondary to right hip arthroplasty and resultant beam hardening artifact.  Colonic stool burden suggests constipation. Normal terminal ileum and appendix. Normal small bowel without abdominal ascites.  Atherosclerosis but no pelvic aneurysm. No pelvic adenopathy. Fat containing right inguinal hernia.  Normal prostate. Foci of increased density within the dependent urinary bladder on image 89 are suspicious for small stones. No significant free fluid. Hyperattenuation in the penile base on image 106/series 2 is not calcified and is favored to be vascular.  Bones/Musculoskeletal: Right hip arthroplasty. Osteopenia. Moderate L2 and  mild L4 compression deformities.  IMPRESSION: 1. Hyperattenuation in the dependent bladder is favored to represent small stones. Early contrast excretion felt less likely. 2. Motion and hardware degraded exam. 3. Left nephrolithiasis. 4. Irregular hepatic contour, suspicious for cirrhosis. New hepatic dome lesion which demonstrates peripheral enhancement. Although this could represent a hemangioma, given cirrhosis, hepatocellular carcinoma is a concern. Nonemergent outpatient pre and post contrast abdominal MRI should be considered. 5. Progression of abdominal aortic aneurysm with saccular and fusiform components. No surrounding hemorrhage. 6.  Possible constipation. 7. Hyperattenuation at the penile base is favored to be vascular. Partially calcified urethral stone is felt less likely. 8. Left renal lesion which is not a simple cyst. Recommend attention on followup MRI.   Electronically Signed   By: Jeronimo GreavesKyle  Talbot M.D.   On: 06/13/2013 17:25    Medications: Scheduled Meds: . amLODipine  10 mg Oral Daily  . cefTRIAXone (ROCEPHIN)  IV  1 g Intravenous Q24H  . clopidogrel  75 mg Oral Daily  . darifenacin  7.5 mg Oral Daily  .  docusate sodium  100 mg Oral BID  . dutasteride  0.5 mg Oral q morning - 10a  . enoxaparin (LOVENOX) injection  40 mg Subcutaneous Q24H  . polyethylene glycol  17 g Oral Daily  . senna  1 tablet Oral Daily  . tamsulosin  0.4 mg Oral Daily      LOS: 2 days   Jelina Paulsen M.D. [facility] 06/15/2013, 12:46 PM Pager: [phone]  If 7PM-7AM, please contact night-coverage [email or web address] Password [email or web address]

## 2013-06-16 ENCOUNTER — Encounter (HOSPITAL_COMMUNITY): Payer: Self-pay | Admitting: General Practice

## 2013-06-16 MED ORDER — FLAVOXATE HCL 100 MG PO TABS
100.0000 mg | ORAL_TABLET | Freq: Three times a day (TID) | ORAL | Status: DC
  Administered 2013-06-16 – 2013-06-18 (×6): 100 mg via ORAL
  Filled 2013-06-16 (×9): qty 1

## 2013-06-16 MED ORDER — PHENAZOPYRIDINE HCL 100 MG PO TABS: 100.0000 mg | ORAL_TABLET | Freq: Three times a day (TID) | ORAL | Status: DC

## 2013-06-16 NOTE — Progress Notes (Signed)
Patient ID: [name] [name]  male  ZOX:[id number]    DOB: 12-May-1935    DOA: 06/13/2013  PCP: Rivka Spring, MD  Assessment/Plan: Active Problems:   UTI (urinary tract infection) with  Urinary retention - Urine culture with 70,000 colonies, continue IV Rocephin - Placed on Urispas, discussed with urology, Dr. Laverle Patter, who recommended voiding trial, DC Foley today. Check PVR  - Continue IV antibiotics, CT abdomen and pelvis reviewed, no hydronephrosis or acute obstruction  Hypertension: - Currently stable  Hepatic lesion and left renal lesion - Outpatient MRI  Constipation -Resolved   BPH, continue Avodart, Flomax  DVT Prophylaxis:  Code Status:  Family Communication: Discussed in detail with patient's wife  Disposition OT evaluation done today, recommended skilled nursing facility    Subjective: Feels better today, wife at the bedside, afebrile no acute events overnight  Objective: Weight change: 3.291 kg (7 lb 4.1 oz)  Intake/Output Summary (Last 24 hours) at 06/16/13 1426 Last data filed at 06/16/13 1425  Gross per 24 hour  Intake   1140 ml  Output   4700 ml  Net  -3560 ml   Blood pressure 157/57, pulse 60, temperature 98 F (36.7 C), temperature source Oral, resp. rate 17, weight 81.4 kg (179 lb 7.3 oz), SpO2 95.00%.  Physical Exam: General: Alert and awake, not in any acute distress. CVS: S1-S2 clear, no murmur rubs or gallops Chest: clear to auscultation bilaterally, no wheezing, rales or rhonchi Abdomen: soft NT, ND, NBS Extremities: no cyanosis, clubbing or edema noted bilaterally   Lab Results: Basic Metabolic Panel:  Recent Labs Lab 06/13/13 1228 06/14/13 0455  NA 141 140  K 3.8 3.6*  CL 103 103  CO2 25 25  GLUCOSE 140* 100*  BUN 14 14  CREATININE 0.79 0.74  CALCIUM 9.1 8.9   Liver Function Tests:  Recent Labs Lab 06/13/13 1228  AST 19  ALT 26  ALKPHOS 86  BILITOT 0.7  PROT 7.5  ALBUMIN 3.6   No results found for this basename:  LIPASE, AMYLASE,  in the last 168 hours No results found for this basename: AMMONIA,  in the last 168 hours CBC:  Recent Labs Lab 06/13/13 1228 06/14/13 0455  WBC 19.1* 13.3*  NEUTROABS 15.8*  --   HGB 16.2 14.4  HCT 46.2 42.7  MCV 90.6 91.8  PLT 221 171   Cardiac Enzymes: No results found for this basename: CKTOTAL, CKMB, CKMBINDEX, TROPONINI,  in the last 168 hours BNP: No components found with this basename: POCBNP,  CBG: No results found for this basename: GLUCAP,  in the last 168 hours   Micro Results: Recent Results (from the past 240 hour(s))  URINE CULTURE     Status: None   Collection Time    06/13/13 12:28 PM      Result Value Range Status   Specimen Description URINE, CLEAN CATCH   Final   Special Requests NONE   Final   Culture  Setup Time     Final   Value: 06/14/2013 01:32     Performed at [facility] Count     Final   Value: 70,000 COLONIES/ML     Performed at [facility]   Culture     Final   Value: Multiple bacterial morphotypes present, none predominant. Suggest appropriate recollection if clinically indicated.     Performed at [facility]   Report Status 06/14/2013 FINAL   Final    Studies/Results: Ct Abdomen Pelvis  W Contrast  06/13/2013   CLINICAL DATA:  Pain at the penis and testicle while urinating. History of frequent urinary tract infections. Abdominal aortic aneurysm. Prior liver biopsy.  EXAM: CT ABDOMEN AND PELVIS WITH CONTRAST  TECHNIQUE: Multidetector CT imaging of the abdomen and pelvis was performed using the standard protocol following bolus administration of intravenous contrast.  CONTRAST:  100mL OMNIPAQUE IOHEXOL 300 MG/ML  SOLN  COMPARISON:  CT ABD W/CM dated 08/07/2004; CT ABD W/CM dated 01/14/2003  FINDINGS: Lower Chest: Mild bibasilar volume loss. Mild cardiomegaly with atherosclerosis in the coronary arteries. No pericardial or pleural effusion.  Abdomen/Pelvis: Irregular hepatic contour, most  consistent with cirrhosis. Underline probable hepatic steatosis. Heterogeneous enhancement in the left lobe of the liver on image 22/series 2 could represent heterogeneous steatosis or altered perfusion. There is a hepatic dome irregular peripherally enhancing lesion which measures 5.0 x 4.8 cm on image 15/ series 2. This is new since the prior exam. Portal veins patent. Normal spleen, stomach, pancreas.  Mild motion degradation. Normal gallbladder, biliary tract, right adrenal gland. Mild left adrenal thickening and nodularity is chronic.  Scarring in the lower pole right kidney with a dominant 8.2 cm inter/lower pole right renal cyst. Left renal cyst and bilateral too small to characterize renal lesions. A lower pole left renal lesion measures 8 mm and greater than fluid density on image 54/ series 2. This is at the site of a larger lower density lesion on the prior exam.  Punctate lower pole left renal collecting system calculus. No hydronephrosis or hydroureter.  Aortic and branch vessel atherosclerosis. Infrarenal abdominal aortic aneurysm at maximally 4.3 cm today. 3.5 cm at the same level on the prior. A component of chronic dissection is identified in this area.  More focal wall saccular aneurysm is seen just above the aortic bifurcation at 2.5 cm on image 64. This is increased from 1.1 cm at the same level on the prior. No surrounding hemorrhage or adenopathy in the retroperitoneum.  Degraded evaluation of the pelvis, secondary to right hip arthroplasty and resultant beam hardening artifact.  Colonic stool burden suggests constipation. Normal terminal ileum and appendix. Normal small bowel without abdominal ascites.  Atherosclerosis but no pelvic aneurysm. No pelvic adenopathy. Fat containing right inguinal hernia.  Normal prostate. Foci of increased density within the dependent urinary bladder on image 89 are suspicious for small stones. No significant free fluid. Hyperattenuation in the penile base on  image 106/series 2 is not calcified and is favored to be vascular.  Bones/Musculoskeletal: Right hip arthroplasty. Osteopenia. Moderate L2 and mild L4 compression deformities.  IMPRESSION: 1. Hyperattenuation in the dependent bladder is favored to represent small stones. Early contrast excretion felt less likely. 2. Motion and hardware degraded exam. 3. Left nephrolithiasis. 4. Irregular hepatic contour, suspicious for cirrhosis. New hepatic dome lesion which demonstrates peripheral enhancement. Although this could represent a hemangioma, given cirrhosis, hepatocellular carcinoma is a concern. Nonemergent outpatient pre and post contrast abdominal MRI should be considered. 5. Progression of abdominal aortic aneurysm with saccular and fusiform components. No surrounding hemorrhage. 6.  Possible constipation. 7. Hyperattenuation at the penile base is favored to be vascular. Partially calcified urethral stone is felt less likely. 8. Left renal lesion which is not a simple cyst. Recommend attention on followup MRI.   Electronically Signed   By: Jeronimo GreavesKyle  Talbot M.D.   On: 06/13/2013 17:25    Medications: Scheduled Meds: . amLODipine  10 mg Oral Daily  . cefTRIAXone (ROCEPHIN)  IV  1  g Intravenous Q24H  . clopidogrel  75 mg Oral Daily  . darifenacin  7.5 mg Oral Daily  . docusate sodium  100 mg Oral BID  . dutasteride  0.5 mg Oral q morning - 10a  . enoxaparin (LOVENOX) injection  40 mg Subcutaneous Q24H  . flavoxATE  100 mg Oral TID  . polyethylene glycol  17 g Oral Daily  . senna  1 tablet Oral Daily  . tamsulosin  0.4 mg Oral Daily      LOS: 3 days   Homero Hyson M.D. [facility] 06/16/2013, 2:26 PM Pager: [phone]  If 7PM-7AM, please contact night-coverage [email or web address] Password [email or web address]

## 2013-06-16 NOTE — Progress Notes (Signed)
Physical Therapy Treatment Patient Details Name: [name] [name] MRN: [id number] DOB: 04/22/1935 Today's Date: 06/16/2013 Time: [id number] PT Time Calculation (min): 20 min  PT Assessment / Plan / Recommendation  History of Present Illness Pt is a 79 y/o male admitted with UTI and severe abdominal pain as a result. PMH is significant for CVA x2 (1995, 2006) and pt demonstrating expressive aphasia. Wife reports she provides significant amount of assist for him at home with ADL's and transfers, and pt does not walk.    PT Comments   Pt able to transfer to EOB with +2 assist today. Pain continues to limit participation, however improvement was seen. Therapist answered many questions regarding d/c to SNF, and wife states she is unable to care for him at home at this point. D/C recommendation updated to SNF this session.   Follow Up Recommendations  SNF     Does the patient have the potential to tolerate intense rehabilitation     Barriers to Discharge        Equipment Recommendations  None recommended by PT    Recommendations for Other Services    Frequency Min 3X/week   Progress towards PT Goals Progress towards PT goals: Progressing toward goals  Plan Discharge plan needs to be updated    Precautions / Restrictions Precautions Precautions: Fall Restrictions Weight Bearing Restrictions: No   Pertinent Vitals/Pain Pt reports sudden pain that comes from spasms or cramping in lower abdominal/pelvic region. Pt repositioned for comfort.     Mobility  Pt was able to transfer to EOB today, with +2 assist. Pt's wife was asked not to assist for safety, and therapist and tech were able to assist pt to seated position.  Ambulation/Gait General Gait Details: Pt's wife reports he does not walk at baseline.     Exercises     PT Diagnosis:    PT Problem List:   PT Treatment Interventions:     PT Goals (current goals can now be found in the care plan section) Acute Rehab PT Goals Patient  Stated Goal: Pt unable to state goals, however wife would like him to return home.  PT Goal Formulation: With patient/family Time For Goal Achievement: 06/29/13 Potential to Achieve Goals: Fair  Visit Information  Last PT Received On: 06/16/13 Assistance Needed: +2 History of Present Illness: Pt is a 79 y/o male admitted with UTI and severe abdominal pain as a result. PMH is significant for CVA x2 (1995, 2006) and pt demonstrating expressive aphasia. Wife reports she provides significant amount of assist for him at home with ADL's and transfers, and pt does not walk.     Subjective Data  Subjective: Pt agrees to mobilize, however does not verbalize much during session.  Patient Stated Goal: Pt unable to state goals, however wife would like him to return home.    Cognition  Cognition Arousal/Alertness: Awake/alert Behavior During Therapy: WFL for tasks assessed/performed Overall Cognitive Status: Within Functional Limits for tasks assessed    Balance     End of Session PT - End of Session Equipment Utilized During Treatment: Gait belt Activity Tolerance: Patient limited by pain Patient left: in bed;with call bell/phone within reach;with family/visitor present Nurse Communication: Mobility status   GP     [name] [name], [name] [name] 06/16/2013, 1:05 PM  [name] [name], PT, DPT [phone]

## 2013-06-16 NOTE — ED Provider Notes (Signed)
Medical screening examination/treatment/procedure(s) were conducted as a shared visit with non-physician practitioner(s) and myself.  I personally evaluated the patient during the encounter.  EKG Interpretation    Date/Time:    Ventricular Rate:    PR Interval:    QRS Duration:   QT Interval:    QTC Calculation:   R Axis:     Text Interpretation:               Doug SouSam Cedra Villalon, MD 06/16/13 1417

## 2013-06-17 MED ORDER — DSS 100 MG PO CAPS: 100.0000 mg | ORAL_CAPSULE | Freq: Two times a day (BID) | ORAL | Status: DC

## 2013-06-17 MED ORDER — CEPHALEXIN 500 MG PO CAPS: 500.0000 mg | ORAL_CAPSULE | Freq: Two times a day (BID) | ORAL | Status: DC

## 2013-06-17 MED ORDER — PSYLLIUM 0.52 G PO CAPS: 0.5200 g | ORAL_CAPSULE | Freq: Every day | ORAL | Status: DC

## 2013-06-17 MED ORDER — HYDROCODONE-ACETAMINOPHEN 5-325 MG PO TABS: 1.0000 | ORAL_TABLET | ORAL | Status: DC | PRN

## 2013-06-17 MED ORDER — FLEET PEDIATRIC 3.5-9.5 GM/59ML RE ENEM
1.0000 | ENEMA | Freq: Every day | RECTAL | Status: DC | PRN
Start: 2013-06-17 — End: 2013-07-16

## 2013-06-17 MED ORDER — AMLODIPINE BESYLATE 10 MG PO TABS: 10.0000 mg | ORAL_TABLET | Freq: Every day | ORAL | Status: DC

## 2013-06-17 MED ORDER — FLAVOXATE HCL 100 MG PO TABS: 100.0000 mg | ORAL_TABLET | Freq: Three times a day (TID) | ORAL | Status: DC | PRN

## 2013-06-17 MED ORDER — SENNA 8.6 MG PO TABS: 1.0000 | ORAL_TABLET | Freq: Every day | ORAL | Status: DC

## 2013-06-17 NOTE — Clinical Social Work Placement (Addendum)
Clinical Social Work Department  CLINICAL SOCIAL WORK PLACEMENT NOTE  Patient: [name] [name] Account Number: [id number] Admit date: 06/13/12  Clinical Social Worker: Sabino NiemannAmy Sherrie Marsan LCSWA Date/time: 06/17/2013 11:30 AM  Clinical Social Work is seeking post-discharge placement for this patient at the following level of care: SKILLED NURSING (*CSW will update this form in Epic as items are completed)  06/17/2013 Patient/family provided with [facility] Department of Clinical Social Work's list of facilities offering this level of care within the geographic area requested by the patient (or if unable, by the patient's family).  06/17/2013 Patient/family informed of their freedom to choose among providers that offer the needed level of care, that participate in Medicare, Medicaid or managed care program needed by the patient, have an available bed and are willing to accept the patient.  1/8/2015Patient/family informed of [facility]' ownership interest in [facility], as well as of the fact that they are under no obligation to receive care at this facility.  PASARR submitted to EDS on Pre-existing PASARR number received from EDS on  FL2 transmitted to all facilities in geographic area requested by pt/family on 06/17/2013  FL2 transmitted to all facilities within larger geographic area on  Patient informed that his/her managed care company has contracts with or will negotiate with certain facilities, including the following:  Patient/family informed of bed offers received:  [facility] Physician recommends and patient chooses bed at  Patient to be transferred to on 06/18/13 Patient [facility] The following physician request were entered in Epic:  Additional Comments:

## 2013-06-17 NOTE — Progress Notes (Signed)
Patient ID: [name] [name]  male  GNF:[id number]    DOB: 04/21/1935    DOA: 06/13/2013  PCP: Rivka SpringOHEN, PAUL S, MD  Assessment/Plan: Active Problems:   UTI (urinary tract infection) with  Urinary retention - Urine culture with 70,000 colonies, continue IV Rocephin - Foley discontinued, patient has been voiding successfully by himself  - Continue IV antibiotics, CT abdomen and pelvis reviewed, no hydronephrosis or acute obstruction  Hypertension: - Currently stable  Hepatic lesion and left renal lesion Patient was recommended outpatient MRI of the abdomen and pelvis. Per his wife, patient already has established Careers advisersurgeon and oncologist. Per his wife, he had MRI abdomen, liver biopsy done last year and was negative, he was seen by outpatient oncologist in [place] and was just recommended to followup periodically in 3-4 months. I also explained her about the left renal lesion and she will followup with their oncologist in [place]. I I recommend patient have an MRI of the abdomen/pelvis outpatient if he is going to stay in [place] for a while, will notify his PCP, Dr. Bayard HuggerLeshber here.  Constipation -Resolved   BPH, continue Avodart, Flomax  DVT Prophylaxis:  Code Status:  Family Communication: Discussed in detail with patient's wife  Disposition  skilled nursing facility when a bed available    Subjective:. pain in the right knee, per his wife, chronic Feels better today,voiding by himself, no abdominal pain Objective: Weight change: -2.5 kg (-5 lb 8.2 oz)  Intake/Output Summary (Last 24 hours) at 06/17/13 1306 Last data filed at 06/17/13 0900  Gross per 24 hour  Intake   1170 ml  Output   2729 ml  Net  -1559 ml   Blood pressure 132/72, pulse 62, temperature 97.8 F (36.6 C), temperature source Oral, resp. rate 18, weight 78.9 kg (173 lb 15.1 oz), SpO2 98.00%.  Physical Exam: General: Alert and awake, not in any acute distress. CVS: S1-S2 clear, no murmur rubs or  gallops Chest: clear to auscultation bilaterally, no wheezing, rales or rhonchi Abdomen: soft NT, ND, NBS Extremities: no cyanosis, clubbing or edema noted bilaterally   Lab Results: Basic Metabolic Panel:  Recent Labs Lab 06/13/13 1228 06/14/13 0455  NA 141 140  K 3.8 3.6*  CL 103 103  CO2 25 25  GLUCOSE 140* 100*  BUN 14 14  CREATININE 0.79 0.74  CALCIUM 9.1 8.9   Liver Function Tests:  Recent Labs Lab 06/13/13 1228  AST 19  ALT 26  ALKPHOS 86  BILITOT 0.7  PROT 7.5  ALBUMIN 3.6   No results found for this basename: LIPASE, AMYLASE,  in the last 168 hours No results found for this basename: AMMONIA,  in the last 168 hours CBC:  Recent Labs Lab 06/13/13 1228 06/14/13 0455  WBC 19.1* 13.3*  NEUTROABS 15.8*  --   HGB 16.2 14.4  HCT 46.2 42.7  MCV 90.6 91.8  PLT 221 171   Cardiac Enzymes: No results found for this basename: CKTOTAL, CKMB, CKMBINDEX, TROPONINI,  in the last 168 hours BNP: No components found with this basename: POCBNP,  CBG: No results found for this basename: GLUCAP,  in the last 168 hours   Micro Results: Recent Results (from the past 240 hour(s))  URINE CULTURE     Status: None   Collection Time    06/13/13 12:28 PM      Result Value Range Status   Specimen Description URINE, CLEAN CATCH   Final   Special Requests NONE   Final  Culture  Setup Time     Final   Value: 06/14/2013 01:32     Performed at [facility] Count     Final   Value: 70,000 COLONIES/ML     Performed at [facility]   Culture     Final   Value: Multiple bacterial morphotypes present, none predominant. Suggest appropriate recollection if clinically indicated.     Performed at [facility]   Report Status 06/14/2013 FINAL   Final    Studies/Results: Ct Abdomen Pelvis W Contrast  06/13/2013   CLINICAL DATA:  Pain at the penis and testicle while urinating. History of frequent urinary tract infections. Abdominal aortic  aneurysm. Prior liver biopsy.  EXAM: CT ABDOMEN AND PELVIS WITH CONTRAST  TECHNIQUE: Multidetector CT imaging of the abdomen and pelvis was performed using the standard protocol following bolus administration of intravenous contrast.  CONTRAST:  OMNIPAQUE IOHEXOL 300 MG/ML  SOLN  COMPARISON:  CT ABD W/CM dated 08/07/2004; CT ABD W/CM dated 01/14/2003  FINDINGS: Lower Chest: Mild bibasilar volume loss. Mild cardiomegaly with atherosclerosis in the coronary arteries. No pericardial or pleural effusion.  Abdomen/Pelvis: Irregular hepatic contour, most consistent with cirrhosis. Underline probable hepatic steatosis. Heterogeneous enhancement in the left lobe of the liver on image 22/series 2 could represent heterogeneous steatosis or altered perfusion. There is a hepatic dome irregular peripherally enhancing lesion which measures 5.0 x 4.8 cm on image 15/ series 2. This is new since the prior exam. Portal veins patent. Normal spleen, stomach, pancreas.  Mild motion degradation. Normal gallbladder, biliary tract, right adrenal gland. Mild left adrenal thickening and nodularity is chronic.  Scarring in the lower pole right kidney with a dominant 8.2 cm inter/lower pole right renal cyst. Left renal cyst and bilateral too small to characterize renal lesions. A lower pole left renal lesion measures 8 mm and greater than fluid density on image 54/ series 2. This is at the site of a larger lower density lesion on the prior exam.  Punctate lower pole left renal collecting system calculus. No hydronephrosis or hydroureter.  Aortic and branch vessel atherosclerosis. Infrarenal abdominal aortic aneurysm at maximally 4.3 cm today. 3.5 cm at the same level on the prior. A component of chronic dissection is identified in this area.  More focal wall saccular aneurysm is seen just above the aortic bifurcation at 2.5 cm on image 64. This is increased from 1.1 cm at the same level on the prior. No surrounding hemorrhage or adenopathy  in the retroperitoneum.  Degraded evaluation of the pelvis, secondary to right hip arthroplasty and resultant beam hardening artifact.  Colonic stool burden suggests constipation. Normal terminal ileum and appendix. Normal small bowel without abdominal ascites.  Atherosclerosis but no pelvic aneurysm. No pelvic adenopathy. Fat containing right inguinal hernia.  Normal prostate. Foci of increased density within the dependent urinary bladder on image 89 are suspicious for small stones. No significant free fluid. Hyperattenuation in the penile base on image 106/series 2 is not calcified and is favored to be vascular.  Bones/Musculoskeletal: Right hip arthroplasty. Osteopenia. Moderate L2 and mild L4 compression deformities.  IMPRESSION: 1. Hyperattenuation in the dependent bladder is favored to represent small stones. Early contrast excretion felt less likely. 2. Motion and hardware degraded exam. 3. Left nephrolithiasis. 4. Irregular hepatic contour, suspicious for cirrhosis. New hepatic dome lesion which demonstrates peripheral enhancement. Although this could represent a hemangioma, given cirrhosis, hepatocellular carcinoma is a concern. Nonemergent outpatient pre and post contrast  abdominal MRI should be considered. 5. Progression of abdominal aortic aneurysm with saccular and fusiform components. No surrounding hemorrhage. 6.  Possible constipation. 7. Hyperattenuation at the penile base is favored to be vascular. Partially calcified urethral stone is felt less likely. 8. Left renal lesion which is not a simple cyst. Recommend attention on followup MRI.   Electronically Signed   By: Jeronimo Greaves M.D.   On: 06/13/2013 17:25    Medications: Scheduled Meds: . amLODipine  10 mg Oral Daily  . cefTRIAXone (ROCEPHIN)  IV  1 g Intravenous Q24H  . clopidogrel  75 mg Oral Daily  . darifenacin  7.5 mg Oral Daily  . docusate sodium  100 mg Oral BID  . dutasteride  0.5 mg Oral q morning - 10a  . enoxaparin (LOVENOX)  injection  40 mg Subcutaneous Q24H  . flavoxATE  100 mg Oral TID  . polyethylene glycol  17 g Oral Daily  . senna  1 tablet Oral Daily  . tamsulosin  0.4 mg Oral Daily      LOS: 4 days   Devera Englander M.D. [facility] 06/17/2013, 1:06 PM Pager: [phone]  If 7PM-7AM, please contact night-coverage [email or web address] Password [email or web address]

## 2013-06-17 NOTE — Discharge Summary (Signed)
Physician Discharge Summary  Patient ID: [name] [name] MRN: [id number] DOB/AGE: April 04, 1935 79 y.o.  Admit date: 06/13/2013 Discharge date: 06/18/2013  Primary Care Physician:  Rivka Spring, MD  Discharge Diagnoses:    . UTI (urinary tract infection) . Urinary retention resolved Hypertension Hepatic lesion Left renal lesion Constipation BPH  Consults: Dr. Laverle Patter via phone consultation   Recommendations for Outpatient Follow-up:  Patient is recommended to have outpatient MRI abdomen and pelvis for the left renal lesion, which does not appear to be typical cyst.  Allergies:   Allergies  Allergen Reactions  . Oxycontin [Oxycodone Hcl] Other (See Comments)    Severe confusion     Discharge Medications:   Medication List    STOP taking these medications       traMADol 50 MG tablet  Commonly known as:  ULTRAM      TAKE these medications       amLODipine 10 MG tablet  Commonly known as:  NORVASC  Take 1 tablet (10 mg total) by mouth daily.     CALCIUM 600 + MINERALS PO  Take 1 tablet by mouth 2 (two) times daily.     cephALEXin 500 MG capsule  Commonly known as:  KEFLEX  Take 1 capsule (500 mg total) by mouth 2 (two) times daily. X 4 more days     clopidogrel 75 MG tablet  Commonly known as:  PLAVIX  Take 1 tablet (75 mg total) by mouth daily.     DSS 100 MG Caps  Take 100 mg by mouth 2 (two) times daily.     dutasteride 0.5 MG capsule  Commonly known as:  AVODART  Take 0.5 mg by mouth every morning.     flavoxATE 100 MG tablet  Commonly known as:  URISPAS  Take 1 tablet (100 mg total) by mouth 3 (three) times daily as needed for bladder spasms.     HYDROcodone-acetaminophen 5-325 MG per tablet  Commonly known as:  NORCO/VICODIN  Take 1-2 tablets by mouth every 4 (four) hours as needed for moderate pain or severe pain.     MACULAR VITAMIN BENEFIT Tabs  Take 1 tablet by mouth daily.     multivitamin with minerals Tabs tablet  Take 1 tablet by  mouth daily.     polyethylene glycol packet  Commonly known as:  MIRALAX / GLYCOLAX  Take 17 g by mouth daily.     psyllium 0.52 G capsule  Commonly known as:  METAMUCIL  Take 1 capsule (0.52 g total) by mouth daily.     senna 8.6 MG Tabs tablet  Commonly known as:  SENOKOT  Take 1 tablet (8.6 mg total) by mouth daily. For constipation     sodium phosphate Pediatric 3.5-9.5 GM/59ML enema  Place 66 mLs (1 enema total) rectally daily as needed for severe constipation.     solifenacin 5 MG tablet  Commonly known as:  VESICARE  Take 5 mg by mouth every morning.     tamsulosin 0.4 MG Caps capsule  Commonly known as:  FLOMAX  Take 0.4 mg by mouth.         Brief H and P: For complete details please refer to admission H and P, but in brief84 y.o. male with a PMH of CAD, angina, HTN, stroke w/ residual right sided weakness and expressive aphasia, anemia, cluster headaches, arthritis, abdominal aneurysm, peripheral vertigo, and urinary incontinence who presented to [facility] ED with main concern of sudden onset of urinary retention associated with  lower abdominal quadrants pain, dull and constant, 6/10 in severity and with no specific alleviating factors. Pt also reports associated dysuria, diffuse genital pain involving penis, testicles, scrotum. Pt denied fevers, chills, no change in appetite, no specific systemic concerns. Patient's PCP is in [place]. He has a neurologist and a vascular specialist who monitors his AAA but does not have an established PCP here in [place].  In ED, pt was hemodynamically stable. Ct abdomen and pelvis with multiple findings including left nephrolithiasis, progressive AAA, ? Liver cirrhosis and worrisome for HCC. Bladder scan done and with 276 cc PVR. UA suggestive of UTI. Pt started on Rocephin IV. Patient was admitted for further workup  Hospital Course:   UTI (urinary tract infection) with Urinary retention and dysuria: Patient was admitted for further  workup. Urinary attention could be possibly due to UTI, BPH. Patient had coude catheter placed on admission. Urine culture with 70,000 colonies, he was continued on IV antibiotics. Patient was placed on Urispas for bladder spasms. I also discussed with urology, Dr. Laverle Patter, who recommended voiding trial, DC Foley today. Patient had a catheter removed on 06/16/2013 and had  successfully passed urine spontaneously. CT abdomen and pelvis did not show any hydronephrosis or acute obstruction. Followup appointment with Dr. Laverle Patter scheduled on 2/ 4/15.   Hypertension:  - Currently stable   Hepatic lesion and left renal lesion  Patient was recommended outpatient MRI of the abdomen and pelvis. Per his wife, patient already has established Careers adviser and oncologist. Per his wife, he had MRI abdomen, liver biopsy done last year and was negative, he was seen by outpatient oncologist in [place] and was just recommended to followup periodically in 3-4 months. I also explained her about the left renal lesion and she will followup with their oncologist in [place]. I recommend patient have an MRI of the abdomen/pelvis outpatient if he is going to stay in [place] for a while, will notify his PCP, Dr. Bayard Hugger here.  Constipation  -Resolved , Patient needs to be on good bowel regimen and Fleet Enema as needed  BPH, continue Avodart, Flomax  Day of Discharge BP 175/81  Pulse 68  Temp(Src) 97.6 F (36.4 C) (Oral)  Resp 17  Wt 79.4 kg (175 lb 0.7 oz)  SpO2 99%  Physical Exam: General: Alert and awake oriented, not in any acute distress. CVS: S1-S2 clear no murmur rubs or gallops Chest: clear to auscultation bilaterally, no wheezing rales or rhonchi Abdomen: soft nontender, nondistended, normal bowel sounds Extremities: no cyanosis, clubbing or edema noted bilaterally    The results of significant diagnostics from this hospitalization (including imaging, microbiology, ancillary and laboratory)  are listed below for reference.    LAB RESULTS: Basic Metabolic Panel:  Recent Labs Lab 06/13/13 1228 06/14/13 0455  NA 141 140  K 3.8 3.6*  CL 103 103  CO2 25 25  GLUCOSE 140* 100*  BUN 14 14  CREATININE 0.79 0.74  CALCIUM 9.1 8.9   Liver Function Tests:  Recent Labs Lab 06/13/13 1228  AST 19  ALT 26  ALKPHOS 86  BILITOT 0.7  PROT 7.5  ALBUMIN 3.6   No results found for this basename: LIPASE, AMYLASE,  in the last 168 hours No results found for this basename: AMMONIA,  in the last 168 hours CBC:  Recent Labs Lab 06/13/13 1228 06/14/13 0455  WBC 19.1* 13.3*  NEUTROABS 15.8*  --   HGB 16.2 14.4  HCT 46.2 42.7  MCV 90.6 91.8  PLT  221 171   Cardiac Enzymes: No results found for this basename: CKTOTAL, CKMB, CKMBINDEX, TROPONINI,  in the last 168 hours BNP: No components found with this basename: POCBNP,  CBG: No results found for this basename: GLUCAP,  in the last 168 hours  Significant Diagnostic Studies:  Ct Abdomen Pelvis W Contrast  06/13/2013   CLINICAL DATA:  Pain at the penis and testicle while urinating. History of frequent urinary tract infections. Abdominal aortic aneurysm. Prior liver biopsy.  EXAM: CT ABDOMEN AND PELVIS WITH CONTRAST  TECHNIQUE: Multidetector CT imaging of the abdomen and pelvis was performed using the standard protocol following bolus administration of intravenous contrast.  CONTRAST:  100mL OMNIPAQUE IOHEXOL 300 MG/ML  SOLN  COMPARISON:  CT ABD W/CM dated 08/07/2004; CT ABD W/CM dated 01/14/2003  FINDINGS: Lower Chest: Mild bibasilar volume loss. Mild cardiomegaly with atherosclerosis in the coronary arteries. No pericardial or pleural effusion.  Abdomen/Pelvis: Irregular hepatic contour, most consistent with cirrhosis. Underline probable hepatic steatosis. Heterogeneous enhancement in the left lobe of the liver on image 22/series 2 could represent heterogeneous steatosis or altered perfusion. There is a hepatic dome irregular  peripherally enhancing lesion which measures 5.0 x 4.8 cm on image 15/ series 2. This is new since the prior exam. Portal veins patent. Normal spleen, stomach, pancreas.  Mild motion degradation. Normal gallbladder, biliary tract, right adrenal gland. Mild left adrenal thickening and nodularity is chronic.  Scarring in the lower pole right kidney with a dominant 8.2 cm inter/lower pole right renal cyst. Left renal cyst and bilateral too small to characterize renal lesions. A lower pole left renal lesion measures 8 mm and greater than fluid density on image 54/ series 2. This is at the site of a larger lower density lesion on the prior exam.  Punctate lower pole left renal collecting system calculus. No hydronephrosis or hydroureter.  Aortic and branch vessel atherosclerosis. Infrarenal abdominal aortic aneurysm at maximally 4.3 cm today. 3.5 cm at the same level on the prior. A component of chronic dissection is identified in this area.  More focal wall saccular aneurysm is seen just above the aortic bifurcation at 2.5 cm on image 64. This is increased from 1.1 cm at the same level on the prior. No surrounding hemorrhage or adenopathy in the retroperitoneum.  Degraded evaluation of the pelvis, secondary to right hip arthroplasty and resultant beam hardening artifact.  Colonic stool burden suggests constipation. Normal terminal ileum and appendix. Normal small bowel without abdominal ascites.  Atherosclerosis but no pelvic aneurysm. No pelvic adenopathy. Fat containing right inguinal hernia.  Normal prostate. Foci of increased density within the dependent urinary bladder on image 89 are suspicious for small stones. No significant free fluid. Hyperattenuation in the penile base on image 106/series 2 is not calcified and is favored to be vascular.  Bones/Musculoskeletal: Right hip arthroplasty. Osteopenia. Moderate L2 and mild L4 compression deformities.  IMPRESSION: 1. Hyperattenuation in the dependent bladder is  favored to represent small stones. Early contrast excretion felt less likely. 2. Motion and hardware degraded exam. 3. Left nephrolithiasis. 4. Irregular hepatic contour, suspicious for cirrhosis. New hepatic dome lesion which demonstrates peripheral enhancement. Although this could represent a hemangioma, given cirrhosis, hepatocellular carcinoma is a concern. Nonemergent outpatient pre and post contrast abdominal MRI should be considered. 5. Progression of abdominal aortic aneurysm with saccular and fusiform components. No surrounding hemorrhage. 6.  Possible constipation. 7. Hyperattenuation at the penile base is favored to be vascular. Partially calcified urethral stone is felt less  likely. 8. Left renal lesion which is not a simple cyst. Recommend attention on followup MRI.   Electronically Signed   By: Jeronimo Greaves M.D.   On: 06/13/2013 17:25      Disposition and Follow-up: Discharge Orders   Future Appointments Provider Department Dept Phone   07/21/2013 9:30 AM Newt Lukes, MD [facility] [phone]   12/28/2013 9:30 AM [facility] [place] [facility] [address] [phone]   Eat a light meal the night before the exam Nothing to eat or drink for at least 8 hours before exam No gum chewing, or smoking the morning of the exam. Please take your morning medications with small sips of water, especially blood pressure medication *Very Important* Please wear 2 piece clothing   12/28/2013 10:00 AM [facility] [place] [facility] [address] [phone]   12/28/2013 11:40 AM Carma Lair Nickel, NP [facility] [phone]   01/03/2014 2:30 PM Levert Feinstein, MD [facility] [phone]   Future Orders Complete By Expires   Diet - low sodium heart healthy  As directed    Increase activity slowly  As directed        DISPOSITION: Skilled nursing facility  DIET:Heart healthy diet  DISCHARGE  FOLLOW-UP Follow-up Information   Follow up with Crecencio Mc, MD On 07/14/2013. (at 11:30AM)    Specialty:  Urology   Contact information:   [address] [facility]  [place] [address] [phone]       Follow up with Rene Paci, MD On 07/21/2013. (at 9:30AM. please call if this appointment can be made earlier for hospital follow-up)    Specialty:  Internal Medicine   Contact information:   [address] [place] [address] [phone]       Time spent on Discharge: 45 minutes  Signed:   Dionel Archey M.D. [facility] 06/18/2013, 11:54 AM Pager: [phone]

## 2013-06-18 ENCOUNTER — Other Ambulatory Visit: Payer: Self-pay | Admitting: *Deleted

## 2013-06-18 DIAGNOSIS — I714 Abdominal aortic aneurysm, without rupture, unspecified: Secondary | ICD-10-CM

## 2013-06-18 DIAGNOSIS — N390 Urinary tract infection, site not specified: Secondary | ICD-10-CM

## 2013-06-18 DIAGNOSIS — I10 Essential (primary) hypertension: Secondary | ICD-10-CM

## 2013-06-18 DIAGNOSIS — R109 Unspecified abdominal pain: Secondary | ICD-10-CM

## 2013-06-18 MED ORDER — HYDROCODONE-ACETAMINOPHEN 5-325 MG PO TABS: ORAL_TABLET | ORAL | Status: DC

## 2013-06-18 NOTE — Progress Notes (Signed)
Physical Therapy Treatment Patient Details Name: [name] [name] MRN: [id number] DOB: [birth year] Today's Date: 06/18/2013 Time: 1030-1047 PT Time Calculation (min): 17 min  PT Assessment / Plan / Recommendation  History of Present Illness Pt is a 79 y/o male admitted with UTI and severe abdominal pain as a result. PMH is significant for CVA x2 (1995, 2006) and pt demonstrating expressive aphasia. Wife reports she provides significant amount of assist for him at home with ADL's and transfers, and pt does not walk.    PT Comments   Pt progressing towards physical therapy goals. Was able to transition to EOB with min assist, and transfer from bed to chair with +2 assist. Pt does not report pain throughout session in abdominal/pelvic region, however when asked about pain pt points to R thigh and knee.   Follow Up Recommendations  SNF     Does the patient have the potential to tolerate intense rehabilitation     Barriers to Discharge        Equipment Recommendations  None recommended by PT    Recommendations for Other Services    Frequency Min 2X/week   Progress towards PT Goals Progress towards PT goals: Progressing toward goals  Plan Frequency needs to be updated    Precautions / Restrictions Precautions Precautions: Fall Restrictions Weight Bearing Restrictions: No   Pertinent Vitals/Pain See above    Mobility  Bed Mobility Overal bed mobility: +2 for physical assistance General bed mobility comments: Pt able to transition to EOB with min assist for initiation of movement of LE's and trunk stability when coming to full sit.  Transfers Overall transfer level: Needs assistance Equipment used: None Transfers: Stand Pivot Transfers Stand pivot transfers: +2 physical assistance General transfer comment: VC's for sequencing and safety. Pt participated about 25% with stand pivot transfer.  Ambulation/Gait General Gait Details: NT    Exercises General Exercises - Lower  Extremity Quad Sets: 10 reps Long Arc Quad: 10 reps Hip ABduction/ADduction: 10 reps Straight Leg Raises: 10 reps   PT Diagnosis:    PT Problem List:   PT Treatment Interventions:     PT Goals (current goals can now be found in the care plan section) Acute Rehab PT Goals Patient Stated Goal: Pt unable to state goals, however wife would like him to return home.  PT Goal Formulation: With patient/family Time For Goal Achievement: 06/29/13 Potential to Achieve Goals: Fair  Visit Information  Last PT Received On: 06/18/13 Assistance Needed: +2 History of Present Illness: Pt is a 79 y/o male admitted with UTI and severe abdominal pain as a result. PMH is significant for CVA x2 (1995, 2006) and pt demonstrating expressive aphasia. Wife reports she provides significant amount of assist for him at home with ADL's and transfers, and pt does not walk.     Subjective Data  Subjective: Pt agrees to mobilize, however does not verbalize much during session.  Patient Stated Goal: Pt unable to state goals, however wife would like him to return home.    Cognition  Cognition Arousal/Alertness: Awake/alert Behavior During Therapy: WFL for tasks assessed/performed Overall Cognitive Status: Difficult to assess Difficult to assess due to: Impaired communication (Expressive aphasia)    Balance  Balance Overall balance assessment: Needs assistance Sitting-balance support: Feet supported;Bilateral upper extremity supported Sitting balance-Leahy Scale: Fair Postural control: Posterior lean  End of Session PT - End of Session Equipment Utilized During Treatment: Gait belt Activity Tolerance: Patient tolerated treatment well Patient left: in chair;with call bell/phone within reach;with  nursing/sitter in room Nurse Communication: Mobility status   GP     Ruthann CancerHamilton, Newt Levingston 06/18/2013, 2:00 PM  Ruthann CancerLaura Hamilton, PT, DPT [phone]

## 2013-06-24 ENCOUNTER — Non-Acute Institutional Stay (SKILLED_NURSING_FACILITY): Payer: Medicare Other | Admitting: Internal Medicine

## 2013-06-24 ENCOUNTER — Encounter: Payer: Self-pay | Admitting: Internal Medicine

## 2013-06-24 DIAGNOSIS — N401 Enlarged prostate with lower urinary tract symptoms: Secondary | ICD-10-CM

## 2013-06-24 DIAGNOSIS — R531 Weakness: Secondary | ICD-10-CM

## 2013-06-24 DIAGNOSIS — N390 Urinary tract infection, site not specified: Secondary | ICD-10-CM

## 2013-06-24 DIAGNOSIS — I10 Essential (primary) hypertension: Secondary | ICD-10-CM

## 2013-06-24 DIAGNOSIS — R5383 Other fatigue: Secondary | ICD-10-CM

## 2013-06-24 DIAGNOSIS — R338 Other retention of urine: Secondary | ICD-10-CM

## 2013-06-24 DIAGNOSIS — R339 Retention of urine, unspecified: Secondary | ICD-10-CM

## 2013-06-24 DIAGNOSIS — F0150 Vascular dementia without behavioral disturbance: Secondary | ICD-10-CM

## 2013-06-24 DIAGNOSIS — R5381 Other malaise: Secondary | ICD-10-CM

## 2013-06-24 DIAGNOSIS — N138 Other obstructive and reflux uropathy: Secondary | ICD-10-CM

## 2013-06-24 DIAGNOSIS — K5900 Constipation, unspecified: Secondary | ICD-10-CM

## 2013-06-24 NOTE — Progress Notes (Signed)
Patient ID: [name] [name], male   DOB: 1934-06-29, 79 y.o.   MRN: [id number]    [facility]    PCP: Rivka Spring, MD  Code Status: DNR  Allergies  Allergen Reactions  . Oxycontin [Oxycodone Hcl] Other (See Comments)    Severe confusion    Chief Complaint: new admit  HPI:  79 y/o male pt is here for STR after hospital admission from 06/13/13- 06/18/13 with UTI and urinary retention. He was also noted to have hepatic ad renal lesion for which as per his wife he is having follow up in [place]. He had acute ecephalopathy in setting of infection. He had required a foley catheter He was seen in his room today. He has dementia at baseline and has some confusion at today's visit. He is not able to provide any history or ROS. As per staff, he had a coughing episode after taking a pill the other day. At present he is eating his lunch and coughing in between bites. He is in no distress. Denies any complaints  ROS: Unable to obtain any ROS   Past Medical History  Diagnosis Date  . Coronary artery disease   . Angina   . Hypertension   . Anemia     when had stomach ulcer  . Headache(784.0)   . Arthritis   . Abdominal aneurysm   . Disorder of heart muscle     "thickened heart muscle" per wife  . Carotid artery occlusion   . Bursitis, shoulder     left  . Dizzy spells   . Autonomic orthostatic hypotension   . Peripheral vertigo   . Cluster headaches   . Urinary incontinence   . Stroke 1995, 2006    R side weak, slurred speech, exp. asphasia  . Hx of bladder infections    Past Surgical History  Procedure Laterality Date  . Tonsillectomy    . Hip arthroplasty  10/29/2011    Procedure: ARTHROPLASTY BIPOLAR HIP;  Surgeon: Shelda Pal, MD;  Location: [facility];  Service: Orthopedics;  Laterality: Right;  . Fracture surgery      both hands  . Fracture surgery      Right arm X's 2, back  . Joint replacement  11/03/11    Right hip, Fx from a fall  . Carotid  endarterectomy Right   . Hernia repair Left   . Liver biopsy      x2,    Social History:   reports that he quit smoking about 9 years ago. His smoking use included Cigarettes. He smoked 0.00 packs per day. He has never used smokeless tobacco. He reports that he does not drink alcohol or use illicit drugs.  Family History  Problem Relation Age of Onset  . Hypertension Mother   . Hypertension Father     Medications: Patient's Medications  New Prescriptions   No medications on file  Previous Medications   AMLODIPINE (NORVASC) 10 MG TABLET    Take 1 tablet (10 mg total) by mouth daily.   CALCIUM CARBONATE-VIT D-MIN (CALCIUM 600 + MINERALS PO)    Take 1 tablet by mouth 2 (two) times daily.    CEPHALEXIN (KEFLEX) 500 MG CAPSULE    Take 1 capsule (500 mg total) by mouth 2 (two) times daily. X 4 more days   CLOPIDOGREL (PLAVIX) 75 MG TABLET    Take 1 tablet (75 mg total) by mouth daily.   DOCUSATE SODIUM 100 MG CAPS  Take 100 mg by mouth 2 (two) times daily.   DUTASTERIDE (AVODART) 0.5 MG CAPSULE    Take 0.5 mg by mouth every morning.    FLAVOXATE (URISPAS) 100 MG TABLET    Take 1 tablet (100 mg total) by mouth 3 (three) times daily as needed for bladder spasms.   HYDROCODONE-ACETAMINOPHEN (NORCO/VICODIN) 5-325 MG PER TABLET    Take one tablet by mouth every 4 hours as needed for moderate pain; Take two tablets by mouth every 4 hours as needed for severe pain   MULTIPLE VITAMIN (MULTIVITAMIN WITH MINERALS) TABS TABLET    Take 1 tablet by mouth daily.   MULTIPLE VITAMINS-MINERALS (MACULAR VITAMIN BENEFIT) TABS    Take 1 tablet by mouth daily.   POLYETHYLENE GLYCOL (MIRALAX / GLYCOLAX) PACKET    Take 17 g by mouth daily.    PSYLLIUM (METAMUCIL) 0.52 G CAPSULE    Take 1 capsule (0.52 g total) by mouth daily.   SENNA (SENOKOT) 8.6 MG TABS TABLET    Take 1 tablet (8.6 mg total) by mouth daily. For constipation   SODIUM PHOSPHATE PEDIATRIC (FLEET) 3.5-9.5 GM/59ML ENEMA    Place 66 mLs (1 enema  total) rectally daily as needed for severe constipation.   SOLIFENACIN (VESICARE) 5 MG TABLET    Take 5 mg by mouth every morning.    TAMSULOSIN (FLOMAX) 0.4 MG CAPS CAPSULE    Take 0.4 mg by mouth.  Modified Medications   No medications on file  Discontinued Medications   No medications on file     Physical Exam: Filed Vitals:   06/24/13 1620  BP: 142/72  Pulse: 62  Temp: 97.6 F (36.4 C)  Resp: 19   General- elderly male in no acute distress, speaks in mono syllable Head- atraumatic, normocephalic Eyes- PERRLA, EOMI, no pallor, no icterus Neck- no lymphadenopathy, no thyromegaly, no jugular vein distension, no carotid bruit Cardiovascular- normal s1,s2, no murmurs/ rubs/ gallops Respiratory- bilateral clear to auscultation, no wheeze, no rhonchi, no crackles Abdomen- bowel sounds present, soft, non tender Musculoskeletal- able to move all 4 extremities, on wheelchair, weakness present Neurological- no focal deficit Psychiatry- alert and oriented to person  Labs reviewed: Basic Metabolic Panel:  Recent Labs  16/10/96 0458 06/13/13 1228 06/14/13 0455  NA 138 141 140  K 3.8 3.8 3.6*  CL 105 103 103  CO2 24 25 25   GLUCOSE 116* 140* 100*  BUN 11 14 14   CREATININE 0.98 0.79 0.74  CALCIUM 9.2 9.1 8.9   Liver Function Tests:  Recent Labs  06/13/13 1228  AST 19  ALT 26  ALKPHOS 86  BILITOT 0.7  PROT 7.5  ALBUMIN 3.6  CBC:  Recent Labs  02/15/13 1930  04/07/13 1520  04/08/13 0458 06/13/13 1228 06/14/13 0455  WBC 12.9*  < > 10.9*  --  11.7* 19.1* 13.3*  NEUTROABS 10.3*  --  8.8*  --   --  15.8*  --   HGB 16.9  < > 14.8  < > 13.5 16.2 14.4  HCT 48.6  < > 43.1  < > 39.9 46.2 42.7  MCV 90.0  < > 89.8  --  89.9 90.6 91.8  PLT 195  < > 189  --  163 221 171  < > = values in this interval not displayed. Cardiac Enzymes:  Recent Labs  02/16/13 0120 02/16/13 0628 02/16/13 1315  TROPONINI <0.30 <0.30 <0.30   BNP: No components found with this basename:  POCBNP,  CBG:  Recent Labs  02/16/13 0650  GLUCAP 134*    Radiological Exams: Ct Abdomen Pelvis W Contrast  06/13/2013   CLINICAL DATA:  Pain at the penis and testicle while urinating. History of frequent urinary tract infections. Abdominal aortic aneurysm. Prior liver biopsy.  EXAM: CT ABDOMEN AND PELVIS WITH CONTRAST  TECHNIQUE: Multidetector CT imaging of the abdomen and pelvis was performed using the standard protocol following bolus administration of intravenous contrast.  CONTRAST:  OMNIPAQUE IOHEXOL 300 MG/ML  SOLN  COMPARISON:  CT ABD W/CM dated 08/07/2004; CT ABD W/CM dated 01/14/2003  FINDINGS: Lower Chest: Mild bibasilar volume loss. Mild cardiomegaly with atherosclerosis in the coronary arteries. No pericardial or pleural effusion.  Abdomen/Pelvis: Irregular hepatic contour, most consistent with cirrhosis. Underline probable hepatic steatosis. Heterogeneous enhancement in the left lobe of the liver on image 22/series 2 could represent heterogeneous steatosis or altered perfusion. There is a hepatic dome irregular peripherally enhancing lesion which measures 5.0 x 4.8 cm on image 15/ series 2. This is new since the prior exam. Portal veins patent. Normal spleen, stomach, pancreas.  Mild motion degradation. Normal gallbladder, biliary tract, right adrenal gland. Mild left adrenal thickening and nodularity is chronic.  Scarring in the lower pole right kidney with a dominant 8.2 cm inter/lower pole right renal cyst. Left renal cyst and bilateral too small to characterize renal lesions. A lower pole left renal lesion measures 8 mm and greater than fluid density on image 54/ series 2. This is at the site of a larger lower density lesion on the prior exam.  Punctate lower pole left renal collecting system calculus. No hydronephrosis or hydroureter.  Aortic and branch vessel atherosclerosis. Infrarenal abdominal aortic aneurysm at maximally 4.3 cm today. 3.5 cm at the same level on the prior. A  component of chronic dissection is identified in this area.  More focal wall saccular aneurysm is seen just above the aortic bifurcation at 2.5 cm on image 64. This is increased from 1.1 cm at the same level on the prior. No surrounding hemorrhage or adenopathy in the retroperitoneum.  Degraded evaluation of the pelvis, secondary to right hip arthroplasty and resultant beam hardening artifact.  Colonic stool burden suggests constipation. Normal terminal ileum and appendix. Normal small bowel without abdominal ascites.  Atherosclerosis but no pelvic aneurysm. No pelvic adenopathy. Fat containing right inguinal hernia.  Normal prostate. Foci of increased density within the dependent urinary bladder on image 89 are suspicious for small stones. No significant free fluid. Hyperattenuation in the penile base on image 106/series 2 is not calcified and is favored to be vascular.  Bones/Musculoskeletal: Right hip arthroplasty. Osteopenia. Moderate L2 and mild L4 compression deformities.  IMPRESSION: 1. Hyperattenuation in the dependent bladder is favored to represent small stones. Early contrast excretion felt less likely. 2. Motion and hardware degraded exam. 3. Left nephrolithiasis. 4. Irregular hepatic contour, suspicious for cirrhosis. New hepatic dome lesion which demonstrates peripheral enhancement. Although this could represent a hemangioma, given cirrhosis, hepatocellular carcinoma is a concern. Nonemergent outpatient pre and post contrast abdominal MRI should be considered. 5. Progression of abdominal aortic aneurysm with saccular and fusiform components. No surrounding hemorrhage. 6.  Possible constipation. 7. Hyperattenuation at the penile base is favored to be vascular. Partially calcified urethral stone is felt less likely. 8. Left renal lesion which is not a simple cyst. Recommend attention on followup MRI.   Electronically Signed   By: Jeronimo Greaves M.D.   On: 06/13/2013 17:25   Assessment/Plan  Generalized  weakness- to work  with PT and OT, fall precautions, skin care. His dementia might limit progress though  UTI- currently asymptomatic. Has completed course of keflex. Encouraged hydration  Hypokalemia- recheck bmp  Hypertension- bp has been stable. Continue norvasc 10 mg daily  Leukocytosis- likely in setting of infection. Remains afebrile at present though has confusion  BPH- continue avodart and flomax, monitor clinically  Cough- in between meals raising concern for possible aspiration. Will have SLP evaluate and change his diet  Constipation- continue DSS and miralax  CVA- monitor bp, continue plavix, monitor clinically.   Hepatic and renal lesion- to follow as outpatient with PCP/ oncology  Dementia- persists. Cueing, assistance with feed and strengthen him. Fall precautions and skin care. No acute behavior changes reported   Family/ staff Communication: reviewed care plan with patient and nursing supervisor   Goals of care: STR   Labs/tests ordered: cbc with diff, bmp

## 2013-07-15 ENCOUNTER — Inpatient Hospital Stay (HOSPITAL_COMMUNITY)
Admission: EM | Admit: 2013-07-15 | Discharge: 2013-07-22 | DRG: 064 | Disposition: A | Discharge status: Hospice | Payer: Medicare Other | Attending: Internal Medicine | Admitting: Internal Medicine

## 2013-07-15 ENCOUNTER — Encounter (HOSPITAL_COMMUNITY): Payer: Self-pay | Admitting: Emergency Medicine

## 2013-07-15 ENCOUNTER — Non-Acute Institutional Stay (SKILLED_NURSING_FACILITY): Payer: Medicare Other | Admitting: Adult Health

## 2013-07-15 ENCOUNTER — Emergency Department (HOSPITAL_COMMUNITY): Payer: Medicare Other

## 2013-07-15 ENCOUNTER — Other Ambulatory Visit: Payer: Self-pay

## 2013-07-15 DIAGNOSIS — Z7401 Bed confinement status: Secondary | ICD-10-CM

## 2013-07-15 DIAGNOSIS — I6350 Cerebral infarction due to unspecified occlusion or stenosis of unspecified cerebral artery: Secondary | ICD-10-CM

## 2013-07-15 DIAGNOSIS — I63239 Cerebral infarction due to unspecified occlusion or stenosis of unspecified carotid arteries: Principal | ICD-10-CM | POA: Diagnosis present

## 2013-07-15 DIAGNOSIS — J189 Pneumonia, unspecified organism: Secondary | ICD-10-CM

## 2013-07-15 DIAGNOSIS — E876 Hypokalemia: Secondary | ICD-10-CM | POA: Diagnosis present

## 2013-07-15 DIAGNOSIS — R1310 Dysphagia, unspecified: Secondary | ICD-10-CM

## 2013-07-15 DIAGNOSIS — Z79899 Other long term (current) drug therapy: Secondary | ICD-10-CM

## 2013-07-15 DIAGNOSIS — N179 Acute kidney failure, unspecified: Secondary | ICD-10-CM

## 2013-07-15 DIAGNOSIS — I2510 Atherosclerotic heart disease of native coronary artery without angina pectoris: Secondary | ICD-10-CM | POA: Diagnosis present

## 2013-07-15 DIAGNOSIS — Z515 Encounter for palliative care: Secondary | ICD-10-CM

## 2013-07-15 DIAGNOSIS — R1311 Dysphagia, oral phase: Secondary | ICD-10-CM | POA: Diagnosis present

## 2013-07-15 DIAGNOSIS — R51 Headache: Secondary | ICD-10-CM | POA: Diagnosis present

## 2013-07-15 DIAGNOSIS — T17908A Unspecified foreign body in respiratory tract, part unspecified causing other injury, initial encounter: Secondary | ICD-10-CM

## 2013-07-15 DIAGNOSIS — J690 Pneumonitis due to inhalation of food and vomit: Secondary | ICD-10-CM

## 2013-07-15 DIAGNOSIS — Z7902 Long term (current) use of antithrombotics/antiplatelets: Secondary | ICD-10-CM

## 2013-07-15 DIAGNOSIS — R5381 Other malaise: Secondary | ICD-10-CM

## 2013-07-15 DIAGNOSIS — W19XXXA Unspecified fall, initial encounter: Secondary | ICD-10-CM | POA: Diagnosis present

## 2013-07-15 DIAGNOSIS — E870 Hyperosmolality and hypernatremia: Secondary | ICD-10-CM | POA: Diagnosis not present

## 2013-07-15 DIAGNOSIS — I10 Essential (primary) hypertension: Secondary | ICD-10-CM

## 2013-07-15 DIAGNOSIS — I672 Cerebral atherosclerosis: Secondary | ICD-10-CM | POA: Diagnosis present

## 2013-07-15 DIAGNOSIS — R296 Repeated falls: Secondary | ICD-10-CM

## 2013-07-15 DIAGNOSIS — R32 Unspecified urinary incontinence: Secondary | ICD-10-CM | POA: Diagnosis present

## 2013-07-15 DIAGNOSIS — E785 Hyperlipidemia, unspecified: Secondary | ICD-10-CM

## 2013-07-15 DIAGNOSIS — R471 Dysarthria and anarthria: Secondary | ICD-10-CM | POA: Diagnosis present

## 2013-07-15 DIAGNOSIS — R5383 Other fatigue: Secondary | ICD-10-CM

## 2013-07-15 DIAGNOSIS — R4182 Altered mental status, unspecified: Secondary | ICD-10-CM

## 2013-07-15 DIAGNOSIS — Z885 Allergy status to narcotic agent status: Secondary | ICD-10-CM

## 2013-07-15 DIAGNOSIS — R339 Retention of urine, unspecified: Secondary | ICD-10-CM

## 2013-07-15 DIAGNOSIS — Z9181 History of falling: Secondary | ICD-10-CM

## 2013-07-15 DIAGNOSIS — Z66 Do not resuscitate: Secondary | ICD-10-CM | POA: Diagnosis present

## 2013-07-15 DIAGNOSIS — I69959 Hemiplegia and hemiparesis following unspecified cerebrovascular disease affecting unspecified side: Secondary | ICD-10-CM

## 2013-07-15 DIAGNOSIS — I69920 Aphasia following unspecified cerebrovascular disease: Secondary | ICD-10-CM

## 2013-07-15 DIAGNOSIS — I639 Cerebral infarction, unspecified: Secondary | ICD-10-CM

## 2013-07-15 DIAGNOSIS — R531 Weakness: Secondary | ICD-10-CM

## 2013-07-15 DIAGNOSIS — Z87440 Personal history of urinary (tract) infections: Secondary | ICD-10-CM

## 2013-07-15 DIAGNOSIS — Z96649 Presence of unspecified artificial hip joint: Secondary | ICD-10-CM

## 2013-07-15 DIAGNOSIS — J209 Acute bronchitis, unspecified: Secondary | ICD-10-CM

## 2013-07-15 DIAGNOSIS — Z87891 Personal history of nicotine dependence: Secondary | ICD-10-CM

## 2013-07-15 DIAGNOSIS — Z8673 Personal history of transient ischemic attack (TIA), and cerebral infarction without residual deficits: Secondary | ICD-10-CM

## 2013-07-15 DIAGNOSIS — Z8249 Family history of ischemic heart disease and other diseases of the circulatory system: Secondary | ICD-10-CM

## 2013-07-15 DIAGNOSIS — R063 Periodic breathing: Secondary | ICD-10-CM | POA: Diagnosis not present

## 2013-07-15 LAB — COMPREHENSIVE METABOLIC PANEL
ALBUMIN: 3.4 g/dL — AB (ref 3.5–5.2)
ALK PHOS: 89 U/L (ref 39–117)
ALT: 19 U/L (ref 0–53)
AST: 18 U/L (ref 0–37)
BUN: 9 mg/dL (ref 6–23)
CO2: 27 mEq/L (ref 19–32)
Calcium: 9.2 mg/dL (ref 8.4–10.5)
Chloride: 103 mEq/L (ref 96–112)
Creatinine, Ser: 0.85 mg/dL (ref 0.50–1.35)
GFR calc Af Amer: >90 mL/min (ref >90)
GFR calc non Af Amer: 83 mL/min — ABNORMAL LOW (ref >90)
Glucose, Bld: 102 mg/dL — ABNORMAL HIGH (ref 70–99)
POTASSIUM: 3.5 meq/L — AB (ref 3.7–5.3)
Sodium: 143 mEq/L (ref 137–147)
TOTAL PROTEIN: 7.3 g/dL (ref 6.0–8.3)
Total Bilirubin: 0.4 mg/dL (ref 0.3–1.2)

## 2013-07-15 LAB — BASIC METABOLIC PANEL
ANION GAP: 6 — AB (ref 7–16)
BUN: 10 mg/dL (ref 7–18)
Calcium, Total: 9.1 mg/dL (ref 8.5–10.1)
Chloride: 108 mmol/L — ABNORMAL HIGH (ref 98–107)
Co2: 27 mmol/L (ref 21–32)
Creatinine: 0.87 mg/dL (ref 0.60–1.30)
EGFR (African American): >60
GLUCOSE: 132 mg/dL — AB (ref 65–99)
Osmolality: 282 (ref 275–301)
POTASSIUM: 3.5 mmol/L (ref 3.5–5.1)
SODIUM: 141 mmol/L (ref 136–145)

## 2013-07-15 LAB — CBC WITH DIFFERENTIAL/PLATELET
BASOS ABS: 0.1 10*3/uL (ref 0.0–0.1)
BASOS PCT: 1 % (ref 0–1)
Basophil #: 0.1 10*3/uL (ref 0.0–0.1)
Basophil %: 0.6 %
EOS PCT: 3.9 %
Eosinophil #: 0.5 10*3/uL (ref 0.0–0.7)
Eosinophils Absolute: 0.5 10*3/uL (ref 0.0–0.7)
Eosinophils Relative: 5 % (ref 0–5)
HCT: 43.3 % (ref 40.0–52.0)
HCT: 41.5 % (ref 39.0–52.0)
HEMOGLOBIN: 14.6 g/dL (ref 13.0–17.0)
HGB: 14.5 g/dL (ref 13.0–18.0)
LYMPHS ABS: 1.2 10*3/uL (ref 1.0–3.6)
Lymphocyte %: 10.3 %
Lymphocytes Relative: 21 % (ref 12–46)
Lymphs Abs: 2.2 10*3/uL (ref 0.7–4.0)
MCH: 29.7 pg (ref 26.0–34.0)
MCH: 31.3 pg (ref 26.0–34.0)
MCHC: 33.4 g/dL (ref 32.0–36.0)
MCHC: 35.2 g/dL (ref 30.0–36.0)
MCV: 89 fL (ref 80–100)
MCV: 88.9 fL (ref 78.0–100.0)
MONOS PCT: 8.2 %
MONOS PCT: 9 % (ref 3–12)
Monocyte #: 1 x10 3/mm (ref 0.2–1.0)
Monocytes Absolute: 0.9 10*3/uL (ref 0.1–1.0)
NEUTROS ABS: 6.9 10*3/uL (ref 1.7–7.7)
NEUTROS PCT: 65 % (ref 43–77)
Neutrophil #: 9.1 10*3/uL — ABNORMAL HIGH (ref 1.4–6.5)
Neutrophil %: 77 %
PLATELETS: 217 10*3/uL (ref 150–400)
Platelet: 207 10*3/uL (ref 150–440)
RBC: 4.87 10*6/uL (ref 4.40–5.90)
RBC: 4.67 MIL/uL (ref 4.22–5.81)
RDW: 13.5 % (ref 11.5–14.5)
RDW: 13.1 % (ref 11.5–15.5)
WBC: 11.8 10*3/uL — AB (ref 3.8–10.6)
WBC: 10.7 10*3/uL — ABNORMAL HIGH (ref 4.0–10.5)

## 2013-07-15 LAB — URINE MICROSCOPIC-ADD ON

## 2013-07-15 LAB — URINALYSIS, ROUTINE W REFLEX MICROSCOPIC
Bilirubin Urine: NEGATIVE
GLUCOSE, UA: NEGATIVE mg/dL
Ketones, ur: NEGATIVE mg/dL
Nitrite: NEGATIVE
PH: 7.5 (ref 5.0–8.0)
Protein, ur: NEGATIVE mg/dL
Specific Gravity, Urine: 1.012 (ref 1.005–1.030)
Urobilinogen, UA: 0.2 mg/dL (ref 0.0–1.0)

## 2013-07-15 LAB — POCT I-STAT TROPONIN I: TROPONIN I, POC: 0.01 ng/mL (ref 0.00–0.08)

## 2013-07-15 MED ORDER — DEXTROSE 5 % IV SOLN
1.0000 g | Freq: Two times a day (BID) | INTRAVENOUS | Status: DC
  Administered 2013-07-15 – 2013-07-16 (×2): 1 g via INTRAVENOUS
  Filled 2013-07-15 (×4): qty 1

## 2013-07-15 MED ORDER — HYDRALAZINE HCL 20 MG/ML IJ SOLN: 10.0000 mg | INTRAMUSCULAR | Status: DC | PRN

## 2013-07-15 MED ORDER — ASPIRIN 300 MG RE SUPP
300.0000 mg | Freq: Every day | RECTAL | Status: DC
  Administered 2013-07-16 – 2013-07-21 (×7): 300 mg via RECTAL
  Filled 2013-07-15 (×7): qty 1

## 2013-07-15 MED ORDER — CLOPIDOGREL BISULFATE 75 MG PO TABS
75.0000 mg | ORAL_TABLET | Freq: Every day | ORAL | Status: DC
  Filled 2013-07-15 (×2): qty 1

## 2013-07-15 MED ORDER — SODIUM CHLORIDE 0.9 % IV SOLN
INTRAVENOUS | Status: AC
  Administered 2013-07-16: 01:00:00 via INTRAVENOUS

## 2013-07-15 MED ORDER — PANTOPRAZOLE SODIUM 40 MG IV SOLR
40.0000 mg | INTRAVENOUS | Status: DC
  Administered 2013-07-16 – 2013-07-20 (×6): 40 mg via INTRAVENOUS
  Filled 2013-07-15 (×7): qty 40

## 2013-07-15 MED ORDER — ONDANSETRON HCL 4 MG/2ML IJ SOLN: 4.0000 mg | Freq: Three times a day (TID) | INTRAMUSCULAR | Status: AC | PRN

## 2013-07-15 MED ORDER — ENOXAPARIN SODIUM 40 MG/0.4ML ~~LOC~~ SOLN
40.0000 mg | Freq: Every day | SUBCUTANEOUS | Status: DC
  Administered 2013-07-16 – 2013-07-19 (×4): 40 mg via SUBCUTANEOUS
  Filled 2013-07-15 (×4): qty 0.4

## 2013-07-15 MED ORDER — VANCOMYCIN HCL 10 G IV SOLR
1500.0000 mg | Freq: Once | INTRAVENOUS | Status: AC
  Administered 2013-07-15: 1500 mg via INTRAVENOUS
  Filled 2013-07-15 (×2): qty 1500

## 2013-07-15 MED ORDER — SODIUM CHLORIDE 0.9 % IV BOLUS (SEPSIS)
1000.0000 mL | Freq: Once | INTRAVENOUS | Status: AC
Start: 2013-07-15 — End: 2013-07-15
  Administered 2013-07-15: 1000 mL via INTRAVENOUS

## 2013-07-15 MED ORDER — VANCOMYCIN HCL IN DEXTROSE 1-5 GM/200ML-% IV SOLN
1000.0000 mg | Freq: Two times a day (BID) | INTRAVENOUS | Status: DC
  Administered 2013-07-16 – 2013-07-18 (×5): 1000 mg via INTRAVENOUS
  Filled 2013-07-15 (×5): qty 200

## 2013-07-15 MED ORDER — ONDANSETRON HCL 4 MG/2ML IJ SOLN: 4.0000 mg | Freq: Four times a day (QID) | INTRAMUSCULAR | Status: DC | PRN

## 2013-07-15 MED ORDER — HYDROMORPHONE HCL PF 1 MG/ML IJ SOLN
0.5000 mg | INTRAMUSCULAR | Status: AC | PRN
  Administered 2013-07-16: 0.5 mg via INTRAVENOUS
  Filled 2013-07-15: qty 1

## 2013-07-15 NOTE — ED Notes (Signed)
Pt remains in radiology 

## 2013-07-15 NOTE — ED Provider Notes (Signed)
CSN: [id number]     Arrival date & time 07/15/13  1804 History   First MD Initiated Contact with Patient 07/15/13 1807     Chief Complaint  Patient presents with  . Pneumonia  . Aphasia   (Consider location/radiation/quality/duration/timing/severity/associated sxs/prior Treatment) Patient is a 79 y.o. male presenting with general illness. The history is provided by the patient. The history is limited by the condition of the patient.  Illness Severity:  Moderate Onset quality:  Gradual Duration:  8 hours Timing:  Constant Progression:  Partially resolved Chronicity:  Recurrent Associated symptoms: fatigue   Associated symptoms: no abdominal pain, no chest pain, no congestion, no cough, no diarrhea, no fever, no headaches, no myalgias, no nausea, no rash, no rhinorrhea, no shortness of breath and no vomiting     79 year old male with a chief complaint of weakness aphasia. Family states that they noticed this about 10 AM. Patient in rehabilitation currently for a right-sided weakness from a CVA. Family states that they found him with a head down on his chest and drooling. Patient not able to talk to them at all at that time. Family became concerned talked to the nursing staff. A chest x-ray was performed and they told the family that he had pneumonia. The family then waited to speak with the doctor and when they had not seen on by 5 PM they inquired about this and they were told that the doctor was not coming if they wanted to see a doctor they needed to get to the emergency department. Family states normally at baseline he is unable to walk on his own needs assistance from room to room. But normally is able to say some words to family. Currently patient is dysarthric. Able to follow commands.  Past Medical History  Diagnosis Date  . Coronary artery disease   . Angina   . Hypertension   . Anemia     when had stomach ulcer  . Headache(784.0)   . Arthritis   . Abdominal aneurysm   .  Disorder of heart muscle     "thickened heart muscle" per wife  . Carotid artery occlusion   . Bursitis, shoulder     left  . Dizzy spells   . Autonomic orthostatic hypotension   . Peripheral vertigo   . Cluster headaches   . Urinary incontinence   . Stroke 1995, 2006    R side weak, slurred speech, exp. asphasia  . Hx of bladder infections    Past Surgical History  Procedure Laterality Date  . Tonsillectomy    . Hip arthroplasty  10/29/2011    Procedure: ARTHROPLASTY BIPOLAR HIP;  Surgeon: Shelda Pal, MD;  Location: [facility];  Service: Orthopedics;  Laterality: Right;  . Fracture surgery      both hands  . Fracture surgery      Right arm X's 2, back  . Joint replacement  11/03/11    Right hip, Fx from a fall  . Carotid endarterectomy Right   . Hernia repair Left   . Liver biopsy      x2,    Family History  Problem Relation Age of Onset  . Hypertension Mother   . Hypertension Father    History  Substance Use Topics  . Smoking status: Former Smoker    Types: Cigarettes    Quit date: 06/30/2003  . Smokeless tobacco: Never Used  . Alcohol Use: No    Review of Systems  Constitutional: Positive for fatigue. Negative for fever  and chills.  HENT: Negative for congestion, facial swelling and rhinorrhea.   Eyes: Negative for discharge and visual disturbance.  Respiratory: Negative for cough and shortness of breath.   Cardiovascular: Negative for chest pain and palpitations.  Gastrointestinal: Negative for nausea, vomiting, abdominal pain and diarrhea.  Musculoskeletal: Negative for arthralgias and myalgias.  Skin: Negative for color change and rash.  Neurological: Positive for speech difficulty. Negative for tremors, syncope, light-headedness and headaches.       Difficulty swallowing  Psychiatric/Behavioral: Positive for confusion. Negative for dysphoric mood.    Allergies  Oxycontin  Home Medications   No current outpatient prescriptions on file. BP 178/79   Pulse 70  Temp(Src) 97.6 F (36.4 C) (Oral)  Resp 18  Ht 6\' 2"  (1.88 m)  Wt 169 lb 11.2 oz (76.975 kg)  BMI 21.78 kg/m2  SpO2 93% Physical Exam  Constitutional: He appears well-developed and well-nourished.  HENT:  Head: Normocephalic and atraumatic.  Eyes: EOM are normal. Pupils are equal, round, and reactive to light.  Neck: Normal range of motion. Neck supple. No JVD present.  Cardiovascular: Normal rate and regular rhythm.  Exam reveals no gallop and no friction rub.   No murmur heard. Pulmonary/Chest: No respiratory distress. He has no wheezes.  Abdominal: He exhibits no distension. There is no tenderness. There is no rebound and no guarding.  Musculoskeletal: Normal range of motion.  Neurological: He is alert.  Patient able to lift right upper extremity and right lower extremity up against gravity. Unable to against my hand. No noted facial droop. Patient dysarthric. Able to follow commands able to nod yes or no.  Skin: No rash noted. No pallor.    ED Course  Procedures (including critical care time) Labs Review Labs Reviewed  CBC WITH DIFFERENTIAL - Abnormal; Notable for the following:    WBC 10.7 (*)    All other components within normal limits  COMPREHENSIVE METABOLIC PANEL - Abnormal; Notable for the following:    Potassium 3.5 (*)    Glucose, Bld 102 (*)    Albumin 3.4 (*)    GFR calc non Af Amer 83 (*)    All other components within normal limits  URINALYSIS, ROUTINE W REFLEX MICROSCOPIC - Abnormal; Notable for the following:    APPearance CLOUDY (*)    Hgb urine dipstick TRACE (*)    Leukocytes, UA LARGE (*)    All other components within normal limits  URINE MICROSCOPIC-ADD ON - Abnormal; Notable for the following:    Bacteria, UA FEW (*)    All other components within normal limits  URINE CULTURE  CULTURE, BLOOD (ROUTINE X 2)  CULTURE, BLOOD (ROUTINE X 2)  AMMONIA  LACTIC ACID, PLASMA  HEMOGLOBIN A1C  LIPID PANEL  POCT I-STAT TROPONIN I   Imaging  Review Dg Chest 1 View  07/15/2013   CLINICAL DATA:  Cerebral vascular accident.  Unable to verbalize.  EXAM: CHEST - 1 VIEW  COMPARISON:  DG CHEST 2 VIEW dated 02/15/2013  FINDINGS: Mildly enlarged cardiac silhouette. There is peripheral airspace disease in right upper lobes consistent with pneumonia. No pleural fluid. No pneumothorax. Vascular clips in the right neck.  IMPRESSION: Right upper lobe pneumonia.   Electronically Signed   By: Genevive Bi M.D.   On: 07/15/2013 20:17   Mr Maxine Glenn Head Wo Contrast  07/15/2013   CLINICAL DATA:  Not able to speak since this morning. Two prior strokes with right-sided deficits. Hypertension.  EXAM: MRI HEAD WITHOUT CONTRAST  MRA  HEAD WITHOUT CONTRAST  TECHNIQUE: Multiplanar, multiecho pulse sequences of the brain and surrounding structures were obtained without intravenous contrast. Angiographic images of the head were obtained using MRA technique without contrast.  COMPARISON:  02/15/2013 CT.  12/25/2004 MR.  FINDINGS: MRI HEAD FINDINGS  Acute nonhemorrhagic infarct posterior right lenticular nucleus extending into the a posterior right coronal radiata.  Remote large left hemispheric infarct with encephalomalacia and subsequent dilation of the left lateral ventricle. Small amount of blood breakdown products along the periphery of this remote infarct.  Remote coronal radiata infarcts and basal ganglia infarcts.  Small vessel disease type changes.  Tiny area of blood breakdown products right thalamus most likely related to prior hemorrhage ischemia. Otherwise no evidence of intracranial hemorrhage.  Global atrophy without hydrocephalus.  No intracranial mass lesion noted on this unenhanced exam.  Chronically occluded left internal carotid artery. Please see below.  Transverse ligament hypertrophy. Cervical medullary junction, pituitary region, pineal region and orbital structures unremarkable.  MRA HEAD FINDINGS  Chronically occluded left internal carotid artery. Minimal  reconstitution of flow to portions of the left middle cerebral artery. The branches which have flow are markedly diminutive size and irregular.  Mild irregularity and narrowing of the supraclinoid segment of the right internal carotid artery.  No high-grade stenosis of the right carotid terminus, M1 segment or A1 segment of the right internal carotid artery or right anterior cerebral artery. Partial filling of left anterior cerebral artery A2 segment from the right.  Codominant vertebral arteries with mild narrowing of the distal vertebral artery slightly greater on the left.  Mild narrowing mid aspect of the basilar artery.  Non visualization right posterior inferior cerebellar artery.  Small irregular left anterior inferior cerebellar artery.  Mild irregularity of the left superior cerebellar artery.  No aneurysm noted.  IMPRESSION: Acute nonhemorrhagic infarct posterior right lenticular nucleus extending into the a posterior right coronal radiata.  Remote large left hemispheric infarct.  Chronically occluded left internal carotid artery.  Intracranial atherosclerotic type changes otherwise as detailed above.  Please see above for additional findings.  These results were called by telephone at the time of interpretation on 07/15/2013 at 10:03 PM to Dr. Wilkie Aye , who verbally acknowledged these results.   Electronically Signed   By: Bridgett Larsson M.D.   On: 07/15/2013 22:05   Mr Brain Wo Contrast  07/15/2013   CLINICAL DATA:  Not able to speak since this morning. Two prior strokes with right-sided deficits. Hypertension.  EXAM: MRI HEAD WITHOUT CONTRAST  MRA HEAD WITHOUT CONTRAST  TECHNIQUE: Multiplanar, multiecho pulse sequences of the brain and surrounding structures were obtained without intravenous contrast. Angiographic images of the head were obtained using MRA technique without contrast.  COMPARISON:  02/15/2013 CT.  12/25/2004 MR.  FINDINGS: MRI HEAD FINDINGS  Acute nonhemorrhagic infarct posterior right  lenticular nucleus extending into the a posterior right coronal radiata.  Remote large left hemispheric infarct with encephalomalacia and subsequent dilation of the left lateral ventricle. Small amount of blood breakdown products along the periphery of this remote infarct.  Remote coronal radiata infarcts and basal ganglia infarcts.  Small vessel disease type changes.  Tiny area of blood breakdown products right thalamus most likely related to prior hemorrhage ischemia. Otherwise no evidence of intracranial hemorrhage.  Global atrophy without hydrocephalus.  No intracranial mass lesion noted on this unenhanced exam.  Chronically occluded left internal carotid artery. Please see below.  Transverse ligament hypertrophy. Cervical medullary junction, pituitary region, pineal region and orbital structures unremarkable.  MRA HEAD FINDINGS  Chronically occluded left internal carotid artery. Minimal reconstitution of flow to portions of the left middle cerebral artery. The branches which have flow are markedly diminutive size and irregular.  Mild irregularity and narrowing of the supraclinoid segment of the right internal carotid artery.  No high-grade stenosis of the right carotid terminus, M1 segment or A1 segment of the right internal carotid artery or right anterior cerebral artery. Partial filling of left anterior cerebral artery A2 segment from the right.  Codominant vertebral arteries with mild narrowing of the distal vertebral artery slightly greater on the left.  Mild narrowing mid aspect of the basilar artery.  Non visualization right posterior inferior cerebellar artery.  Small irregular left anterior inferior cerebellar artery.  Mild irregularity of the left superior cerebellar artery.  No aneurysm noted.  IMPRESSION: Acute nonhemorrhagic infarct posterior right lenticular nucleus extending into the a posterior right coronal radiata.  Remote large left hemispheric infarct.  Chronically occluded left internal  carotid artery.  Intracranial atherosclerotic type changes otherwise as detailed above.  Please see above for additional findings.  These results were called by telephone at the time of interpretation on 07/15/2013 at 10:03 PM to Dr. Wilkie AyeHorton , who verbally acknowledged these results.   Electronically Signed   By: Bridgett LarssonSteve  Olson M.D.   On: 07/15/2013 22:05      MDM   1. CVA (cerebral infarction)    79 year old male with a chief complaint of inability to speak and fever and weakness. Patient with new dysarthria concern for possible new stroke obtained chest x-ray. His chest x-ray concerning for rate metal lobe pneumonia. Will treat patient for hospital acquired pneumonia with vancomycin cefepime. Patient found on MRI to have a new lacunar stroke. Neurology consult is feel the patient needs admission. Patient now unable to swallow with this new stroke.  Will admit to medicine.    Melene Planan Ashutosh Dieguez, MD 07/16/13 [phone]

## 2013-07-15 NOTE — ED Notes (Signed)
phlebotomy at bedside.  

## 2013-07-15 NOTE — H&P (Signed)
[facility] History and Physical  Patient: [name] [name]  ZOX:[id number]  DOB: 02/03/1935  DOS: the patient was seen and examined on 07/15/2013 PCP: Rivka SpringOHEN, PAUL S, MD  Chief Complaint: Difficulty with speech  HPI: [name] Hoardward G Mickel is a 79 y.o. male with Past medical history of coronary artery disease, hypertension, recurrent CVA, recurrent fall, dyslipidemia, urinary incontinence. recurrent UTI, The patient is coming from [facility] The patient presents with episode of speech difficulty and drop off in the of his mouth that was noted around 10:00.the history was obtained from patient's wife as the patient has aphasia.on 8:30 in the morning the wife saw the patient has fine and at his baseline.when she returned around 10:00 she found that the patient was sitting in his chair, with his head down and drooling from his mouth. He also started having some cough and a chest x-ray was performed there which showed pneumonia. Around on 4:30 the family decided to bring the patient to hospital.  As per the wife at his baseline the patient is not ambulating and requires significant assistance for his ADL occasionally can speak some words.he has recurrent UTIs recurrent falls. As per the wife he has been taking his medication.no recent falls.  Review of Systems: as mentioned in the history of present illness.  A Comprehensive review of the other systems is negative.  Past Medical History  Diagnosis Date  . Coronary artery disease   . Angina   . Hypertension   . Anemia     when had stomach ulcer  . Headache(784.0)   . Arthritis   . Abdominal aneurysm   . Disorder of heart muscle     "thickened heart muscle" per wife  . Carotid artery occlusion   . Bursitis, shoulder     left  . Dizzy spells   . Autonomic orthostatic hypotension   . Peripheral vertigo   . Cluster headaches   . Urinary incontinence   . Stroke 1995, 2006    R side weak, slurred speech, exp. asphasia  . Hx of bladder  infections    Past Surgical History  Procedure Laterality Date  . Tonsillectomy    . Hip arthroplasty  10/29/2011    Procedure: ARTHROPLASTY BIPOLAR HIP;  Surgeon: Shelda PalMatthew D Olin, MD;  Location: [facility];  Service: Orthopedics;  Laterality: Right;  . Fracture surgery      both hands  . Fracture surgery      Right arm X's 2, back  . Joint replacement  11/03/11    Right hip, Fx from a fall  . Carotid endarterectomy Right   . Hernia repair Left   . Liver biopsy      x2,    Social History:  reports that he quit smoking about 10 years ago. His smoking use included Cigarettes. He smoked 0.00 packs per day. He has never used smokeless tobacco. He reports that he does not drink alcohol or use illicit drugs.  dependent for most of his  ADL.  Allergies  Allergen Reactions  . Oxycontin [Oxycodone Hcl] Other (See Comments)    Severe confusion    Family History  Problem Relation Age of Onset  . Hypertension Mother   . Hypertension Father     Prior to Admission medications   Medication Sig Start Date End Date Taking? Authorizing Provider  amLODipine (NORVASC) 10 MG tablet Take 1 tablet (10 mg total) by mouth daily. 06/17/13   Ripudeep Jenna LuoK Rai, MD  Calcium Carbonate-Vit D-Min (CALCIUM 600 + MINERALS  PO) Take 1 tablet by mouth 2 (two) times daily.     Historical Provider, MD  cephALEXin (KEFLEX) 500 MG capsule Take 1 capsule (500 mg total) by mouth 2 (two) times daily. X 4 more days 06/17/13   Ripudeep Jenna Luo, MD  clopidogrel (PLAVIX) 75 MG tablet Take 1 tablet (75 mg total) by mouth daily. 02/15/13   Levert Feinstein, MD  docusate sodium 100 MG CAPS Take 100 mg by mouth 2 (two) times daily. 06/17/13   Ripudeep Jenna Luo, MD  dutasteride (AVODART) 0.5 MG capsule Take 0.5 mg by mouth every morning.     Historical Provider, MD  flavoxATE (URISPAS) 100 MG tablet Take 1 tablet (100 mg total) by mouth 3 (three) times daily as needed for bladder spasms. 06/17/13   Ripudeep Jenna Luo, MD  HYDROcodone-acetaminophen  (NORCO/VICODIN) 5-325 MG per tablet Take one tablet by mouth every 4 hours as needed for moderate pain; Take two tablets by mouth every 4 hours as needed for severe pain 06/18/13   Monina C Medina-Vargas, NP  Multiple Vitamin (MULTIVITAMIN WITH MINERALS) TABS tablet Take 1 tablet by mouth daily.    Historical Provider, MD  Multiple Vitamins-Minerals (MACULAR VITAMIN BENEFIT) TABS Take 1 tablet by mouth daily.    Historical Provider, MD  polyethylene glycol (MIRALAX / GLYCOLAX) packet Take 17 g by mouth daily.     Historical Provider, MD  psyllium (METAMUCIL) 0.52 G capsule Take 1 capsule (0.52 g total) by mouth daily. 06/17/13   Ripudeep Jenna Luo, MD  senna (SENOKOT) 8.6 MG TABS tablet Take 1 tablet (8.6 mg total) by mouth daily. For constipation 06/17/13   Ripudeep Jenna Luo, MD  sodium phosphate Pediatric (FLEET) 3.5-9.5 GM/59ML enema Place 66 mLs (1 enema total) rectally daily as needed for severe constipation. 06/17/13   Ripudeep Jenna Luo, MD  solifenacin (VESICARE) 5 MG tablet Take 5 mg by mouth every morning.     Historical Provider, MD  tamsulosin (FLOMAX) 0.4 MG CAPS capsule Take 0.4 mg by mouth.    Historical Provider, MD    Physical Exam: Filed Vitals:   07/15/13 1845 07/15/13 1932 07/15/13 2245 07/15/13 2300  BP: 183/85 177/75 166/75 172/71  Pulse: 72 71 74 75  Temp:      TempSrc:      Resp: 16 18 17 17   SpO2: 97% 94% 95% 94%    General: Alert, Awake and Oriented to Time, Place and Person. Appear in mild distress Eyes: PERRL ENT: Oral Mucosa clear moist. Neck:  no JVD Cardiovascular: S1 and S2 Present,  aortic systolic Murmur, Peripheral Pulses Present Respiratory: Bilateral Air entry equal and Decreased, bilateral right more than left Crackles, no wheezes Abdomen: Bowel Sound Present, Soft and Non tender Skin:  no Rash Extremities:  No Pedal edema,  no  calf tenderness Neurologic: Mental status alert and oriented appears anxious , Cranial Nerves  pupils are reactive good cough , Motor  strength limited on right side 3/5, 5/5 left side , Sensation  decreased on right , reflexes bilateral equal , babinski equivocal , Cerebellar test  difficulty with finger-nose-finger on the left unable to perform right.  Labs on Admission:  CBC:  Recent Labs Lab 07/15/13 1828  WBC 10.7*  NEUTROABS 6.9  HGB 14.6  HCT 41.5  MCV 88.9  PLT 217    CMP     Component Value Date/Time   NA 143 07/15/2013 1828   K 3.5* 07/15/2013 1828   CL 103 07/15/2013 1828   CO2  27 07/15/2013 1828   GLUCOSE 102* 07/15/2013 1828   BUN 9 07/15/2013 1828   CREATININE 0.85 07/15/2013 1828   CALCIUM 9.2 07/15/2013 1828   PROT 7.3 07/15/2013 1828   ALBUMIN 3.4* 07/15/2013 1828   AST 18 07/15/2013 1828   ALT 19 07/15/2013 1828   ALKPHOS 89 07/15/2013 1828   BILITOT 0.4 07/15/2013 1828   GFRNONAA 83* 07/15/2013 1828   GFRAA >90 07/15/2013 1828    No results found for this basename: LIPASE, AMYLASE,  in the last 168 hours No results found for this basename: AMMONIA,  in the last 168 hours  No results found for this basename: CKTOTAL, CKMB, CKMBINDEX, TROPONINI,  in the last 168 hours BNP (last 3 results) No results found for this basename: PROBNP,  in the last 8760 hours  Radiological Exams on Admission: Dg Chest 1 View  07/15/2013   CLINICAL DATA:  Cerebral vascular accident.  Unable to verbalize.  EXAM: CHEST - 1 VIEW  COMPARISON:  DG CHEST 2 VIEW dated 02/15/2013  FINDINGS: Mildly enlarged cardiac silhouette. There is peripheral airspace disease in right upper lobes consistent with pneumonia. No pleural fluid. No pneumothorax. Vascular clips in the right neck.  IMPRESSION: Right upper lobe pneumonia.   Electronically Signed   By: Genevive Bi M.D.   On: 07/15/2013 20:17   Mr Maxine Glenn Head Wo Contrast  07/15/2013   CLINICAL DATA:  Not able to speak since this morning. Two prior strokes with right-sided deficits. Hypertension.  EXAM: MRI HEAD WITHOUT CONTRAST  MRA HEAD WITHOUT CONTRAST  TECHNIQUE: Multiplanar, multiecho pulse  sequences of the brain and surrounding structures were obtained without intravenous contrast. Angiographic images of the head were obtained using MRA technique without contrast.  COMPARISON:  02/15/2013 CT.  12/25/2004 MR.  FINDINGS: MRI HEAD FINDINGS  Acute nonhemorrhagic infarct posterior right lenticular nucleus extending into the a posterior right coronal radiata.  Remote large left hemispheric infarct with encephalomalacia and subsequent dilation of the left lateral ventricle. Small amount of blood breakdown products along the periphery of this remote infarct.  Remote coronal radiata infarcts and basal ganglia infarcts.  Small vessel disease type changes.  Tiny area of blood breakdown products right thalamus most likely related to prior hemorrhage ischemia. Otherwise no evidence of intracranial hemorrhage.  Global atrophy without hydrocephalus.  No intracranial mass lesion noted on this unenhanced exam.  Chronically occluded left internal carotid artery. Please see below.  Transverse ligament hypertrophy. Cervical medullary junction, pituitary region, pineal region and orbital structures unremarkable.  MRA HEAD FINDINGS  Chronically occluded left internal carotid artery. Minimal reconstitution of flow to portions of the left middle cerebral artery. The branches which have flow are markedly diminutive size and irregular.  Mild irregularity and narrowing of the supraclinoid segment of the right internal carotid artery.  No high-grade stenosis of the right carotid terminus, M1 segment or A1 segment of the right internal carotid artery or right anterior cerebral artery. Partial filling of left anterior cerebral artery A2 segment from the right.  Codominant vertebral arteries with mild narrowing of the distal vertebral artery slightly greater on the left.  Mild narrowing mid aspect of the basilar artery.  Non visualization right posterior inferior cerebellar artery.  Small irregular left anterior inferior cerebellar  artery.  Mild irregularity of the left superior cerebellar artery.  No aneurysm noted.  IMPRESSION: Acute nonhemorrhagic infarct posterior right lenticular nucleus extending into the a posterior right coronal radiata.  Remote large left hemispheric infarct.  Chronically occluded  left internal carotid artery.  Intracranial atherosclerotic type changes otherwise as detailed above.  Please see above for additional findings.  These results were called by telephone at the time of interpretation on 07/15/2013 at 10:03 PM to Dr. Wilkie Aye , who verbally acknowledged these results.   Electronically Signed   By: Bridgett Larsson M.D.   On: 07/15/2013 22:05   Mr Brain Wo Contrast  07/15/2013   CLINICAL DATA:  Not able to speak since this morning. Two prior strokes with right-sided deficits. Hypertension.  EXAM: MRI HEAD WITHOUT CONTRAST  MRA HEAD WITHOUT CONTRAST  TECHNIQUE: Multiplanar, multiecho pulse sequences of the brain and surrounding structures were obtained without intravenous contrast. Angiographic images of the head were obtained using MRA technique without contrast.  COMPARISON:  02/15/2013 CT.  12/25/2004 MR.  FINDINGS: MRI HEAD FINDINGS  Acute nonhemorrhagic infarct posterior right lenticular nucleus extending into the a posterior right coronal radiata.  Remote large left hemispheric infarct with encephalomalacia and subsequent dilation of the left lateral ventricle. Small amount of blood breakdown products along the periphery of this remote infarct.  Remote coronal radiata infarcts and basal ganglia infarcts.  Small vessel disease type changes.  Tiny area of blood breakdown products right thalamus most likely related to prior hemorrhage ischemia. Otherwise no evidence of intracranial hemorrhage.  Global atrophy without hydrocephalus.  No intracranial mass lesion noted on this unenhanced exam.  Chronically occluded left internal carotid artery. Please see below.  Transverse ligament hypertrophy. Cervical medullary  junction, pituitary region, pineal region and orbital structures unremarkable.  MRA HEAD FINDINGS  Chronically occluded left internal carotid artery. Minimal reconstitution of flow to portions of the left middle cerebral artery. The branches which have flow are markedly diminutive size and irregular.  Mild irregularity and narrowing of the supraclinoid segment of the right internal carotid artery.  No high-grade stenosis of the right carotid terminus, M1 segment or A1 segment of the right internal carotid artery or right anterior cerebral artery. Partial filling of left anterior cerebral artery A2 segment from the right.  Codominant vertebral arteries with mild narrowing of the distal vertebral artery slightly greater on the left.  Mild narrowing mid aspect of the basilar artery.  Non visualization right posterior inferior cerebellar artery.  Small irregular left anterior inferior cerebellar artery.  Mild irregularity of the left superior cerebellar artery.  No aneurysm noted.  IMPRESSION: Acute nonhemorrhagic infarct posterior right lenticular nucleus extending into the a posterior right coronal radiata.  Remote large left hemispheric infarct.  Chronically occluded left internal carotid artery.  Intracranial atherosclerotic type changes otherwise as detailed above.  Please see above for additional findings.  These results were called by telephone at the time of interpretation on 07/15/2013 at 10:03 PM to Dr. Wilkie Aye , who verbally acknowledged these results.   Electronically Signed   By: Bridgett Larsson M.D.   On: 07/15/2013 22:05    EKG: Independently reviewed. normal EKG, normal sinus rhythm.  Assessment/Plan Principal Problem:   CVA (cerebral infarction) Active Problems:   HLD (hyperlipidemia)   History of CVA (cerebrovascular accident)   Accelerated hypertension   HCAP (healthcare-associated pneumonia)   Aspiration into airway   Recurrent falls   1. CVA (cerebral infarction)  the patient is  presenting with episode of  Worsening weakness on his right side, he does have prior history of CVA on the same side and has been bed bound.he is compliant with Plavix. His MRI in the ED is positive for acute lenticular infarct. Neurology has  consulted on the patient and recommend further workup PTOT, speech therapy consultation.patient will be kept n.p.o. Marland Kitchenat present given dose of aspirin Echocardiogram and carotid Doppler. Telemetry monitoring. Hemoglobin A1c and lipid profile.  2. healthcare associated pneumonia/aspiration pneumonia The patient is presenting with right-sided infiltrate with cough. There is a high chance that he might have aspirated. Keeping him n.p.o., IV fluids, IV vancomycin and IV cefepime, oxygen as needed.  3.Accelerated hypertension At present controlling blood pressure only if it is above 180 with IV hydralazine when necessary  Consults:  neurology appreciate input   DVT Prophylaxis: subcutaneous Heparin Nutrition:  n.p.o.   Code Status:  Full, patient and his wife requested to attempt resuscitation in the event of cardiac and pulmonary arrest, he does have prior living will stating DO NOT RESUSCITATE as per family. Currently CODE STATUS full Family Communication:  wife  was present at bedside, opportunity was given to ask question and all questions were answered satisfactorily at the time of interview. Disposition: Admitted to inpatient in telemetry unit.  Author: Lynden Oxford, MD [facility] Pager: [phone] 07/15/2013, 11:52 PM    If 7PM-7AM, please contact night-coverage [email or web address] Password [email or web address]

## 2013-07-15 NOTE — Progress Notes (Signed)
Patient ID: [name] [name], male   DOB: 04/02/1935, 79 y.o.   MRN: [id number]     Allergies  Allergen Reactions  . Oxycontin [Oxycodone Hcl] Other (See Comments)    Severe confusion     Chief Complaint  Patient presents with  . Acute Visit    patient concerns    HPI: There are concerns that he has had a change in status. He has had increased weakness on his left side. He is less verbal. He appears weaker than yesterday. He cannot fully participate in the hpi or ros. His wife is concerned that he may have had another cva; however; at this time I am not certain this is the case for him.   Past Medical History  Diagnosis Date  . Coronary artery disease   . Angina   . Hypertension   . Anemia     when had stomach ulcer  . Headache(784.0)   . Arthritis   . Abdominal aneurysm   . Disorder of heart muscle     "thickened heart muscle" per wife  . Carotid artery occlusion   . Bursitis, shoulder     left  . Dizzy spells   . Autonomic orthostatic hypotension   . Peripheral vertigo   . Cluster headaches   . Urinary incontinence   . Stroke 1995, 2006    R side weak, slurred speech, exp. asphasia  . Hx of bladder infections     Past Surgical History  Procedure Laterality Date  . Tonsillectomy    . Hip arthroplasty  10/29/2011    Procedure: ARTHROPLASTY BIPOLAR HIP;  Surgeon: Shelda PalMatthew D Olin, MD;  Location: [facility];  Service: Orthopedics;  Laterality: Right;  . Fracture surgery      both hands  . Fracture surgery      Right arm X's 2, back  . Joint replacement  11/03/11    Right hip, Fx from a fall  . Carotid endarterectomy Right   . Hernia repair Left   . Liver biopsy      x2,     VITAL SIGNS BP 142/78  Pulse 72  Ht 6\' 1"  (1.854 m)  Wt 176 lb 12.8 oz (80.196 kg)  BMI 23.33 kg/m2   Patient's Medications  New Prescriptions   No medications on file  Previous Medications   AMLODIPINE (NORVASC) 10 MG TABLET    Take 1 tablet (10 mg total) by mouth daily.   CALCIUM  CARBONATE-VIT D-MIN (CALCIUM 600 + MINERALS PO)    Take 1 tablet by mouth 2 (two) times daily.    CEPHALEXIN (KEFLEX) 500 MG CAPSULE    Take 1 capsule (500 mg total) by mouth 2 (two) times daily. X 4 more days   CLOPIDOGREL (PLAVIX) 75 MG TABLET    Take 1 tablet (75 mg total) by mouth daily.   DOCUSATE SODIUM 100 MG CAPS    Take 100 mg by mouth 2 (two) times daily.   DUTASTERIDE (AVODART) 0.5 MG CAPSULE    Take 0.5 mg by mouth every morning.    FLAVOXATE (URISPAS) 100 MG TABLET    Take 1 tablet (100 mg total) by mouth 3 (three) times daily as needed for bladder spasms.   HYDROCODONE-ACETAMINOPHEN (NORCO/VICODIN) 5-325 MG PER TABLET    Take one tablet by mouth every 4 hours as needed for moderate pain; Take two tablets by mouth every 4 hours as needed for severe pain   MULTIPLE VITAMIN (MULTIVITAMIN WITH MINERALS) TABS TABLET    Take  1 tablet by mouth daily.   MULTIPLE VITAMINS-MINERALS (MACULAR VITAMIN BENEFIT) TABS    Take 1 tablet by mouth daily.   POLYETHYLENE GLYCOL (MIRALAX / GLYCOLAX) PACKET    Take 17 g by mouth daily.    PSYLLIUM (METAMUCIL) 0.52 G CAPSULE    Take 1 capsule (0.52 g total) by mouth daily.   SENNA (SENOKOT) 8.6 MG TABS TABLET    Take 1 tablet (8.6 mg total) by mouth daily. For constipation   SODIUM PHOSPHATE PEDIATRIC (FLEET) 3.5-9.5 GM/59ML ENEMA    Place 66 mLs (1 enema total) rectally daily as needed for severe constipation.   SOLIFENACIN (VESICARE) 5 MG TABLET    Take 5 mg by mouth every morning.    TAMSULOSIN (FLOMAX) 0.4 MG CAPS CAPSULE    Take 0.4 mg by mouth.  Modified Medications   No medications on file  Discontinued Medications   No medications on file    SIGNIFICANT DIAGNOSTIC EXAMS   06-13-13: ct of abdomen and pelvis: 1. Hyperattenuation in the dependent bladder is favored to represent small stones. Early contrast excretion felt less likely.2. Motion and hardware degraded exam. 3. Left nephrolithiasis.4. Irregular hepatic contour, suspicious for cirrhosis.  New hepatic dome lesion which demonstrates peripheral enhancement. Although thiscould represent a hemangioma, given cirrhosis, hepatocellularcarcinoma is a concern. Nonemergent outpatient pre and post contrastabdominal MRI should be considered.5. Progression of abdominal aortic aneurysm with saccular andfusiform components. No surrounding hemorrhage.6.  Possible constipation. 7. Hyperattenuation at the penile base is favored to be vascular.Partially calcified urethral stone is felt less likely.8. Left renal lesion which is not a simple cyst. Recommend attentionon followup MRI.  07-15-13: EKG: normal sinus rhythm   07-15-13: chest x-ray: 1. Poor inspiration secondary to patient condition/body habitus. 2. No cardiomegaly; 3. Minimal pulmonary vascular congestion appears new 4. no pleura effusion. 5. No inflammatory consolidate or suspicious nodule. 6. Mild bronchitis and interstitial pneumonitis is seen on the prior examination (12-19-11) within the mid and lower lungs bilaterally has improved.    07-15-13: cervical spine x-ray; 1. Moderate to severe diffuse osteopenia  2. Mild to moderate osteoarthritis and degenerative spondylosis mid/lower cervical spine. 3. No acute fracture, subluxation, or lytic destructive lesion involving the well visualized osseous structures.   LABS REVIEWED:   06-13-13:wbc 19.1; hgb 16.2; hct 46.2; mcv 90.6; plt 221; glucose 140; bun 14; creat 0.79; k+3.8; na++141 liver normal albumin 3.6  06-25-13: wbc 11.0; hgb 13.9; hct 43.8; mcv 92.2 ;plt 222; glucose 101; bun 16; creat 0.8; k+4.4; na+143    .Review of Systems  Unable to perform ROS    Physical Exam  Constitutional: He appears well-developed and well-nourished. No distress.  Neck: No JVD present.  Cardiovascular: Normal rate and intact distal pulses.  Exam reveals gallop.   Heart rate slightly irregular.   Respiratory: Effort normal and breath sounds normal. No respiratory distress. He has no wheezes.  GI: Soft. Bowel  sounds are normal. He exhibits no distension. There is no tenderness.  Musculoskeletal: He exhibits no edema.  Has chronic right side weakness present. Has left side weakness present as well. Is unable to move right lower extremity  Is having increased difficulty holding head up  Neurological: He is alert.  Skin: Skin is warm and dry. He is not diaphoretic.      ASSESSMENT/ PLAN:  1. Altered mental status; weakness; old cva: his chest x-ray; ekg and cervical spine x-rays have returned.   2. Pneumonitis and bronchitis will being zithromax 500 mg daily for 7  days with florastor twice daily for 7 days as a probiotic.    Since these orders were written his wife has decided that she wants him sent to the ED for further evaluation.   Time spent with patient 50 minutes.

## 2013-07-15 NOTE — Consult Note (Signed)
Neurology Consultation Reason for Consult: Stroke Referring Physician: Horton, C  CC: Difficulty with speech  History is obtained from: Patient   HPI: [name] [name] is a 79 y.o. male with a history of a left MCA stroke in 1995 as well as recurrent urinary tract infections who was in his normal state earlier today but was noticed around 10 AM by his wife to have difficulty speaking. She also noticed that he chokes when given some water.  She asked to see the doctor at his nursing home, but when this was taking too long, [facility] and was therefore transported here. Once here, he received an MRI which confirms a right lenticular infarct  Of note, following his stroke in 1995, he had had good improvement. He had never returned to speaking normally, but had been able to drive with some assistive devices.  LKW: 10 AM tpa given?: no, outside of window    ROS: Unable to obtain due to baseline aphasia and severe dysarthria  Past Medical History  Diagnosis Date  . Coronary artery disease   . Angina   . Hypertension   . Anemia     when had stomach ulcer  . Headache(784.0)   . Arthritis   . Abdominal aneurysm   . Disorder of heart muscle     "thickened heart muscle" per wife  . Carotid artery occlusion   . Bursitis, shoulder     left  . Dizzy spells   . Autonomic orthostatic hypotension   . Peripheral vertigo   . Cluster headaches   . Urinary incontinence   . Stroke 1995, 2006    R side weak, slurred speech, exp. asphasia  . Hx of bladder infections     Family History: No history of strokes or his wife  Social History: Tob: Previous smoker  Exam: Current vital signs: BP 177/75  Pulse 71  Temp(Src) 99.1 F (37.3 C) (Oral)  Resp 18  SpO2 94% Vital signs in last 24 hours: Temp:  [99.1 F (37.3 C)] 99.1 F (37.3 C) (02/05 1817) Pulse Rate:  [70-72] 72 (02/05 2033) Resp:  [16-18] 18 (02/05 1932) BP: (142-183)/(75-98) 142/78 mmHg  (02/05 2033) SpO2:  [94 %-97 %] 94 % (02/05 1932) Weight:  [80.196 kg (176 lb 12.8 oz)] 80.196 kg (176 lb 12.8 oz) (02/05 2033)  General: In bed, NAD CV: Regular rate and rhythm Mental Status: Patient is awake, alert, unable to answer orientation questions do to severe dysarthria He does follow commands briskly and appears to understand and answers with head shakes/nods. Cranial Nerves: II: Visual Fields are limited in the right upper visual field. Pupils are equal, round, and reactive to light.  Discs are difficult to visualize. III,IV, VI: EOMI without ptosis or diploplia.  V: Facial sensation is decreased on right VII: Facial movement is notable for bilateral weakness. VIII: hearing is intact to voice X: Uvula elevates symmetrically XI: Shoulder shrug is symmetric. XII: tongue is midline without atrophy or fasciculations.  Motor: Tone is normal. Bulk is normal. 5/5 strength was present proximally in the left arm, as well as left leg. He has spastic 4/5 weakness of his right arm and leg. He has impaired fine motor control of his left hand. Sensory: Sensation is decreased on the right Deep Tendon Reflexes: 3+ in the biceps and patellae on the right, 2+ on left Cerebellar: Difficulty with finger-nose-finger on the left, unable to perform on the right. Gait: Not tested due to patient  safety concern         I have reviewed labs in epic and the results pertinent to this consultation are: CMP-low albumin, mild hypokalemia  I have reviewed the images obtained: MRI brain-old left MCA  infarct, new right subcortical infarct  Impression: 79 year old male with previous left MCA infarct who presents with new small subcortical infarct. I suspect that he has severe dysarthria accounting for his change in speech. I'm also concerned about his swallowing, and would favor a speech evaluation prior to any by mouth intake.  Recommendations: 1. HgbA1c, fasting lipid panel 2. MRI, MRA  of  the brain without contrast 3. Frequent neuro checks 4. Echocardiogram 5. Carotid dopplers 6. Prophylactic therapy-Antiplatelet med: Aspirin - dose 300 mg per rectum while NPO 7. Risk factor modification 8. Telemetry monitoring 9. PT consult, OT consult, Speech consult    Ritta Slot, MD [facility] [phone]  If 7pm- 7am, please page neurology on call at [phone].

## 2013-07-15 NOTE — ED Notes (Signed)
Per ems--pt from [facility]- family called ems today due to pt not being able to speak since 10am. Pt had cxr today that confirm pneumonia. Pt hx of 2 past stroke with R sided deficients. Pt answers yes and no questions appropriately but just unable to verbalized any words. Hx of UTI's as well. Pt with temp 101 with ems

## 2013-07-15 NOTE — Progress Notes (Signed)
ANTIBIOTIC CONSULT NOTE - INITIAL  Pharmacy Consult for Vancomycin/Cefepime Indication: pneumonia  Allergies  Allergen Reactions  . Oxycontin [Oxycodone Hcl] Other (See Comments)    Severe confusion    Patient Measurements: 80.2 kg  Vital Signs: Temp: 99.1 F (37.3 C) (02/05 1817) Temp src: Oral (02/05 1817) BP: 172/71 mmHg (02/05 2300) Pulse Rate: 75 (02/05 2300)  Labs:  Recent Labs  07/15/13 1828  WBC 10.7*  HGB 14.6  PLT 217  CREATININE 0.85    Medical History: Past Medical History  Diagnosis Date  . Coronary artery disease   . Angina   . Hypertension   . Anemia     when had stomach ulcer  . Headache(784.0)   . Arthritis   . Abdominal aneurysm   . Disorder of heart muscle     "thickened heart muscle" per wife  . Carotid artery occlusion   . Bursitis, shoulder     left  . Dizzy spells   . Autonomic orthostatic hypotension   . Peripheral vertigo   . Cluster headaches   . Urinary incontinence   . Stroke 1995, 2006    R side weak, slurred speech, exp. asphasia  . Hx of bladder infections    Assessment: 79 y/o M here with AMS, CXR with PNA, WBC 10.7, renal function ok, other labs as above. Vancomycin/Cefepime already given in the ED.   Goal of Therapy:  Vancomycin trough level 15-20 mcg/ml  Plan:  -Vancomycin 1000 mg IV q12h -Cefepime 1g IV q12h (already ordered per MD) -Trend WBC, temp, renal function  -F/U any cultures, imaging -Drug levels as indicated  Abran DukeLedford, Kaylea Mounsey 07/15/2013,11:28 PM

## 2013-07-16 ENCOUNTER — Inpatient Hospital Stay (HOSPITAL_COMMUNITY): Payer: Medicare Other

## 2013-07-16 DIAGNOSIS — I517 Cardiomegaly: Secondary | ICD-10-CM

## 2013-07-16 DIAGNOSIS — I639 Cerebral infarction, unspecified: Secondary | ICD-10-CM

## 2013-07-16 DIAGNOSIS — J690 Pneumonitis due to inhalation of food and vomit: Secondary | ICD-10-CM

## 2013-07-16 DIAGNOSIS — J189 Pneumonia, unspecified organism: Secondary | ICD-10-CM

## 2013-07-16 DIAGNOSIS — I10 Essential (primary) hypertension: Secondary | ICD-10-CM

## 2013-07-16 DIAGNOSIS — I6350 Cerebral infarction due to unspecified occlusion or stenosis of unspecified cerebral artery: Secondary | ICD-10-CM

## 2013-07-16 LAB — CBC WITH DIFFERENTIAL/PLATELET
Basophils Absolute: 0 10*3/uL (ref 0.0–0.1)
Basophils Relative: 0 % (ref 0–1)
Eosinophils Absolute: 0.4 10*3/uL (ref 0.0–0.7)
Eosinophils Relative: 4 % (ref 0–5)
HEMATOCRIT: 42 % (ref 39.0–52.0)
HEMOGLOBIN: 14.7 g/dL (ref 13.0–17.0)
LYMPHS ABS: 1.3 10*3/uL (ref 0.7–4.0)
LYMPHS PCT: 12 % (ref 12–46)
MCH: 30.9 pg (ref 26.0–34.0)
MCHC: 35 g/dL (ref 30.0–36.0)
MCV: 88.4 fL (ref 78.0–100.0)
MONO ABS: 0.8 10*3/uL (ref 0.1–1.0)
MONOS PCT: 7 % (ref 3–12)
NEUTROS ABS: 8.1 10*3/uL — AB (ref 1.7–7.7)
Neutrophils Relative %: 76 % (ref 43–77)
Platelets: 200 10*3/uL (ref 150–400)
RBC: 4.75 MIL/uL (ref 4.22–5.81)
RDW: 12.9 % (ref 11.5–15.5)
WBC: 10.6 10*3/uL — ABNORMAL HIGH (ref 4.0–10.5)

## 2013-07-16 LAB — URINE CULTURE: Colony Count: 30000

## 2013-07-16 LAB — LIPID PANEL
CHOL/HDL RATIO: 3.5 ratio
CHOLESTEROL: 150 mg/dL (ref 0–200)
HDL: 43 mg/dL (ref >39)
LDL Cholesterol: 87 mg/dL (ref 0–99)
Triglycerides: 102 mg/dL (ref <150)
VLDL: 20 mg/dL (ref 0–40)

## 2013-07-16 LAB — HEMOGLOBIN A1C
Hgb A1c MFr Bld: 6 % — ABNORMAL HIGH (ref <5.7)
Mean Plasma Glucose: 126 mg/dL — ABNORMAL HIGH (ref <117)

## 2013-07-16 LAB — PROTIME-INR
INR: 1.08 (ref 0.00–1.49)
PROTHROMBIN TIME: 13.8 s (ref 11.6–15.2)

## 2013-07-16 LAB — COMPREHENSIVE METABOLIC PANEL
ALT: 18 U/L (ref 0–53)
AST: 17 U/L (ref 0–37)
Albumin: 3.3 g/dL — ABNORMAL LOW (ref 3.5–5.2)
Alkaline Phosphatase: 84 U/L (ref 39–117)
BILIRUBIN TOTAL: 0.6 mg/dL (ref 0.3–1.2)
BUN: 7 mg/dL (ref 6–23)
CHLORIDE: 106 meq/L (ref 96–112)
CO2: 24 meq/L (ref 19–32)
CREATININE: 0.7 mg/dL (ref 0.50–1.35)
Calcium: 8.6 mg/dL (ref 8.4–10.5)
GFR calc non Af Amer: 89 mL/min — ABNORMAL LOW (ref >90)
GLUCOSE: 113 mg/dL — AB (ref 70–99)
Potassium: 3.5 mEq/L — ABNORMAL LOW (ref 3.7–5.3)
Sodium: 144 mEq/L (ref 137–147)
Total Protein: 7 g/dL (ref 6.0–8.3)

## 2013-07-16 LAB — GLUCOSE, CAPILLARY
Glucose-Capillary: 100 mg/dL — ABNORMAL HIGH (ref 70–99)
Glucose-Capillary: 100 mg/dL — ABNORMAL HIGH (ref 70–99)
Glucose-Capillary: 114 mg/dL — ABNORMAL HIGH (ref 70–99)
Glucose-Capillary: 126 mg/dL — ABNORMAL HIGH (ref 70–99)

## 2013-07-16 LAB — AMMONIA: Ammonia: 28 umol/L (ref 11–60)

## 2013-07-16 LAB — LACTIC ACID, PLASMA: LACTIC ACID, VENOUS: 0.8 mmol/L (ref 0.5–2.2)

## 2013-07-16 MED ORDER — PIPERACILLIN-TAZOBACTAM 3.375 G IVPB
3.3750 g | Freq: Three times a day (TID) | INTRAVENOUS | Status: DC
  Administered 2013-07-16 – 2013-07-21 (×16): 3.375 g via INTRAVENOUS
  Filled 2013-07-16 (×19): qty 50

## 2013-07-16 MED ORDER — WHITE PETROLATUM GEL
Status: AC
Start: 2013-07-16 — End: 2013-07-16
  Administered 2013-07-16: 0.2
  Filled 2013-07-16: qty 5

## 2013-07-16 MED ORDER — ATORVASTATIN CALCIUM 10 MG PO TABS
10.0000 mg | ORAL_TABLET | Freq: Every day | ORAL | Status: DC
  Filled 2013-07-16 (×2): qty 1

## 2013-07-16 MED ORDER — AMLODIPINE BESYLATE 10 MG PO TABS
10.0000 mg | ORAL_TABLET | Freq: Every day | ORAL | Status: DC
  Filled 2013-07-16 (×2): qty 1

## 2013-07-16 MED ORDER — PIPERACILLIN-TAZOBACTAM 3.375 G IVPB 30 MIN: 3.3750 g | Freq: Three times a day (TID) | INTRAVENOUS | Status: DC

## 2013-07-16 MED ORDER — MORPHINE SULFATE 2 MG/ML IJ SOLN
1.0000 mg | INTRAMUSCULAR | Status: DC | PRN
  Administered 2013-07-16 (×2): 1 mg via INTRAVENOUS
  Filled 2013-07-16 (×2): qty 1

## 2013-07-16 MED ORDER — HYDROCODONE-ACETAMINOPHEN 5-325 MG PO TABS: 1.0000 | ORAL_TABLET | ORAL | Status: DC | PRN

## 2013-07-16 NOTE — Progress Notes (Signed)
[facility] PROGRESS NOTE  [name] [name] ZOX:[id number] DOB: 1934/12/22 DOA: 07/15/2013 PCP: Rivka Spring, MD  Assessment/Plan: Acute nonhemorrhagic infarct -MRI brain acute right lenticular nucleus infarct -MRA of brain shows intracranial atherosclerotic vascular disease -Carotid Dopplers shows occluded left internal carotid--this has been chronic and previously known -Echocardiogram shows EF 60-65%, grade 1 diastolic dysfunction, no source of emboli -Discussed with Dr. Myrtie Soman plavix Aspiration pneumonia/HCAP -Swallow evaluation -Discontinue cefepime -Start Zosyn -Discontinue vancomycin the blood cultures remain negative Hypertension -Restart amlodipine home dose Hyperlipidemia -Start Lipitor due to drug interaction with simvastatin and amlodipine -LDL 87 Goals of care  -Wife has requested palliative medicine referral which I have consulted  Pyuria -Continue current antibiotics pending culture data Family Communication:   wife at beside Disposition Plan:   SNF       Procedures/Studies: Dg Chest 1 View  07/15/2013   CLINICAL DATA:  Cerebral vascular accident.  Unable to verbalize.  EXAM: CHEST - 1 VIEW  COMPARISON:  DG CHEST 2 VIEW dated 02/15/2013  FINDINGS: Mildly enlarged cardiac silhouette. There is peripheral airspace disease in right upper lobes consistent with pneumonia. No pleural fluid. No pneumothorax. Vascular clips in the right neck.  IMPRESSION: Right upper lobe pneumonia.   Electronically Signed   By: Genevive Bi M.D.   On: 07/15/2013 20:17   Dg Chest 2 View  07/16/2013   CLINICAL DATA:  Dyspnea and stroke  EXAM: CHEST  2 VIEW  COMPARISON:  DG CHEST 1 VIEW dated 07/15/2013  FINDINGS: The lungs remain mildly hypoinflated. Confluent interstitial density just above the minor fissure on the frontal film appears to lie posteriorly in the inferior aspect of the upper lobe on the lateral film. The cardiac silhouette is mildly enlarged and stable. The  pulmonary vascularity is prominent centrally.  IMPRESSION: There is a stable appearance of the right upper lobe pneumonia. No definite infiltrate elsewhere is demonstrated.   Electronically Signed   By: Geraldina Parrott  [place]   On: 07/16/2013 08:08   Mr Maxine Glenn Head Wo Contrast  07/15/2013   CLINICAL DATA:  Not able to speak since this morning. Two prior strokes with right-sided deficits. Hypertension.  EXAM: MRI HEAD WITHOUT CONTRAST  MRA HEAD WITHOUT CONTRAST  TECHNIQUE: Multiplanar, multiecho pulse sequences of the brain and surrounding structures were obtained without intravenous contrast. Angiographic images of the head were obtained using MRA technique without contrast.  COMPARISON:  02/15/2013 CT.  12/25/2004 MR.  FINDINGS: MRI HEAD FINDINGS  Acute nonhemorrhagic infarct posterior right lenticular nucleus extending into the a posterior right coronal radiata.  Remote large left hemispheric infarct with encephalomalacia and subsequent dilation of the left lateral ventricle. Small amount of blood breakdown products along the periphery of this remote infarct.  Remote coronal radiata infarcts and basal ganglia infarcts.  Small vessel disease type changes.  Tiny area of blood breakdown products right thalamus most likely related to prior hemorrhage ischemia. Otherwise no evidence of intracranial hemorrhage.  Global atrophy without hydrocephalus.  No intracranial mass lesion noted on this unenhanced exam.  Chronically occluded left internal carotid artery. Please see below.  Transverse ligament hypertrophy. Cervical medullary junction, pituitary region, pineal region and orbital structures unremarkable.  MRA HEAD FINDINGS  Chronically occluded left internal carotid artery. Minimal reconstitution of flow to portions of the left middle cerebral artery. The branches which have flow are markedly diminutive size and irregular.  Mild irregularity and narrowing of the supraclinoid segment of the right internal carotid artery.  No  high-grade stenosis of the  right carotid terminus, M1 segment or A1 segment of the right internal carotid artery or right anterior cerebral artery. Partial filling of left anterior cerebral artery A2 segment from the right.  Codominant vertebral arteries with mild narrowing of the distal vertebral artery slightly greater on the left.  Mild narrowing mid aspect of the basilar artery.  Non visualization right posterior inferior cerebellar artery.  Small irregular left anterior inferior cerebellar artery.  Mild irregularity of the left superior cerebellar artery.  No aneurysm noted.  IMPRESSION: Acute nonhemorrhagic infarct posterior right lenticular nucleus extending into the a posterior right coronal radiata.  Remote large left hemispheric infarct.  Chronically occluded left internal carotid artery.  Intracranial atherosclerotic type changes otherwise as detailed above.  Please see above for additional findings.  These results were called by telephone at the time of interpretation on 07/15/2013 at 10:03 PM to Dr. Wilkie Aye , who verbally acknowledged these results.   Electronically Signed   By: Bridgett Larsson M.D.   On: 07/15/2013 22:05   Mr Brain Wo Contrast  07/15/2013   CLINICAL DATA:  Not able to speak since this morning. Two prior strokes with right-sided deficits. Hypertension.  EXAM: MRI HEAD WITHOUT CONTRAST  MRA HEAD WITHOUT CONTRAST  TECHNIQUE: Multiplanar, multiecho pulse sequences of the brain and surrounding structures were obtained without intravenous contrast. Angiographic images of the head were obtained using MRA technique without contrast.  COMPARISON:  02/15/2013 CT.  12/25/2004 MR.  FINDINGS: MRI HEAD FINDINGS  Acute nonhemorrhagic infarct posterior right lenticular nucleus extending into the a posterior right coronal radiata.  Remote large left hemispheric infarct with encephalomalacia and subsequent dilation of the left lateral ventricle. Small amount of blood breakdown products along the periphery  of this remote infarct.  Remote coronal radiata infarcts and basal ganglia infarcts.  Small vessel disease type changes.  Tiny area of blood breakdown products right thalamus most likely related to prior hemorrhage ischemia. Otherwise no evidence of intracranial hemorrhage.  Global atrophy without hydrocephalus.  No intracranial mass lesion noted on this unenhanced exam.  Chronically occluded left internal carotid artery. Please see below.  Transverse ligament hypertrophy. Cervical medullary junction, pituitary region, pineal region and orbital structures unremarkable.  MRA HEAD FINDINGS  Chronically occluded left internal carotid artery. Minimal reconstitution of flow to portions of the left middle cerebral artery. The branches which have flow are markedly diminutive size and irregular.  Mild irregularity and narrowing of the supraclinoid segment of the right internal carotid artery.  No high-grade stenosis of the right carotid terminus, M1 segment or A1 segment of the right internal carotid artery or right anterior cerebral artery. Partial filling of left anterior cerebral artery A2 segment from the right.  Codominant vertebral arteries with mild narrowing of the distal vertebral artery slightly greater on the left.  Mild narrowing mid aspect of the basilar artery.  Non visualization right posterior inferior cerebellar artery.  Small irregular left anterior inferior cerebellar artery.  Mild irregularity of the left superior cerebellar artery.  No aneurysm noted.  IMPRESSION: Acute nonhemorrhagic infarct posterior right lenticular nucleus extending into the a posterior right coronal radiata.  Remote large left hemispheric infarct.  Chronically occluded left internal carotid artery.  Intracranial atherosclerotic type changes otherwise as detailed above.  Please see above for additional findings.  These results were called by telephone at the time of interpretation on 07/15/2013 at 10:03 PM to Dr. Wilkie Aye , who verbally  acknowledged these results.   Electronically Signed   By: Brett Canales  Constance Goltzlson M.D.   On: 07/15/2013 22:05         Subjective: Patient is awake and alert. He is dysarthric. No reports of respiratory distress, diarrhea, vomiting, headache, uncontrolled pain.   Objective: Filed Vitals:   07/16/13 0238 07/16/13 0400 07/16/13 0700 07/16/13 0914  BP: 166/76 173/75 175/76 157/75  Pulse: 67 65 74 87  Temp: 97.8 F (36.6 C) 97.5 F (36.4 C) 97.8 F (36.6 C) 97.8 F (36.6 C)  TempSrc: Oral Oral Oral   Resp: 18 20 20 20   Height:      Weight:      SpO2: 97% 96% 92% 95%    Intake/Output Summary (Last 24 hours) at 07/16/13 1128 Last data filed at 07/16/13 0919  Gross per 24 hour  Intake      0 ml  Output   1700 ml  Net  -1700 ml   Weight change:  Exam:   General:  Pt is alert, follows commands appropriately, not in acute distress  HEENT: No icterus, No thrush,  [place]/AT  Cardiovascular: RRR, S1/S2, no rubs, no gallops  Respiratory: Bilateral scattered rales. No wheezing. Good air movement.  Abdomen: Soft/+BS, non tender, non distended, no guarding  Extremities: trace edema, No lymphangitis, No petechiae, No rashes, no synovitis  Data Reviewed: Basic Metabolic Panel:  Recent Labs Lab 07/15/13 1828 07/16/13 0745  NA 143 144  K 3.5* 3.5*  CL 103 106  CO2 27 24  GLUCOSE 102* 113*  BUN 9 7  CREATININE 0.85 0.70  CALCIUM 9.2 8.6   Liver Function Tests:  Recent Labs Lab 07/15/13 1828 07/16/13 0745  AST 18 17  ALT 19 18  ALKPHOS 89 84  BILITOT 0.4 0.6  PROT 7.3 7.0  ALBUMIN 3.4* 3.3*   No results found for this basename: LIPASE, AMYLASE,  in the last 168 hours  Recent Labs Lab 07/16/13 0010  AMMONIA 28   CBC:  Recent Labs Lab 07/15/13 1828 07/16/13 0745  WBC 10.7* 10.6*  NEUTROABS 6.9 8.1*  HGB 14.6 14.7  HCT 41.5 42.0  MCV 88.9 88.4  PLT 217 200   Cardiac Enzymes: No results found for this basename: CKTOTAL, CKMB, CKMBINDEX, TROPONINI,  in the  last 168 hours BNP: No components found with this basename: POCBNP,  CBG:  Recent Labs Lab 07/16/13 0050 07/16/13 0659  GLUCAP 100* 114*    No results found for this or any previous visit (from the past 240 hour(s)).   Scheduled Meds: . sodium chloride   Intravenous STAT  . aspirin  300 mg Rectal Daily  . ceFEPime (MAXIPIME) IV  1 g Intravenous Q12H  . clopidogrel  75 mg Oral Q breakfast  . enoxaparin (LOVENOX) injection  40 mg Subcutaneous Daily  . pantoprazole (PROTONIX) IV  40 mg Intravenous Q24H  . vancomycin  1,000 mg Intravenous Q12H   Continuous Infusions:    [name] Dowe, DO  [facility] Pager [phone]  If 7PM-7AM, please contact night-coverage [email or web address] Password [email or web address] 07/16/2013, 11:28 AM   LOS: 1 day

## 2013-07-16 NOTE — Evaluation (Signed)
Speech Language Pathology Evaluation Patient Details Name: [name] [name] MRN: [id number] DOB: 10/03/1934 Today's Date: 07/16/2013 Time: 1140-1150 SLP Time Calculation (min): 10 min  Problem List:  Patient Active Problem List   Diagnosis Date Noted  . Acute ischemic stroke 07/16/2013  . Aspiration pneumonia 07/16/2013  . CVA (cerebral infarction) 07/15/2013  . HCAP (healthcare-associated pneumonia) 07/15/2013  . Aspiration into airway 07/15/2013  . Recurrent falls 07/15/2013  . UTI (urinary tract infection) 06/13/2013  . Urinary retention 06/13/2013  . Unspecified essential hypertension 04/07/2013  . Closed fracture of right humerus 04/07/2013  . AKI (acute kidney injury) 04/07/2013  . Abdominal  pain, other specified site 04/07/2013  . Cluster headache 02/16/2013  . Accelerated hypertension 02/16/2013  . Carotid stenosis, asymptomatic 12/24/2011  . Abdominal aneurysm without mention of rupture 12/24/2011  . Aftercare following surgery of the circulatory system, NEC 12/24/2011  . S/P right hip hemi 10/29/2011  . Femoral neck fracture 10/29/2011  . HLD (hyperlipidemia) 10/27/2011  . History of CVA (cerebrovascular accident) 10/27/2011   Past Medical History:  Past Medical History  Diagnosis Date  . Coronary artery disease   . Angina   . Hypertension   . Anemia     when had stomach ulcer  . Headache(784.0)   . Arthritis   . Abdominal aneurysm   . Disorder of heart muscle     "thickened heart muscle" per wife  . Carotid artery occlusion   . Bursitis, shoulder     left  . Dizzy spells   . Autonomic orthostatic hypotension   . Peripheral vertigo   . Cluster headaches   . Urinary incontinence   . Stroke 1995, 2006    R side weak, slurred speech, exp. asphasia  . Hx of bladder infections    Past Surgical History:  Past Surgical History  Procedure Laterality Date  . Tonsillectomy    . Hip arthroplasty  10/29/2011    Procedure: ARTHROPLASTY BIPOLAR HIP;  Surgeon:  Shelda Pal, MD;  Location: [facility] ORS;  Service: Orthopedics;  Laterality: Right;  . Fracture surgery      both hands  . Fracture surgery      Right arm X's 2, back  . Joint replacement  11/03/11    Right hip, Fx from a fall  . Carotid endarterectomy Right   . Hernia repair Left   . Liver biopsy      x2,    HPI:  79 y.o. male with Past medical history of coronary artery disease, hypertension, recurrent CVA 38 & 2006 with expressive aphasia, recurrent fall, dyslipidemia, urinary incontinence. recurrent UTI SNF.  The patient presents with episode of speech difficulty and drop off in the of his mouth that was noted around 10:00.the history was obtained from patient's wife as the patient has aphasia.on 8:30 in the morning the wife saw the patient has fine and at his baseline.when she returned around 10:00 she found that the patient was sitting in his chair, with his head down and drooling from his mouth. He also started having some cough and a chest x-ray was performed there which showed pneumonia.  As per the wife at his baseline the patient is not ambulating and requires significant assistance for his ADL occasionally can speak some words.  MRI Acute nonhemorrhagic infarct posterior right lenticular nucleus extending into the a posterior right coronal radiata. Remote large left hemispheric infarct.  CXR There is a stable appearance of the right upper lobe pneumonia. No definite infiltrate elsewhere is demonstrated.  Wife reported pt. observed to have recent dysphagia with coughing after liquids, expectorating pill from pharynx after several hours.   Assessment / Plan / Recommendation Clinical Impression  Pt. has expressive aphasia at baseline per wife and was able to produce spontaneous words.  Presently wife or SLP have not heard any vocalizations.  He was unable to produce phonation on command during assessment.  Comprehension for basic 1 step commands functional.  SLP will assess higher level  receptive language abilities during diagnostic assessment as well as moderately complex cognitive abilities.    SLP Assessment  Patient needs continued Speech Lanaguage Pathology Services    Follow Up Recommendations  Skilled Nursing facility    Frequency and Duration min 2x/week  2 weeks   Pertinent Vitals/Pain WDL   SLP Goals  SLP Goals Potential to Achieve Goals: Fair Potential Considerations: Cooperation/participation level;Severity of impairments  SLP Evaluation Prior Functioning  Cognitive/Linguistic Baseline: Baseline deficits Baseline deficit details:  (suspect problem solving) Type of Home: House   Cognition  Overall Cognitive Status: Impaired/Different from baseline Arousal/Alertness: Awake/alert Orientation Level:  (Need to further assess) Attention: Sustained Sustained Attention: Impaired Sustained Attention Impairment: Functional basic Memory:  (will assess further) Awareness: Impaired Awareness Impairment: Anticipatory impairment;Emergent impairment Problem Solving: Impaired Problem Solving Impairment: Functional basic Safety/Judgment: Impaired    Comprehension  Auditory Comprehension Overall Auditory Comprehension: Impaired at baseline Yes/No Questions:  (3/3 functional, will assess for moderatly complex comprehens) Commands:  (followed basic one step commands) Visual Recognition/Discrimination Discrimination: Not tested Reading Comprehension Reading Status:  (TBA)    Expression Expression Primary Mode of Expression: Nonverbal - gestures (no vocalizations) Verbal Expression Overall Verbal Expression: Impaired at baseline (increased difficutly acute per wife ) Initiation: Impaired Level of Generative/Spontaneous Verbalization: Word Repetition: Impaired Level of Impairment: Word level Naming: Impairment Responsive:  (not assessed) Confrontation: Not tested Convergent: Not tested Divergent: Not tested Verbal Errors:  (verbal  apraxia) Pragmatics: Impairment Impairments: Abnormal affect (labile) Non-Verbal Means of Communication:  (not observed) Written Expression Dominant Hand: Right Written Expression:  (TBA)   Oral / Motor Oral Motor/Sensory Function Overall Oral Motor/Sensory Function:  (to assess further) Motor Speech Overall Motor Speech: Impaired Phonation:  (no vocalizations)   GO     Breck CoonsLisa Willis Emmalynn Pinkham M.Ed [employer]SLP Pager [phone]  07/16/2013

## 2013-07-16 NOTE — ED Provider Notes (Signed)
I saw and evaluated the patient, reviewed the resident's note and I agree with the findings and plan.    Patient with history of CVA presents with dysarthria and expressive aphasia.  Onset of symptoms 10am.  Patient out of TPA/Code stroke window.  Patient able to follow commands but unable to speak, name, repeat.  MRI with concerns for new acute stroke.  Neurology consulted.  Patient admitted to hospitalist service.  Shon Batonourtney F Telesa Jeancharles, MD 07/16/13 [phone]

## 2013-07-16 NOTE — Progress Notes (Signed)
Message was left for [name] [name] to call palliative medicine team phone to set up meeting for goals of care.  [name] ChannelAlicia Jihad Brownlow, NP Palliative Medicine Team  Pager # [phone] Team Phone # [phone]

## 2013-07-16 NOTE — Evaluation (Signed)
Clinical/Bedside Swallow Evaluation Patient Details  Name: [name] [name] MRN: [id number] Date of Birth: 08/12/1934  Today's Date: 07/16/2013 Time: 1135-1150 SLP Time Calculation (min): 15 min  Past Medical History:  Past Medical History  Diagnosis Date  . Coronary artery disease   . Angina   . Hypertension   . Anemia     when had stomach ulcer  . Headache(784.0)   . Arthritis   . Abdominal aneurysm   . Disorder of heart muscle     "thickened heart muscle" per wife  . Carotid artery occlusion   . Bursitis, shoulder     left  . Dizzy spells   . Autonomic orthostatic hypotension   . Peripheral vertigo   . Cluster headaches   . Urinary incontinence   . Stroke 1995, 2006    R side weak, slurred speech, exp. asphasia  . Hx of bladder infections    Past Surgical History:  Past Surgical History  Procedure Laterality Date  . Tonsillectomy    . Hip arthroplasty  10/29/2011    Procedure: ARTHROPLASTY BIPOLAR HIP;  Surgeon: Shelda PalMatthew D Olin, MD;  Location: [facility];  Service: Orthopedics;  Laterality: Right;  . Fracture surgery      both hands  . Fracture surgery      Right arm X's 2, back  . Joint replacement  11/03/11    Right hip, Fx from a fall  . Carotid endarterectomy Right   . Hernia repair Left   . Liver biopsy      x2,    HPI:  79 y.o. male with Past medical history of coronary artery disease, hypertension, recurrent CVA 451996 & 2006 with expressive aphasia, recurrent fall, dyslipidemia, urinary incontinence. recurrent UTI SNF.  The patient presents with episode of speech difficulty and drop off in the of his mouth that was noted around 10:00.the history was obtained from patient's wife as the patient has aphasia.on 8:30 in the morning the wife saw the patient has fine and at his baseline.when she returned around 10:00 she found that the patient was sitting in his chair, with his head down and drooling from his mouth. He also started having some cough and a chest x-ray was  performed there which showed pneumonia.  As per the wife at his baseline the patient is not ambulating and requires significant assistance for his ADL occasionally can speak some words.  MRI Acute nonhemorrhagic infarct posterior right lenticular nucleus extending into the a posterior right coronal radiata. Remote large left hemispheric infarct.  CXR There is a stable appearance of the right upper lobe pneumonia. No definite infiltrate elsewhere is demonstrated.  Wife reported pt. observed to have recent dysphagia with coughing after liquids, expectorating pill from pharynx after several hours.   Assessment / Plan / Recommendation Clinical Impression  Pt. demonstrated oral and pharyngeal dysphagia and evidence of flacid soft palate/velum due to intermittent snoring-like phonation at baseline.  Suspect pt. will need alternative means of nutrition.  MD prefers performing MBS today to help establish goals of care.  MBS scheduled for today at 1330.      Aspiration Risk  Severe    Diet Recommendation NPO        Other  Recommendations Recommended Consults: MBS Oral Care Recommendations: Oral care Q4 per protocol   Follow Up Recommendations   (TBD)    Frequency and Duration        Pertinent Vitals/Pain WDL      Swallow Study  Oral/Motor/Sensory Function Overall Oral Motor/Sensory Function:  (will assess further)   Ice Chips Ice chips: Not tested   Thin Liquid Thin Liquid: Not tested    Nectar Thick Nectar Thick Liquid: Not tested   Honey Thick Honey Thick Liquid: Not tested   Puree Puree: Impaired Oral Phase Impairments: Reduced lingual movement/coordination;Impaired anterior to posterior transit Oral Phase Functional Implications: Prolonged oral transit;Oral residue;Left anterior spillage (lingual residue) Pharyngeal Phase Impairments: Suspected delayed Swallow;Decreased hyoid-laryngeal movement;Cough - Delayed (effortful swallow)   Solid       Solid: Not tested        Royce Macadamia M.Ed [employer] [phone]  07/16/2013

## 2013-07-16 NOTE — Progress Notes (Signed)
  Echocardiogram 2D Echocardiogram has been performed.  Cathie BeamsGREGORY, Audia Amick 07/16/2013, 10:47 AM

## 2013-07-16 NOTE — Procedures (Signed)
Objective Swallowing Evaluation: Modified Barium Swallowing Study  Patient Details  Name: [name] [name] MRN: [id number] Date of Birth: 02/07/1935  Today's Date: 07/16/2013 Time: 1345-1500 SLP Time Calculation (min): 75 min  Past Medical History:  Past Medical History  Diagnosis Date  . Coronary artery disease   . Angina   . Hypertension   . Anemia     when had stomach ulcer  . Headache(784.0)   . Arthritis   . Abdominal aneurysm   . Disorder of heart muscle     "thickened heart muscle" per wife  . Carotid artery occlusion   . Bursitis, shoulder     left  . Dizzy spells   . Autonomic orthostatic hypotension   . Peripheral vertigo   . Cluster headaches   . Urinary incontinence   . Stroke 1995, 2006    R side weak, slurred speech, exp. asphasia  . Hx of bladder infections    Past Surgical History:  Past Surgical History  Procedure Laterality Date  . Tonsillectomy    . Hip arthroplasty  10/29/2011    Procedure: ARTHROPLASTY BIPOLAR HIP;  Surgeon: Shelda PalMatthew D Olin, MD;  Location: [facility];  Service: Orthopedics;  Laterality: Right;  . Fracture surgery      both hands  . Fracture surgery      Right arm X's 2, back  . Joint replacement  11/03/11    Right hip, Fx from a fall  . Carotid endarterectomy Right   . Hernia repair Left   . Liver biopsy      x2,    HPI:  79 y.o. male with Past medical history of coronary artery disease, hypertension, recurrent CVA 111996 & 2006 with expressive aphasia, recurrent fall, dyslipidemia, urinary incontinence. recurrent UTI SNF.  The patient presents with episode of speech difficulty and drop off in the of his mouth that was noted around 10:00.the history was obtained from patient's wife as the patient has aphasia.on 8:30 in the morning the wife saw the patient has fine and at his baseline.when she returned around 10:00 she found that the patient was sitting in his chair, with his head down and drooling from his mouth. He also started having  some cough and a chest x-ray was performed there which showed pneumonia.  As per the wife at his baseline the patient is not ambulating and requires significant assistance for his ADL occasionally can speak some words.  MRI Acute nonhemorrhagic infarct posterior right lenticular nucleus extending into the a posterior right coronal radiata. Remote large left hemispheric infarct.  CXR There is a stable appearance of the right upper lobe pneumonia. No definite infiltrate elsewhere is demonstrated.  Wife reported pt. observed to have recent dysphagia with coughing after liquids, expectorating pill from pharynx after several hours.     Assessment / Plan / Recommendation Clinical Impression  Dysphagia Diagnosis: Severe oral phase dysphagia Clinical impression: Pt. exhibited severe oral dysphagia evidenced by glossopharygneal IX and vagus X involvement/apraxia resulting in inability to efficiently transit bolus from anterior to posterior oral cavity with assist (dry spoon).  Pt. alert and attempting to propel puree by tilting head. One weak swallow was initated with small amount of applesauce/barium propelled with mild vallecular residue.  Barium was repeatedly suctioned from oral cavity.  Recommend continue NPO status with trial of ST for facilitation of oral ROM.      Treatment Recommendation  Therapy as outlined in treatment plan below    Diet Recommendation NPO  Other  Recommendations Oral Care Recommendations: Oral care Q4 per protocol   Follow Up Recommendations  Skilled Nursing facility    Frequency and Duration min 1 x/week  2 weeks   Pertinent Vitals/Pain WDL          Reason for Referral Objectively evaluate swallowing function   Oral Phase Oral Preparation/Oral Phase Oral Phase: Impaired Oral - Honey Oral - Honey Teaspoon: Weak lingual manipulation;Reduced posterior propulsion;Holding of bolus;Right anterior bolus loss;Incomplete tongue to palate contact;Right pocketing in  lateral sulci;Pocketing in anterior sulcus;Lingual/palatal residue;Decreased velopharyngeal closure Oral - Solids Oral - Puree: Weak lingual manipulation;Reduced posterior propulsion;Holding of bolus;Right anterior bolus loss;Incomplete tongue to palate contact;Right pocketing in lateral sulci;Pocketing in anterior sulcus;Lingual/palatal residue;Decreased velopharyngeal closure   Pharyngeal Phase Pharyngeal Phase Pharyngeal Phase: Impaired Pharyngeal - Honey Pharyngeal - Honey Cup: Pharyngeal residue - valleculae;Pharyngeal residue - pyriform sinuses (tongue base residue)  Cervical Esophageal Phase    GO              Breck Coons Dylyn Mclaren M.Ed [employer] [phone]  07/16/2013

## 2013-07-16 NOTE — Progress Notes (Signed)
UR complete.  Shekita Boyden RN, MSN 

## 2013-07-16 NOTE — Evaluation (Signed)
Physical Therapy Evaluation Patient Details Name: [name] [name] MRN: [id number] DOB: 09/25/1934 Today's Date: 07/16/2013 Time: 1140-1209 PT Time Calculation (min): 29 min  PT Assessment / Plan / Recommendation History of Present Illness  pt presents with speech deficits, swallowing deficits, and generally weak.    Clinical Impression  Pt globally weak and requires extensive A for all mobility.  Pt's wife indicates she would [facility].  Will continue to follow.      PT Assessment  Patient needs continued PT services    Follow Up Recommendations  SNF    Does the patient have the potential to tolerate intense rehabilitation      Barriers to Discharge        Equipment Recommendations  None recommended by PT    Recommendations for Other Services     Frequency Min 3X/week    Precautions / Restrictions Precautions Precautions: Fall Restrictions Weight Bearing Restrictions: No   Pertinent Vitals/Pain Did not indicate pain.        Mobility  Bed Mobility Overal bed mobility: Needs Assistance Bed Mobility: Supine to Sit Supine to sit: Total assist;+2 for physical assistance General bed mobility comments: pt attempts to A with L UE, however very weak and unable to provide much A.   Transfers Overall transfer level: Needs assistance Equipment used: 2 person hand held assist Transfers: Sit to/from [employer];Squat Pivot Transfers Sit to Stand: Total assist;+2 physical assistance Squat pivot transfers: Total assist;+2 physical assistance General transfer comment: Repeated standing x2 working on trying to extend Bil LEs and trunk.  pt unable to come to full standing.  Utilized pad under hips to A with pivot.   Modified Rankin (Stroke Patients Only) Pre-Morbid Rankin Score: Severe disability Modified Rankin: Severe disability    Exercises     PT Diagnosis: Difficulty walking;Generalized weakness  PT Problem List: Decreased strength;Decreased  activity tolerance;Decreased balance;Decreased mobility;Decreased coordination;Decreased knowledge of use of DME PT Treatment Interventions: DME instruction;Gait training;Functional mobility training;Therapeutic activities;Therapeutic exercise;Balance training;Neuromuscular re-education;Patient/family education     PT Goals(Current goals can be found in the care plan section) Acute Rehab PT Goals Patient Stated Goal: [facility] PT Goal Formulation: With family Time For Goal Achievement: 07/30/13 Potential to Achieve Goals: Fair  Visit Information  Last PT Received On: 07/16/13 Assistance Needed: +2 History of Present Illness: pt presents with speech deficits, swallowing deficits, and generally weak.         Prior Functioning  Home Living Family/patient expects to be discharged to:: Skilled nursing facility Type of Home: House Prior Function Level of Independence: Needs assistance Gait / Transfers Assistance Needed: Per wife [facility] and has [facility] short distances.   ADL's / Homemaking Assistance Needed: At SNF pt had been requiring total A for ADLs.   Communication Communication: Expressive difficulties;HOH Dominant Hand: Right    Cognition  Cognition Arousal/Alertness: Awake/alert Behavior During Therapy: Flat affect Overall Cognitive Status: Impaired/Different from baseline Area of Impairment: Following commands Following Commands: Follows one step commands with increased time    Extremity/Trunk Assessment Upper Extremity Assessment Upper Extremity Assessment: Defer to OT evaluation Lower Extremity Assessment Lower Extremity Assessment: RLE deficits/detail;LLE deficits/detail RLE Deficits / Details: Increased tone and decreased strength from old CVA.  Difficulty extending LE 2/2 to tone and foot inverts.   LLE Deficits / Details: Generally weak grossly 2+/5 Cervical / Trunk Assessment Cervical / Trunk  Assessment: Kyphotic  Balance Balance Overall balance assessment: Needs assistance Sitting-balance support: Bilateral upper extremity supported;Feet supported Sitting balance-Leahy Scale: Poor Standing balance support: Bilateral upper extremity supported Standing balance-Leahy Scale: Zero  End of Session PT - End of Session Equipment Utilized During Treatment: Gait belt Activity Tolerance: Patient tolerated treatment well Patient left: in chair;with call bell/phone within reach;with chair alarm set;with family/visitor present Nurse Communication: Mobility status;Need for lift equipment  GP     Sunny Schlein, [place] [phone] 07/16/2013, 2:28 PM

## 2013-07-16 NOTE — Progress Notes (Signed)
Patient returned from Vascular 

## 2013-07-16 NOTE — Progress Notes (Signed)
Stroke Team Progress Note  HISTORY [name] [name] is a 79 y.o. male with a history of a left MCA stroke in 1995 as well as recurrent urinary tract infections who was in his normal state earlier today but was noticed around 10 AM 07/15/2013 by his wife to have difficulty speaking. She also noticed that he chokes when given some water. She asked to see the doctor at his nursing home, but when this was taking too long, she has to be transferred to [facility] come and was therefore transported here. Once here, he received an MRI which confirms a right lenticular infarct  Of note, following his stroke in 1995, he had had good improvement. He had never returned to speaking normally, but had been able to drive with some assistive devices. Patient was not administerd TPA secondary to delay in arrival. He was admitted for further evaluation and treatment.  SUBJECTIVE His wife is at the bedside.  Patient resides at [facility].  He has improved a bit today  OBJECTIVE Most recent Vital Signs: Filed Vitals:   07/16/13 0238 07/16/13 0400 07/16/13 0700 07/16/13 0914  BP: 166/76 173/75 175/76 157/75  Pulse: 67 65 74 87  Temp: 97.8 F (36.6 C) 97.5 F (36.4 C) 97.8 F (36.6 C) 97.8 F (36.6 C)  TempSrc: Oral Oral Oral   Resp: 18 20 20 20   Height:      Weight:      SpO2: 97% 96% 92% 95%   CBG (last 3)   Recent Labs  07/16/13 0050 07/16/13 0659  GLUCAP 100* 114*    IV Fluid Intake:     MEDICATIONS  . sodium chloride   Intravenous STAT  . aspirin  300 mg Rectal Daily  . ceFEPime (MAXIPIME) IV  1 g Intravenous Q12H  . clopidogrel  75 mg Oral Q breakfast  . enoxaparin (LOVENOX) injection  40 mg Subcutaneous Daily  . pantoprazole (PROTONIX) IV  40 mg Intravenous Q24H  . vancomycin  1,000 mg Intravenous Q12H   PRN:  hydrALAZINE, HYDROmorphone (DILAUDID) injection, ondansetron (ZOFRAN) IV, ondansetron (ZOFRAN) IV  Diet:  NPO  Activity:  Bedrest DVT Prophylaxis:  Lovenox 40 mg sq daily    CLINICALLY SIGNIFICANT STUDIES Basic Metabolic Panel:  Recent Labs Lab 07/15/13 1828 07/16/13 0745  NA 143 144  K 3.5* 3.5*  CL 103 106  CO2 27 24  GLUCOSE 102* 113*  BUN 9 7  CREATININE 0.85 0.70  CALCIUM 9.2 8.6   Liver Function Tests:  Recent Labs Lab 07/15/13 1828 07/16/13 0745  AST 18 17  ALT 19 18  ALKPHOS 89 84  BILITOT 0.4 0.6  PROT 7.3 7.0  ALBUMIN 3.4* 3.3*   CBC:  Recent Labs Lab 07/15/13 1828 07/16/13 0745  WBC 10.7* 10.6*  NEUTROABS 6.9 8.1*  HGB 14.6 14.7  HCT 41.5 42.0  MCV 88.9 88.4  PLT 217 200   Coagulation:  Recent Labs Lab 07/16/13 0745  LABPROT 13.8  INR 1.08   Cardiac Enzymes: No results found for this basename: CKTOTAL, CKMB, CKMBINDEX, TROPONINI,  in the last 168 hours Urinalysis:  Recent Labs Lab 07/15/13 1858  COLORURINE YELLOW  LABSPEC 1.012  PHURINE 7.5  GLUCOSEU NEGATIVE  HGBUR TRACE*  BILIRUBINUR NEGATIVE  KETONESUR NEGATIVE  PROTEINUR NEGATIVE  UROBILINOGEN 0.2  NITRITE NEGATIVE  LEUKOCYTESUR LARGE*   Lipid Panel    Component Value Date/Time   CHOL 150 07/16/2013 0240   TRIG 102 07/16/2013 0240   HDL 43 07/16/2013 0240  CHOLHDL 3.5 07/16/2013 0240   VLDL 20 07/16/2013 0240   LDLCALC 87 07/16/2013 0240   HgbA1C  No results found for this basename: HGBA1C    Urine Drug Screen:   No results found for this basename: labopia, cocainscrnur, labbenz, amphetmu, thcu, labbarb    Alcohol Level: No results found for this basename: ETH,  in the last 168 hours  CT of the brain    MRI of the brain  07/15/2013    Acute nonhemorrhagic infarct posterior right lenticular nucleus extending into the a posterior right coronal radiata.  Remote large left hemispheric infarct.    MRA of the brain  07/15/2013    Chronically occluded left internal carotid artery.  Intracranial atherosclerotic type changes   2D Echocardiogram    Carotid Doppler    CXR   07/16/2013    There is a stable appearance of the right upper lobe pneumonia. No  definite infiltrate elsewhere is demonstrated.    07/15/2013    Right upper lobe pneumonia.    EKG  normal sinus rhythm. For complete results please see formal report.   Therapy Recommendations pending  Physical Exam  Elderly caucasian male not in distress.Awake alert. Afebrile. Head is nontraumatic. Neck is supple without bruit.   Cardiac exam no murmur or gallop. Lungs are clear to auscultation. Distal pulses are well felt. Neurological Exam :  Awake alert globally aphasic. Makes some sounds but difficult to understand. Follows midline commands and occasional extremity commands. Left gaze preference but can track visually. Decrease blink to threat on right. Mild right lower face weak. Tongue midline. Spastic old right hemiparesis 3/5 RUE and 4/5 RLE with severe right hand, grip weakness and mild right hip flexor and ankle dorsiflexor weakness. Increased ton eon right side. Normal strength on left side.sensation grossly preserved bilaterally. Coordination impaired on right. Gait deferred. ASSESSMENT [name] is a 79 y.o. male presenting with difficulty speaking and swallowing. Imaging confirms a right lenticular/posterior right coronal radiata subcortical infarct. Infarct felt to be thrombotic secondary to small vessel disease.  On clopidogrel 75 mg orally every day prior to admission. Now ordered clopidogrel 75 mg orally every day and aspirin 300 mg suppository daily for secondary stroke prevention (though pt NPO and only taking aspirin currently). Patient with resultant aphasia and right hemiparesis ( mostly old deficits) Work up underway.   Speech worsening and droolong  Bed bound prior to admission  Probably aspiration pneumonia, on Vanc & cefipime Accelerated hypertension 183/85 Known left carotid artery occlusion  Headaches  Hx L MCA stroke - right sided weakness, slurred speech, expressive aphasia - 1995, 2006  CAD  Hospital day # 1  TREATMENT/PLAN  Continue aspirin  suppository for secondary stroke prevention. Change to Plavix once able to swallow  ST assess swallow  Therapy evals  Mobilize out of bed as tolerated  D/W wife and answered questions.

## 2013-07-16 NOTE — Progress Notes (Signed)
Patient returned from radiology

## 2013-07-16 NOTE — Progress Notes (Signed)
Patient returned from Xray at this time.

## 2013-07-16 NOTE — Progress Notes (Signed)
Patient left the unit for modified barium swallow

## 2013-07-16 NOTE — Progress Notes (Signed)
*  PRELIMINARY RESULTS* Vascular Ultrasound Carotid Duplex (Doppler) has been completed.  Findings suggest 1-39% right internal carotid artery stenosis. The left internal carotid artery is occluded. Vertebral arteries are patent with antegrade flow.  07/16/2013 10:20 AM Gertie FeyMichelle Kadeen Sroka, RVT, RDCS, RDMS

## 2013-07-17 DIAGNOSIS — Z515 Encounter for palliative care: Secondary | ICD-10-CM

## 2013-07-17 DIAGNOSIS — R339 Retention of urine, unspecified: Secondary | ICD-10-CM

## 2013-07-17 DIAGNOSIS — R1310 Dysphagia, unspecified: Secondary | ICD-10-CM

## 2013-07-17 LAB — GLUCOSE, CAPILLARY
GLUCOSE-CAPILLARY: 130 mg/dL — AB (ref 70–99)
GLUCOSE-CAPILLARY: 114 mg/dL — AB (ref 70–99)
Glucose-Capillary: 113 mg/dL — ABNORMAL HIGH (ref 70–99)
Glucose-Capillary: 134 mg/dL — ABNORMAL HIGH (ref 70–99)

## 2013-07-17 LAB — CBC
HCT: 44.7 % (ref 39.0–52.0)
Hemoglobin: 16.2 g/dL (ref 13.0–17.0)
MCH: 31.9 pg (ref 26.0–34.0)
MCHC: 36.2 g/dL — AB (ref 30.0–36.0)
MCV: 88 fL (ref 78.0–100.0)
PLATELETS: 242 10*3/uL (ref 150–400)
RBC: 5.08 MIL/uL (ref 4.22–5.81)
RDW: 13 % (ref 11.5–15.5)
WBC: 14.1 10*3/uL — ABNORMAL HIGH (ref 4.0–10.5)

## 2013-07-17 LAB — BASIC METABOLIC PANEL
BUN: 10 mg/dL (ref 6–23)
CALCIUM: 9.4 mg/dL (ref 8.4–10.5)
CO2: 23 meq/L (ref 19–32)
Chloride: 104 mEq/L (ref 96–112)
Creatinine, Ser: 0.78 mg/dL (ref 0.50–1.35)
GFR calc Af Amer: >90 mL/min (ref >90)
GFR, EST NON AFRICAN AMERICAN: 85 mL/min — AB (ref >90)
GLUCOSE: 122 mg/dL — AB (ref 70–99)
Potassium: 3.2 mEq/L — ABNORMAL LOW (ref 3.7–5.3)
Sodium: 145 mEq/L (ref 137–147)

## 2013-07-17 MED ORDER — POTASSIUM CHLORIDE 2 MEQ/ML IV SOLN
INTRAVENOUS | Status: DC
  Administered 2013-07-17 – 2013-07-20 (×4): via INTRAVENOUS
  Filled 2013-07-17 (×8): qty 1000

## 2013-07-17 NOTE — Progress Notes (Signed)
[facility] PROGRESS NOTE  [name] [name] ZOX:[id number] DOB: [birth year] DOA: 07/15/2013 PCP: Rivka Spring, MD  Assessment/Plan: Acute nonhemorrhagic infarct  -MRI brain acute right lenticular nucleus infarct  -MRA of brain shows intracranial atherosclerotic vascular disease  -Carotid Dopplers shows occluded left internal carotid--this has been chronic and previously known  -Echocardiogram shows EF 60-65%, grade 1 diastolic dysfunction, no source of emboli  -Discussed with Dr. Myrtie Soman plavix  -While the patient is n.p.o. due to his dysphagia, continue aspirin suppository Aspiration pneumonia/HCAP  -Swallow evaluation--failed -MBS--severe oral dysphagia--> remain n.p.o.  -Discontinue cefepime  -Start Zosyn  -Discontinue vancomycin the blood cultures remain negative  Hypertension  -When necessary hydralazine when SBP >180 Hyperlipidemia  -Start Lipitor due to drug interaction with simvastatin and amlodipine  -LDL 87  Goals of care  -discussed with Dr. Ladona Ridgel -DNR Pyuria  -Urine culture unrevealing, not suggestive of UTI Hypernatremia -Start D5 half-normal saline Hypokalemia -Replete -Check magnesium Family Communication: wife at beside  Disposition Plan: SNF   Antibiotics:  Vancomycin 07/16/2013>>>  Zosyn 07/16/13>>>    Procedures/Studies: Dg Chest 1 View  07/15/2013   CLINICAL DATA:  Cerebral vascular accident.  Unable to verbalize.  EXAM: CHEST - 1 VIEW  COMPARISON:  DG CHEST 2 VIEW dated 02/15/2013  FINDINGS: Mildly enlarged cardiac silhouette. There is peripheral airspace disease in right upper lobes consistent with pneumonia. No pleural fluid. No pneumothorax. Vascular clips in the right neck.  IMPRESSION: Right upper lobe pneumonia.   Electronically Signed   By: Genevive Bi M.D.   On: 07/15/2013 20:17   Dg Chest 2 View  07/16/2013   CLINICAL DATA:  Dyspnea and stroke  EXAM: CHEST  2 VIEW  COMPARISON:  DG CHEST 1 VIEW dated 07/15/2013  FINDINGS: The  lungs remain mildly hypoinflated. Confluent interstitial density just above the minor fissure on the frontal film appears to lie posteriorly in the inferior aspect of the upper lobe on the lateral film. The cardiac silhouette is mildly enlarged and stable. The pulmonary vascularity is prominent centrally.  IMPRESSION: There is a stable appearance of the right upper lobe pneumonia. No definite infiltrate elsewhere is demonstrated.   Electronically Signed   By: Celesta Funderburk  [place]   On: 07/16/2013 08:08   Mr Maxine Glenn Head Wo Contrast  07/15/2013   CLINICAL DATA:  Not able to speak since this morning. Two prior strokes with right-sided deficits. Hypertension.  EXAM: MRI HEAD WITHOUT CONTRAST  MRA HEAD WITHOUT CONTRAST  TECHNIQUE: Multiplanar, multiecho pulse sequences of the brain and surrounding structures were obtained without intravenous contrast. Angiographic images of the head were obtained using MRA technique without contrast.  COMPARISON:  02/15/2013 CT.  12/25/2004 MR.  FINDINGS: MRI HEAD FINDINGS  Acute nonhemorrhagic infarct posterior right lenticular nucleus extending into the a posterior right coronal radiata.  Remote large left hemispheric infarct with encephalomalacia and subsequent dilation of the left lateral ventricle. Small amount of blood breakdown products along the periphery of this remote infarct.  Remote coronal radiata infarcts and basal ganglia infarcts.  Small vessel disease type changes.  Tiny area of blood breakdown products right thalamus most likely related to prior hemorrhage ischemia. Otherwise no evidence of intracranial hemorrhage.  Global atrophy without hydrocephalus.  No intracranial mass lesion noted on this unenhanced exam.  Chronically occluded left internal carotid artery. Please see below.  Transverse ligament hypertrophy. Cervical medullary junction, pituitary region, pineal region and orbital structures unremarkable.  MRA HEAD FINDINGS  Chronically occluded left internal carotid  artery. Minimal reconstitution  of flow to portions of the left middle cerebral artery. The branches which have flow are markedly diminutive size and irregular.  Mild irregularity and narrowing of the supraclinoid segment of the right internal carotid artery.  No high-grade stenosis of the right carotid terminus, M1 segment or A1 segment of the right internal carotid artery or right anterior cerebral artery. Partial filling of left anterior cerebral artery A2 segment from the right.  Codominant vertebral arteries with mild narrowing of the distal vertebral artery slightly greater on the left.  Mild narrowing mid aspect of the basilar artery.  Non visualization right posterior inferior cerebellar artery.  Small irregular left anterior inferior cerebellar artery.  Mild irregularity of the left superior cerebellar artery.  No aneurysm noted.  IMPRESSION: Acute nonhemorrhagic infarct posterior right lenticular nucleus extending into the a posterior right coronal radiata.  Remote large left hemispheric infarct.  Chronically occluded left internal carotid artery.  Intracranial atherosclerotic type changes otherwise as detailed above.  Please see above for additional findings.  These results were called by telephone at the time of interpretation on 07/15/2013 at 10:03 PM to Dr. Wilkie Aye , who verbally acknowledged these results.   Electronically Signed   By: Bridgett Larsson M.D.   On: 07/15/2013 22:05   Mr Brain Wo Contrast  07/15/2013   CLINICAL DATA:  Not able to speak since this morning. Two prior strokes with right-sided deficits. Hypertension.  EXAM: MRI HEAD WITHOUT CONTRAST  MRA HEAD WITHOUT CONTRAST  TECHNIQUE: Multiplanar, multiecho pulse sequences of the brain and surrounding structures were obtained without intravenous contrast. Angiographic images of the head were obtained using MRA technique without contrast.  COMPARISON:  02/15/2013 CT.  12/25/2004 MR.  FINDINGS: MRI HEAD FINDINGS  Acute nonhemorrhagic infarct  posterior right lenticular nucleus extending into the a posterior right coronal radiata.  Remote large left hemispheric infarct with encephalomalacia and subsequent dilation of the left lateral ventricle. Small amount of blood breakdown products along the periphery of this remote infarct.  Remote coronal radiata infarcts and basal ganglia infarcts.  Small vessel disease type changes.  Tiny area of blood breakdown products right thalamus most likely related to prior hemorrhage ischemia. Otherwise no evidence of intracranial hemorrhage.  Global atrophy without hydrocephalus.  No intracranial mass lesion noted on this unenhanced exam.  Chronically occluded left internal carotid artery. Please see below.  Transverse ligament hypertrophy. Cervical medullary junction, pituitary region, pineal region and orbital structures unremarkable.  MRA HEAD FINDINGS  Chronically occluded left internal carotid artery. Minimal reconstitution of flow to portions of the left middle cerebral artery. The branches which have flow are markedly diminutive size and irregular.  Mild irregularity and narrowing of the supraclinoid segment of the right internal carotid artery.  No high-grade stenosis of the right carotid terminus, M1 segment or A1 segment of the right internal carotid artery or right anterior cerebral artery. Partial filling of left anterior cerebral artery A2 segment from the right.  Codominant vertebral arteries with mild narrowing of the distal vertebral artery slightly greater on the left.  Mild narrowing mid aspect of the basilar artery.  Non visualization right posterior inferior cerebellar artery.  Small irregular left anterior inferior cerebellar artery.  Mild irregularity of the left superior cerebellar artery.  No aneurysm noted.  IMPRESSION: Acute nonhemorrhagic infarct posterior right lenticular nucleus extending into the a posterior right coronal radiata.  Remote large left hemispheric infarct.  Chronically occluded  left internal carotid artery.  Intracranial atherosclerotic type changes otherwise as detailed above.  Please see above for additional findings.  These results were called by telephone at the time of interpretation on 07/15/2013 at 10:03 PM to Dr. Wilkie Aye , who verbally acknowledged these results.   Electronically Signed   By: Bridgett Larsson M.D.   On: 07/15/2013 22:05   Dg Swallowing Func-speech Pathology  07/16/2013   Breck Coons [place], CCC-SLP     07/16/2013  4:33 PM Objective Swallowing Evaluation: Modified Barium Swallowing Study   Patient Details  Name: [name] [name] MRN: [id number] Date of Birth: February 02, 1935  Today's Date: 07/16/2013 Time: 1345-1500 SLP Time Calculation (min): 75 min  Past Medical History:  Past Medical History  Diagnosis Date  . Coronary artery disease   . Angina   . Hypertension   . Anemia     when had stomach ulcer  . Headache(784.0)   . Arthritis   . Abdominal aneurysm   . Disorder of heart muscle     "thickened heart muscle" per wife  . Carotid artery occlusion   . Bursitis, shoulder     left  . Dizzy spells   . Autonomic orthostatic hypotension   . Peripheral vertigo   . Cluster headaches   . Urinary incontinence   . Stroke 1995, 2006    R side weak, slurred speech, exp. asphasia  . Hx of bladder infections    Past Surgical History:  Past Surgical History  Procedure Laterality Date  . Tonsillectomy    . Hip arthroplasty  10/29/2011    Procedure: ARTHROPLASTY BIPOLAR HIP;  Surgeon: Shelda Pal,  MD;  Location: [facility];  Service: Orthopedics;  Laterality: Right;   . Fracture surgery      both hands  . Fracture surgery      Right arm X's 2, back  . Joint replacement  11/03/11    Right hip, Fx from a fall  . Carotid endarterectomy Right   . Hernia repair Left   . Liver biopsy      x2,    HPI:  79 y.o. 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  recurrent UTI SNF.   The patient presents with episode of speech  difficulty and drop off in the of his mouth that was noted around  10:00.the history was obtained from patient's wife as the patient  has aphasia.on 8:30 in the morning the wife saw the patient has  fine and at his baseline.when she returned around 10:00 she found  that the patient was sitting in his chair, with his head down and  drooling from his mouth. He also started having some cough and a  chest x-ray was performed there which showed pneumonia.  As per  the wife at his baseline the patient is not ambulating and  requires significant assistance for his ADL occasionally can  speak some words.  MRI Acute nonhemorrhagic infarct posterior  right lenticular nucleus extending into the a posterior right  coronal radiata. Remote large left hemispheric infarct.  CXR  There is a stable appearance of the right upper lobe pneumonia.  No definite infiltrate elsewhere is demonstrated.  Wife reported  pt. observed to have recent dysphagia with coughing after  liquids, expectorating pill from pharynx after several hours.     Assessment / Plan / Recommendation Clinical Impression  Dysphagia Diagnosis: Severe oral phase dysphagia Clinical impression: Pt. exhibited severe oral dysphagia  evidenced by glossopharygneal IX and vagus  X involvement/apraxia  resulting in inability to efficiently transit bolus from anterior  to posterior oral cavity with assist (dry spoon).  Pt. alert and  attempting to propel puree by tilting head. One weak swallow was  initated with small amount of applesauce/barium propelled with  mild vallecular residue.  Barium was repeatedly suctioned from  oral cavity.  Recommend continue NPO status with trial of ST for  facilitation of oral ROM.      Treatment Recommendation  Therapy as outlined in treatment plan below    Diet Recommendation NPO        Other  Recommendations Oral Care Recommendations: Oral care Q4  per protocol   Follow Up Recommendations  Skilled  Nursing facility    Frequency and Duration min 1 x/week  2 weeks   Pertinent Vitals/Pain WDL          Reason for Referral Objectively evaluate swallowing function   Oral Phase Oral Preparation/Oral Phase Oral Phase: Impaired Oral - Honey Oral - Honey Teaspoon: Weak lingual manipulation;Reduced  posterior propulsion;Holding of bolus;Right anterior bolus  loss;Incomplete tongue to palate contact;Right pocketing in  lateral sulci;Pocketing in anterior sulcus;Lingual/palatal  residue;Decreased velopharyngeal closure Oral - Solids Oral - Puree: Weak lingual manipulation;Reduced posterior  propulsion;Holding of bolus;Right anterior bolus loss;Incomplete  tongue to palate contact;Right pocketing in lateral  sulci;Pocketing in anterior sulcus;Lingual/palatal  residue;Decreased velopharyngeal closure   Pharyngeal Phase Pharyngeal Phase Pharyngeal Phase: Impaired Pharyngeal - Honey Pharyngeal - Honey Cup: Pharyngeal residue -  valleculae;Pharyngeal residue - pyriform sinuses (tongue base  residue)  Cervical Esophageal Phase    GO              Breck Coons [place] M.Ed CCC-SLP Pager [phone]  07/16/2013         Subjective: Patient is awake and alert. Denies any shortness of breath, chest pain, headache. No reports of vomiting or diarrhea. No respiratory distress.  Objective: Filed Vitals:   07/17/13 0557 07/17/13 0946 07/17/13 1314 07/17/13 1822  BP: 152/74 149/89 148/78 154/84  Pulse: 76 91 101 101  Temp: 97.8 F (36.6 C) 97.6 F (36.4 C) 97.9 F (36.6 C) 98.6 F (37 C)  TempSrc: Oral Axillary Axillary Axillary  Resp: 20 20 20 20   Height:      Weight:      SpO2: 98% 98% 99% 97%    Intake/Output Summary (Last 24 hours) at 07/17/13 1846 Last data filed at 07/17/13 1828  Gross per 24 hour  Intake      0 ml  Output    175 ml  Net   -175 ml   Weight change:  Exam:   General:  Pt is alert, follows commands appropriately, not in acute distress  HEENT: No icterus, No thrush,   [place]/AT  Cardiovascular: RRR, S1/S2, no rubs, no gallops  Respiratory: Scattered upper airway rhonchi  Abdomen: Soft/+BS, non tender, non distended, no guarding  Extremities: No edema, No lymphangitis, No petechiae, No rashes, no synovitis  Data Reviewed: Basic Metabolic Panel:  Recent Labs Lab 07/15/13 1828 07/16/13 0745 07/17/13 0403  NA 143 144 145  K 3.5* 3.5* 3.2*  CL 103 106 104  CO2 27 24 23   GLUCOSE 102* 113* 122*  BUN 9 7 10   CREATININE 0.85 0.70 0.78  CALCIUM 9.2 8.6 9.4   Liver Function Tests:  Recent Labs Lab 07/15/13 1828 07/16/13 0745  AST 18 17  ALT 19 18  ALKPHOS 89 84  BILITOT 0.4 0.6  PROT 7.3 7.0  ALBUMIN 3.4*  3.3*   No results found for this basename: LIPASE, AMYLASE,  in the last 168 hours  Recent Labs Lab 07/16/13 0010  AMMONIA 28   CBC:  Recent Labs Lab 07/15/13 1828 07/16/13 0745 07/17/13 0403  WBC 10.7* 10.6* 14.1*  NEUTROABS 6.9 8.1*  --   HGB 14.6 14.7 16.2  HCT 41.5 42.0 44.7  MCV 88.9 88.4 88.0  PLT 217 200 242   Cardiac Enzymes: No results found for this basename: CKTOTAL, CKMB, CKMBINDEX, TROPONINI,  in the last 168 hours BNP: No components found with this basename: POCBNP,  CBG:  Recent Labs Lab 07/16/13 1652 07/17/13 0003 07/17/13 0527 07/17/13 1143 07/17/13 1741  GLUCAP 100* 113* 130* 114* 134*    Recent Results (from the past 240 hour(s))  URINE CULTURE     Status: None   Collection Time    07/15/13  6:58 PM      Result Value Range Status   Specimen Description URINE, CLEAN CATCH   Final   Special Requests NONE   Final   Culture  Setup Time     Final   Value: 07/16/2013 00:58     [facility]formed [facility]   Colony Count     Final   Value: 30,000 COLONIES/ML     [facility]formed at [facility]   Culture     Final   Value: Multiple bacterial morphotypes present, none predominant. Suggest appropriate recollection if clinically indicated.     Performed at [facility]   Report  Status 07/16/2013 FINAL   Final  CULTURE, BLOOD (ROUTINE X 2)     Status: None   Collection Time    07/15/13  7:03 PM      Result Value Range Status   Specimen Description BLOOD LEFT FOREARM   Final   Special Requests BOTTLES DRAWN AEROBIC ONLY 8CC   Final   Culture  Setup Time     Final   Value: 07/16/2013 00:37     Performed at [facility]   Culture     Final   Value:        BLOOD CULTURE RECEIVED NO GROWTH TO DATE CULTURE WILL BE HELD FOR 5 DAYS BEFORE ISSUING A FINAL NEGATIVE REPORT     Performed at [facility]   Report Status PENDING   Incomplete  CULTURE, BLOOD (ROUTINE X 2)     Status: None   Collection Time    07/15/13  7:23 PM      Result Value Range Status   Specimen Description BLOOD HAND LEFT   Final   Special Requests     Final   Value: BOTTLES DRAWN AEROBIC AND ANAEROBIC 10CCBLUE 8CCRED   Culture  Setup Time     Final   Value: 07/16/2013 00:38     Performed at [facility]   Culture     Final   Value:        BLOOD CULTURE RECEIVED NO GROWTH TO DATE CULTURE WILL BE HELD FOR 5 DAYS BEFORE ISSUING A FINAL NEGATIVE REPORT     Performed at [facility]   Report Status PENDING   Incomplete     Scheduled Meds: . aspirin  300 mg Rectal Daily  . enoxaparin (LOVENOX) injection  40 mg Subcutaneous Daily  . pantoprazole (PROTONIX) IV  40 mg Intravenous Q24H  . piperacillin-tazobactam (ZOSYN)  IV  3.375 g Intravenous Q8H  . vancomycin  1,000 mg Intravenous Q12H   Continuous Infusions: .  dextrose 5 % and 0.45% NaCl 1,000 mL with potassium chloride 20 mEq infusion       Kemyah Buser, DO  [facility] Pager [phone]  If 7PM-7AM, please contact night-coverage [email or web address] Password [facility] 07/17/2013, 6:46 PM   LOS: 2 days

## 2013-07-17 NOTE — Progress Notes (Signed)
Stroke Team Progress Note  HISTORY [name] [name] is a 79 y.o. male with a history of a left MCA stroke in 1995 as well as recurrent urinary tract infections who was in his normal state on 07/15/2013 but was noticed around 10 AM that day by his wife to have difficulty speaking. She also noticed that he choked when given some water.  She asked to see the doctor at his nursing home, but when this was taking too long, she asked to be transferred to [facility] and was therefore transported here. Once here, he received an MRI which confirmed a right lenticular infarct  Of note, following his stroke in 1995, he had had good improvement. He had never returned to speaking normally, but had been able to drive with some assistive devices.  LKW: 10 AM 07/15/2013 tpa given?: no, outside of window   SUBJECTIVE The patient's wife is present this morning. The patient is completely aphasic. She reported that the patient received morphine last night and became agitated. Today the patient appears alert although he is making heavy snoring sounds while breathing.   OBJECTIVE Most recent Vital Signs: Filed Vitals:   07/16/13 1802 07/16/13 2120 07/17/13 0108 07/17/13 0557  BP: 167/66 148/87 163/76 152/74  Pulse: 85 90 73 76  Temp: 98.3 F (36.8 C) 97.7 F (36.5 C) 97.7 F (36.5 C) 97.8 F (36.6 C)  TempSrc: Oral Oral Oral Oral  Resp: 20 20 20 20   Height:      Weight:      SpO2: 98% 96% 96% 98%   CBG (last 3)   Recent Labs  07/16/13 1652 07/17/13 0003 07/17/13 0527  GLUCAP 100* 113* 130*    IV Fluid Intake:     MEDICATIONS  . amLODipine  10 mg Oral Daily  . aspirin  300 mg Rectal Daily  . atorvastatin  10 mg Oral q1800  . clopidogrel  75 mg Oral Q breakfast  . enoxaparin (LOVENOX) injection  40 mg Subcutaneous Daily  . pantoprazole (PROTONIX) IV  40 mg Intravenous Q24H  . piperacillin-tazobactam (ZOSYN)  IV  3.375 g Intravenous Q8H  . vancomycin  1,000 mg Intravenous Q12H   PRN:   hydrALAZINE, morphine injection, ondansetron (ZOFRAN) IV  Diet:  NPO no liquids Activity:  Up with assistance DVT Prophylaxis:  Lovenox  CLINICALLY SIGNIFICANT STUDIES Basic Metabolic Panel:  Recent Labs Lab 07/16/13 0745 07/17/13 0403  NA 144 145  K 3.5* 3.2*  CL 106 104  CO2 24 23  GLUCOSE 113* 122*  BUN 7 10  CREATININE 0.70 0.78  CALCIUM 8.6 9.4   Liver Function Tests:  Recent Labs Lab 07/15/13 1828 07/16/13 0745  AST 18 17  ALT 19 18  ALKPHOS 89 84  BILITOT 0.4 0.6  PROT 7.3 7.0  ALBUMIN 3.4* 3.3*   CBC:  Recent Labs Lab 07/15/13 1828 07/16/13 0745 07/17/13 0403  WBC 10.7* 10.6* 14.1*  NEUTROABS 6.9 8.1*  --   HGB 14.6 14.7 16.2  HCT 41.5 42.0 44.7  MCV 88.9 88.4 88.0  PLT 217 200 242   Coagulation:  Recent Labs Lab 07/16/13 0745  LABPROT 13.8  INR 1.08   Cardiac Enzymes: No results found for this basename: CKTOTAL, CKMB, CKMBINDEX, TROPONINI,  in the last 168 hours Urinalysis:  Recent Labs Lab 07/15/13 1858  COLORURINE YELLOW  LABSPEC 1.012  PHURINE 7.5  GLUCOSEU NEGATIVE  HGBUR TRACE*  BILIRUBINUR NEGATIVE  KETONESUR NEGATIVE  PROTEINUR NEGATIVE  UROBILINOGEN 0.2  NITRITE NEGATIVE  LEUKOCYTESUR LARGE*   Lipid Panel    Component Value Date/Time   CHOL 150 07/16/2013 0240   TRIG 102 07/16/2013 0240   HDL 43 07/16/2013 0240   CHOLHDL 3.5 07/16/2013 0240   VLDL 20 07/16/2013 0240   LDLCALC 87 07/16/2013 0240   HgbA1C  Lab Results  Component Value Date   HGBA1C 6.0* 07/16/2013    Urine Drug Screen:   No results found for this basename: labopia, cocainscrnur, labbenz, amphetmu, thcu, labbarb    Alcohol Level: No results found for this basename: ETH,  in the last 168 hours  Dg Chest 1 View 07/15/2013    Right upper lobe pneumonia.    Dg Chest 2 View 07/16/2013    There is a stable appearance of the right upper lobe pneumonia. No definite infiltrate elsewhere is demonstrated.      MRI / Mra Head Wo Contrast 07/15/2013    Acute  nonhemorrhagic infarct posterior right lenticular nucleus extending into the a posterior right coronal radiata.  Remote large left hemispheric infarct.  Chronically occluded left internal carotid artery.  Intracranial atherosclerotic type changes.   Dg Swallowing Func-speech Pathology 07/16/2013   Recommend continue NPO status with trial of ST for facilitation of oral ROM.   Treatment Recommendation  Therapy as outlined in treatment plan below    Diet Recommendation NPO   Other  Recommendations Oral Care Recommendations: Oral care Q4  per protocol Follow Up Recommendations  Skilled Nursing facility    Frequency and Duration min 1 x/week  2 weeks     CT of the brain    2D Echocardiogram  ejection fraction 60-65%. No cardiac source of emboli was identified.  Carotid Doppler  Findings suggest 1-39% right internal carotid artery stenosis. The left internal carotid artery is occluded. Vertebral arteries are patent with antegrade flow.   EKG  sinus rhythm rate 72 beats per minute. For complete results please see formal report.   Therapy Recommendations - skilled nursing facility recommended.  Physical Exam    Mental Status:  Patient is awake, alert, unable to answer orientation questions do to severe dysarthria  He does follow commands briskly and appears to understand and answers with head shakes/nods.  Cranial Nerves:  II: Visual Fields are limited in the right upper visual field. Pupils are equal, round, and reactive to light. Discs are difficult to visualize.  III,IV, VI: EOMI without ptosis or diploplia.  V: Facial sensation is decreased on right  VII: Facial movement is notable for bilateral weakness.  VIII: hearing is intact to voice  X: Uvula elevates symmetrically  XI: Shoulder shrug is symmetric.  XII: tongue is midline without atrophy or fasciculations.  Motor:  Tone is normal. Bulk is normal. 5/5 strength was present proximally in the left arm, as well as left leg. He has spastic  4/5 weakness of his right arm and leg. He has impaired fine motor control of his left hand.  Sensory:  Sensation is decreased on the right  Deep Tendon Reflexes:  3+ in the biceps and patellae on the right, 2+ on left  Cerebellar:  Difficulty with finger-nose-finger on the left, unable to perform on the right.  Gait:  Not tested due to patient safety concern   ASSESSMENT [name] is a 79 y.o. male presenting with difficulty speaking and swallowing. TPA was not administered as the patient was outside the window for treatment . An MRI revealed an acute nonhemorrhagic infarct posterior right lenticular nucleus extending into the  a posterior right coronal radiata.  Remote large left hemispheric infarct.  Chronically occluded left internal carotid artery. Infarct felt to be thrombotic. On Plavix 75 mg daily prior to admission. Now on aspirin 325 mg orally every day and clopidogrel 75 mg orally every day for secondary stroke prevention. Patient with resultant severe dysarthria, dysphagia, and impaired fine motor movement of the left hand. Work up underway.   Right lower lobe pneumonia  NPO  Leukocytosis  Previous LMCA CVA  Hospital day # 2  TREATMENT/PLAN  Continue aspirin 325 mg orally every day and clopidogrel 75 mg orally every day for secondary stroke prevention. Change aspirin to 81 mg daily when able to take PO meds.  Skilled nursing facility recommended by the therapists  May need placement of a gastrostomy tube.   Palliative medicine team to me with the family.   Delton See PA-C [facility] Pager [phone] 07/17/2013, 8:36 AM  I have personally obtained a history, examined the patient, evaluated imaging results, and formulated the assessment and plan of care. I agree with the above.  Pt is now DNR. Appreciate time of palliative team. Con't dual antiplatelet. Will need to consider NG tube placement

## 2013-07-17 NOTE — Progress Notes (Addendum)
Patient [name] [name]      DOB: 1934/07/08      KVQ:[id number]  Summary of Goals of Care; full note to follow:  Met with patient's spouse and reviewed advanced directives.  Patient aphasic and unable to participate.  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  She initially wanted to focus on Feeding tube conversations without recognizing what severe distress that Phillip Heal was in.  I aggressively suction Phillip Heal and was able to bring some relief to the point that he was able to sleep for the better part of the interview.  I was able to help [name] see that he was in grave distress and that their was a risk for him to have cardiac or respiratory arrest in his current state and end up on life support.  She was able to recognize that he did not what that and then produced a yellow DNr form.  I believe that she was afraid that if he was do not resuscitate that we would not try to treat him or improve his condition.  She understands that we will continue to treat and try to give him a chance to recover if he can .  She also understands that he will not be able to eat for the next few days and that if they decide to put a feeding tube in his stomach that he can still have severe aspiration, pneumonia and death.  For now she would like to see how he responds to antibiotics and she if he stabilizes at all before putting a feeding tube in.  [facility]' Advanced directives gives permission to discontinue tube feeds but does request initiating them if they will promote quality and dignity.  He does not want to have a prolonged life on tube feeds and [name] understands that.  He has recovered before from his strokes but she realizes that this one may be different.   Recommend :  1. Change to DNR, but continue aggressive suctioning, antibiotics etc.  2.  Aspiration Pneumonia:  Continue antibiotics for now.  3.  Stroke continue aspirin suppository .  I don't think he has been able to take po pills, I  do not think a panda would work well as he is in distress at present with too many secretions .  The question would be would a short term PEG be warranted.  4.  Please note I have discontinue the prn morphine.  This made his air hunger and agitation worse because of how decompensated he is.  If we need opiates will likely need to jump to palliative sedation and not use an in between status.   Total time 200 pm 344 pm   Devin Foskey L. Lovena Le, MD MBA The Palliative Medicine Team at [facility] Phone: [phone] Pager: [phone]

## 2013-07-17 NOTE — Consult Note (Signed)
Patient ZO:XWRUEA MONTREAL [name]      DOB: 09/28/1934      [id number]     Consult Note from the Palliative Medicine Team at [facility]    Consult Requested by:  Dr. Arbutus Leas     PCP: Rivka Spring, MD Reason for Consultation: GOc    Phone Number:[phone]  Assessment of patients Current state: 78 year found at his SNF with inability to talk and respiratory distress.  Patient ultimately taken to hospital where he was found to have new stroke, and right upper lobe pneumonia.  Please see my note from date of service .  Spouse had been hesitant to declare DNR which was already existing.  We discussed his current distress and that he could require mechanical ventilation at any time if he worsened .  She elected DNR but wants to continue to treat aggressively and may elect a feeding tube depending on how he does despite understanding the risks. They have come through two other strokes but [name] [name] admits this one is different.   Goals of Care: 1.  Code Status: DNr   2. Scope of Treatment: Continue antibiotics, aspirin .  Wife understands he will not feed this weekend . Will reassess functional status on Monday  4. Disposition: to be determined   3. Symptom Management: 1. Agitation: attempt to keep airway clear.  Do not use sedating medications unless he takes a worse turn and this is discussed with his spouse  4. Psychosocial: Worked as Production designer, theatre/television/film.  Married [name] [name] after 10 yr engagement of sorts.  5. Spiritual: Spiritual care offered as needed.       Patient Documents Completed or Given: Document Given Completed  Advanced Directives Pkt    MOST    DNR    Gone from My Sight    Hard Choices      Brief HPI: 79 year old white male with history of two prior strokes which left him with reading and writing deficits presents with inability to speak on Thursday and dyspnea.  We were asked to asssit with goals of  Care.   ROS: unable due to expressive aphasia    PMH:  Past Medical History   Diagnosis Date  . Coronary artery disease   . Angina   . Hypertension   . Anemia     when had stomach ulcer  . Headache(784.0)   . Arthritis   . Abdominal aneurysm   . Disorder of heart muscle     "thickened heart muscle" per wife  . Carotid artery occlusion   . Bursitis, shoulder     left  . Dizzy spells   . Autonomic orthostatic hypotension   . Peripheral vertigo   . Cluster headaches   . Urinary incontinence   . Stroke 1995, 2006    R side weak, slurred speech, exp. asphasia  . Hx of bladder infections      PSH: Past Surgical History  Procedure Laterality Date  . Tonsillectomy    . Hip arthroplasty  10/29/2011    Procedure: ARTHROPLASTY BIPOLAR HIP;  Surgeon: Shelda Pal, MD;  Location: [facility];  Service: Orthopedics;  Laterality: Right;  . Fracture surgery      both hands  . Fracture surgery      Right arm X's 2, back  . Joint replacement  11/03/11    Right hip, Fx from a fall  . Carotid endarterectomy Right   . Hernia repair Left   . Liver biopsy  x2,    I have reviewed the FH and SH and  If appropriate update it with new information. Allergies  Allergen Reactions  . Oxycontin [Oxycodone Hcl] Other (See Comments)    Severe confusion   Scheduled Meds: . amLODipine  10 mg Oral Daily  . aspirin  300 mg Rectal Daily  . atorvastatin  10 mg Oral q1800  . clopidogrel  75 mg Oral Q breakfast  . enoxaparin (LOVENOX) injection  40 mg Subcutaneous Daily  . pantoprazole (PROTONIX) IV  40 mg Intravenous Q24H  . piperacillin-tazobactam (ZOSYN)  IV  3.375 g Intravenous Q8H  . vancomycin  1,000 mg Intravenous Q12H   Continuous Infusions:  PRN Meds:.hydrALAZINE, ondansetron (ZOFRAN) IV    BP 148/78  Pulse 101  Temp(Src) 97.9 F (36.6 C) (Axillary)  Resp 20  Ht 6\' 2"  (1.88 m)  Wt 76.975 kg (169 lb 11.2 oz)  BMI 21.78 kg/m2  SpO2 99%   PPS:20% No intake or output data in the 24 hours ending 07/17/13 1547   Physical Exam:  General:  Severe  snoring, patient awake and agitated, copious secretions HEENT:   Pupils appear equal and round, mouth wide open , serious mouth breathing, dry mucous membranes Chest:   Decreased but with upper airway rhonchi CVS: tachy, s1, s2  Abdomen:soft,not distended no grimace on palpation Ext: thin, with some bumps and bruises, moving all ext. Right upper ext contracted slightly absence of first finger Neuro: expressive aphasia, not able to follow commands ,  Shook head for wife but not me related to some questions. Severe mouth breathing, not closing mouth, agitated  Labs: CBC    Component Value Date/Time   WBC 14.1* 07/17/2013 0403   RBC 5.08 07/17/2013 0403   HGB 16.2 07/17/2013 0403   HCT 44.7 07/17/2013 0403   PLT 242 07/17/2013 0403   MCV 88.0 07/17/2013 0403   MCH 31.9 07/17/2013 0403   MCHC 36.2* 07/17/2013 0403   RDW 13.0 07/17/2013 0403   LYMPHSABS 1.3 07/16/2013 0745   MONOABS 0.8 07/16/2013 0745   EOSABS 0.4 07/16/2013 0745   BASOSABS 0.0 07/16/2013 0745       CMP     Component Value Date/Time   NA 145 07/17/2013 0403   K 3.2* 07/17/2013 0403   CL 104 07/17/2013 0403   CO2 23 07/17/2013 0403   GLUCOSE 122* 07/17/2013 0403   BUN 10 07/17/2013 0403   CREATININE 0.78 07/17/2013 0403   CALCIUM 9.4 07/17/2013 0403   PROT 7.0 07/16/2013 0745   ALBUMIN 3.3* 07/16/2013 0745   AST 17 07/16/2013 0745   ALT 18 07/16/2013 0745   ALKPHOS 84 07/16/2013 0745   BILITOT 0.6 07/16/2013 0745   GFRNONAA 85* 07/17/2013 0403   GFRAA >90 07/17/2013 0403    Chest Xray Reviewed/Impressions:Right upper lobe pneumonia.  MRI scan of the Head Reviewed/Impressions:   Acute nonhemorrhagic infarct posterior right lenticular nucleus  extending into the a posterior right coronal radiata.  Remote large left hemispheric infarct.  Chronically occluded left internal carotid artery.  Intracranial atherosclerotic type changes otherwise as detailed  above.  Please see above for additional findings.  These results were called by telephone at the time  of interpretation  on 07/15/2013 at 10:03 PM to Dr. Wilkie Aye , who verbally acknowledged  these results.    Recent CT Abd: 1/20151. Hyperattenuation in the dependent bladder is favored to represent  small stones. Early contrast excretion felt less likely.  2. Motion and hardware degraded exam.  3. Left nephrolithiasis.  4. Irregular hepatic contour, suspicious for cirrhosis. New hepatic  dome lesion which demonstrates peripheral enhancement. Although this  could represent a hemangioma, given cirrhosis, hepatocellular  carcinoma is a concern. Nonemergent outpatient pre and post contrast  abdominal MRI should be considered.  5. Progression of abdominal aortic aneurysm with saccular and  fusiform components. No surrounding hemorrhage.  6. Possible constipation.  7. Hyperattenuation at the penile base is favored to be vascular.  Partially calcified urethral stone is felt less likely.  8. Left renal lesion which is not a simple cyst. Recommend attention  on followup MRI   Time In Time Out Total Time Spent with Patient Total Overall Time  200 pm 344 pm 104 min 104 min   Discussed with Dr. Arbutus Leasat  Greater than 50%  of this time was spent counseling and coordinating care related to the above assessment and plan.  Calbert Hulsebus L. Ladona Ridgelaylor, MD MBA The Palliative Medicine Team at [facility] Team Phone: [phone] Pager: [phone]

## 2013-07-18 ENCOUNTER — Inpatient Hospital Stay (HOSPITAL_COMMUNITY): Payer: Medicare Other

## 2013-07-18 DIAGNOSIS — T17908A Unspecified foreign body in respiratory tract, part unspecified causing other injury, initial encounter: Secondary | ICD-10-CM

## 2013-07-18 DIAGNOSIS — N179 Acute kidney failure, unspecified: Secondary | ICD-10-CM

## 2013-07-18 LAB — GLUCOSE, CAPILLARY
GLUCOSE-CAPILLARY: 157 mg/dL — AB (ref 70–99)
GLUCOSE-CAPILLARY: 160 mg/dL — AB (ref 70–99)
Glucose-Capillary: 133 mg/dL — ABNORMAL HIGH (ref 70–99)
Glucose-Capillary: 129 mg/dL — ABNORMAL HIGH (ref 70–99)

## 2013-07-18 LAB — BASIC METABOLIC PANEL
BUN: 22 mg/dL (ref 6–23)
CHLORIDE: 104 meq/L (ref 96–112)
CO2: 23 mEq/L (ref 19–32)
CREATININE: 1.98 mg/dL — AB (ref 0.50–1.35)
Calcium: 9.3 mg/dL (ref 8.4–10.5)
GFR calc non Af Amer: 31 mL/min — ABNORMAL LOW (ref >90)
GFR, EST AFRICAN AMERICAN: 36 mL/min — AB (ref >90)
GLUCOSE: 132 mg/dL — AB (ref 70–99)
POTASSIUM: 3.6 meq/L — AB (ref 3.7–5.3)
Sodium: 144 mEq/L (ref 137–147)

## 2013-07-18 LAB — MAGNESIUM: Magnesium: 2 mg/dL (ref 1.5–2.5)

## 2013-07-18 LAB — CBC
HEMATOCRIT: 43.8 % (ref 39.0–52.0)
HEMOGLOBIN: 15.6 g/dL (ref 13.0–17.0)
MCH: 31.2 pg (ref 26.0–34.0)
MCHC: 35.6 g/dL (ref 30.0–36.0)
MCV: 87.6 fL (ref 78.0–100.0)
Platelets: 254 10*3/uL (ref 150–400)
RBC: 5 MIL/uL (ref 4.22–5.81)
RDW: 13.2 % (ref 11.5–15.5)
WBC: 17.9 10*3/uL — ABNORMAL HIGH (ref 4.0–10.5)

## 2013-07-18 LAB — VANCOMYCIN, TROUGH: VANCOMYCIN TR: 26.1 ug/mL — AB (ref 10.0–20.0)

## 2013-07-18 MED ORDER — VANCOMYCIN HCL IN DEXTROSE 1-5 GM/200ML-% IV SOLN: 1000.0000 mg | INTRAVENOUS | Status: DC

## 2013-07-18 NOTE — Progress Notes (Signed)
Stroke Team Progress Note  HISTORY [name] [name] is a 79 y.o. male with a history of a left MCA stroke in 1995 as well as recurrent urinary tract infections who was in his normal state on 07/15/2013 but was noticed around 10 AM that day by his wife to have difficulty speaking. She also noticed that he choked when given some water.  She asked to see the doctor at his nursing home, but when this was taking too long, she asked to be transferred to [facility] and was therefore transported here. Once here, he received an MRI which confirmed a right lenticular infarct  Of note, following his stroke in 1995, he had had good improvement. He had never returned to speaking normally, but had been able to drive with some assistive devices.   LKW: 10 AM 07/15/2013 tpa given?: no, outside of window   SUBJECTIVE The patient's wife is not present this morning. The patient appears more alert although he is still a phasic.   OBJECTIVE Most recent Vital Signs: Filed Vitals:   07/17/13 1314 07/17/13 1822 07/17/13 2128 07/18/13 0111  BP: 148/78 154/84 170/89 121/84  Pulse: 101 101 102 96  Temp: 97.9 F (36.6 C) 98.6 F (37 C) 97.5 F (36.4 C) 98 F (36.7 C)  TempSrc: Axillary Axillary Axillary Axillary  Resp: 20 20 20 20   Height:      Weight:      SpO2: 99% 97% 95% 94%   CBG (last 3)   Recent Labs  07/17/13 1741 07/18/13 0018 07/18/13 0549  GLUCAP 134* 160* 133*    IV Fluid Intake:   . dextrose 5 % and 0.45% NaCl 1,000 mL with potassium chloride 20 mEq infusion 75 mL/hr at 07/17/13 2029    MEDICATIONS  . aspirin  300 mg Rectal Daily  . enoxaparin (LOVENOX) injection  40 mg Subcutaneous Daily  . pantoprazole (PROTONIX) IV  40 mg Intravenous Q24H  . piperacillin-tazobactam (ZOSYN)  IV  3.375 g Intravenous Q8H  . vancomycin  1,000 mg Intravenous Q12H   PRN:  hydrALAZINE, ondansetron (ZOFRAN) IV  Diet:  NPO no liquids Activity:  Up with assistance DVT Prophylaxis:   Lovenox  CLINICALLY SIGNIFICANT STUDIES Basic Metabolic Panel:   Recent Labs Lab 07/17/13 0403 07/18/13 0556  NA 145 144  K 3.2* 3.6*  CL 104 104  CO2 23 23  GLUCOSE 122* 132*  BUN 10 22  CREATININE 0.78 1.98*  CALCIUM 9.4 9.3  MG  --  2.0   Liver Function Tests:   Recent Labs Lab 07/15/13 1828 07/16/13 0745  AST 18 17  ALT 19 18  ALKPHOS 89 84  BILITOT 0.4 0.6  PROT 7.3 7.0  ALBUMIN 3.4* 3.3*   CBC:   Recent Labs Lab 07/15/13 1828 07/16/13 0745 07/17/13 0403 07/18/13 0556  WBC 10.7* 10.6* 14.1* 17.9*  NEUTROABS 6.9 8.1*  --   --   HGB 14.6 14.7 16.2 15.6  HCT 41.5 42.0 44.7 43.8  MCV 88.9 88.4 88.0 87.6  PLT 217 200 242 254   Coagulation:   Recent Labs Lab 07/16/13 0745  LABPROT 13.8  INR 1.08   Cardiac Enzymes: No results found for this basename: CKTOTAL, CKMB, CKMBINDEX, TROPONINI,  in the last 168 hours Urinalysis:   Recent Labs Lab 07/15/13 1858  COLORURINE YELLOW  LABSPEC 1.012  PHURINE 7.5  GLUCOSEU NEGATIVE  HGBUR TRACE*  BILIRUBINUR NEGATIVE  KETONESUR NEGATIVE  PROTEINUR NEGATIVE  UROBILINOGEN 0.2  NITRITE NEGATIVE  LEUKOCYTESUR LARGE*  Lipid Panel    Component Value Date/Time   CHOL 150 07/16/2013 0240   TRIG 102 07/16/2013 0240   HDL 43 07/16/2013 0240   CHOLHDL 3.5 07/16/2013 0240   VLDL 20 07/16/2013 0240   LDLCALC 87 07/16/2013 0240   HgbA1C  Lab Results  Component Value Date   HGBA1C 6.0* 07/16/2013    Urine Drug Screen:   No results found for this basename: labopia,  cocainscrnur,  labbenz,  amphetmu,  thcu,  labbarb    Alcohol Level: No results found for this basename: ETH,  in the last 168 hours  Dg Chest 1 View 07/15/2013    Right upper lobe pneumonia.    Dg Chest 2 View 07/16/2013    There is a stable appearance of the right upper lobe pneumonia. No definite infiltrate elsewhere is demonstrated.      MRI / Mra Head Wo Contrast 07/15/2013    Acute nonhemorrhagic infarct posterior right lenticular nucleus  extending into the a posterior right coronal radiata.  Remote large left hemispheric infarct.  Chronically occluded left internal carotid artery.  Intracranial atherosclerotic type changes.   Dg Swallowing Func-speech Pathology 07/16/2013   Recommend continue NPO status with trial of ST for facilitation of oral ROM.   Treatment Recommendation  Therapy as outlined in treatment plan below    Diet Recommendation NPO   Other  Recommendations Oral Care Recommendations: Oral care Q4  per protocol Follow Up Recommendations  Skilled Nursing facility    Frequency and Duration min 1 x/week  2 weeks     CT of the brain    2D Echocardiogram  ejection fraction 60-65%. No cardiac source of emboli was identified.  Carotid Doppler  Findings suggest 1-39% right internal carotid artery stenosis. The left internal carotid artery is occluded. Vertebral arteries are patent with antegrade flow.   EKG  sinus rhythm rate 72 beats per minute. For complete results please see formal report.   Therapy Recommendations - skilled nursing facility recommended.  Physical Exam    Mental Status:  Patient is awake, alert, unable to answer orientation questions do to severe dysarthria  He does follow commands briskly and appears to understand and answers with head shakes/nods.  Cranial Nerves:  II: Visual Fields are limited in the right upper visual field. Pupils are equal, round, and reactive to light. Discs are difficult to visualize.  III,IV, VI: EOMI without ptosis or diploplia.  V: Facial sensation is decreased on right  VII: Facial movement is notable for bilateral weakness.  VIII: hearing is intact to voice  X: Uvula elevates symmetrically  XI: Shoulder shrug is symmetric.  XII: tongue is midline without atrophy or fasciculations.  Motor:  Tone is normal. Bulk is normal. 5/5 strength was present proximally in the left arm, as well as left leg. He has spastic 4/5 weakness of his right arm and leg. He has impaired  fine motor control of his left hand.  Sensory:  Sensation is decreased on the right  Deep Tendon Reflexes:  3+ in the biceps and patellae on the right, 2+ on left  Cerebellar:  Difficulty with finger-nose-finger on the left, unable to perform on the right.  Gait:  Not tested due to patient safety concern   ASSESSMENT [name] is a 79 y.o. male presenting with difficulty speaking and swallowing. TPA was not administered as the patient was outside the window for treatment . An MRI revealed an acute nonhemorrhagic infarct posterior right lenticular nucleus extending into  the a posterior right coronal radiata.  Remote large left hemispheric infarct.  Chronically occluded left internal carotid artery. Infarct felt to be thrombotic. On Plavix 75 mg daily prior to admission. Now on aspirin 325 mg orally every day and clopidogrel 75 mg orally every day for secondary stroke prevention. Patient with resultant severe dysarthria, dysphagia, and impaired fine motor movement of the left hand. Work up underway.   Right lower lobe pneumonia  NPO  Leukocytosis  Previous LMCA CVA  Hospital day # 3  TREATMENT/PLAN  Continue aspirin 325 mg orally every day and clopidogrel 75 mg orally every day for secondary stroke prevention. Change aspirin to 81 mg daily when able to take PO meds. Only getting aspirin suppository - no Plavix at this time  Skilled nursing facility recommended by the therapists  Palliative medicine team met with the family yesterday. The patient has a DO NOT RESUSCITATE status although suctioning and antibiotics are to be continued. A Panda tube was not recommended; however, there has been no decision regarding gastrostomy tube placement as of yet.  Mikey Bussing PA-C [facility] Pager [phone] 07/18/2013, 8:20 AM  Palliative care meeting tomorrow regarding feeding tube. Appreciate Copywriter, advertising.  Failed MBS [name] [name]     I  have personally obtained a history, examined the patient, evaluated imaging results, and formulated the assessment and plan of care. I agree with the above

## 2013-07-18 NOTE — Progress Notes (Signed)
ANTIBIOTIC CONSULT NOTE - FOLLOW UP  Pharmacy Consult for Vancomycin Indication: pneumonia  Allergies  Allergen Reactions  . Oxycontin [Oxycodone Hcl] Other (See Comments)    Severe confusion    Patient Measurements: Height: 6\' 2"  (188 cm) Weight: 169 lb 11.2 oz (76.975 kg) IBW/kg (Calculated) : 82.2 Adjusted Body Weight:   Vital Signs: Temp: 98 F (36.7 C) (02/08 0111) Temp src: Axillary (02/08 0111) BP: 116/78 mmHg (02/08 1045) Pulse Rate: 96 (02/08 1045) Intake/Output from previous day: 02/07 0701 - 02/08 0700 In: -  Out: 175 [Urine:175] Intake/Output from this shift:    Labs:  Recent Labs  07/16/13 0745 07/17/13 0403 07/18/13 0556  WBC 10.6* 14.1* 17.9*  HGB 14.7 16.2 15.6  PLT 200 242 254  CREATININE 0.70 0.78 1.98*   Estimated Creatinine Clearance: 34.6 ml/min (by C-G formula based on Cr of 1.98).  Recent Labs  07/18/13 1130  VANCOTROUGH 26.1*     Microbiology: Recent Results (from the past 720 hour(s))  URINE CULTURE     Status: None   Collection Time    07/15/13  6:58 PM      Result Value Range Status   Specimen Description URINE, CLEAN CATCH   Final   Special Requests NONE   Final   Culture  Setup Time     Final   Value: 07/16/2013 00:58     Performed at [facility]   Colony Count     Final   Value: 30,000 COLONIES/ML     Performed at [facility]   Culture     Final   Value: Multiple bacterial morphotypes present, none predominant. Suggest appropriate recollection if clinically indicated.     Performed at [facility]   Report Status 07/16/2013 FINAL   Final  CULTURE, BLOOD (ROUTINE X 2)     Status: None   Collection Time    07/15/13  7:03 PM      Result Value Range Status   Specimen Description BLOOD LEFT FOREARM   Final   Special Requests BOTTLES DRAWN AEROBIC ONLY 8CC   Final   Culture  Setup Time     Final   Value: 07/16/2013 00:37     Performed at [facility]   Culture     Final   Value:         BLOOD CULTURE RECEIVED NO GROWTH TO DATE CULTURE WILL BE HELD FOR 5 DAYS BEFORE ISSUING A FINAL NEGATIVE REPORT     Performed at [facility]   Report Status PENDING   Incomplete  CULTURE, BLOOD (ROUTINE X 2)     Status: None   Collection Time    07/15/13  7:23 PM      Result Value Range Status   Specimen Description BLOOD HAND LEFT   Final   Special Requests     Final   Value: BOTTLES DRAWN AEROBIC AND ANAEROBIC 10CCBLUE 8CCRED   Culture  Setup Time     Final   Value: 07/16/2013 00:38     Performed at [facility]   Culture     Final   Value:        BLOOD CULTURE RECEIVED NO GROWTH TO DATE CULTURE WILL BE HELD FOR 5 DAYS BEFORE ISSUING A FINAL NEGATIVE REPORT     Performed at [facility]   Report Status PENDING   Incomplete    Anti-infectives   Start     Dose/Rate Route Frequency Ordered Stop  07/16/13 1400  piperacillin-tazobactam (ZOSYN) IVPB 3.375 g  Status:  Discontinued     3.375 g 100 mL/hr over 30 Minutes Intravenous 3 times per day 07/16/13 1139 07/16/13 1142   07/16/13 1400  piperacillin-tazobactam (ZOSYN) IVPB 3.375 g     3.375 g 12.5 mL/hr over 240 Minutes Intravenous 3 times per day 07/16/13 1143     07/16/13 1200  vancomycin (VANCOCIN) IVPB 1000 mg/200 mL premix     1,000 mg 200 mL/hr over 60 Minutes Intravenous Every 12 hours 07/15/13 2331     07/15/13 2100  ceFEPIme (MAXIPIME) 1 g in dextrose 5 % 50 mL IVPB  Status:  Discontinued     1 g 100 mL/hr over 30 Minutes Intravenous Every 12 hours 07/15/13 2040 07/16/13 1139   07/15/13 2045  vancomycin (VANCOCIN) 1,500 mg in sodium chloride 0.9 % 500 mL IVPB     1,500 mg 250 mL/hr over 120 Minutes Intravenous  Once 07/15/13 2040 07/16/13 0053      Assessment: 79 y/o M here with AMS, CXR with PNA, new CVA  Anticoag: Lovenox 40mg /day  Infectious Disease: HCAP, afebrile,  WBC = 10.6>>14.1>>17.9 today.  Vancomycin 2/5> --2/8: Vanco trough 26.1 Zosyn 2/6>> Cefepime 2/5> 2/6  BC x 2  2/5> still pending Urine 2/5>negative  Cardiovascular: HTN, CAD. VSS on ASA300supp  Endocrinology: DM, CBG good 114-160. No coverage  Gastrointestinal / Nutrition: IV PPI, place gastrostomy tube?  Neurology: New CVA, CVA hx 1995, 2006 now on ASA  Nephrology: Scr 0.78>>1.98 noted today! K 3.6  Pulmonary: 94% on 2L   Hematology / Oncology: CBC ok  PTA Medication Issues: Not yet reconciled (swallow eval)  Best Practices:LDL < 100,    Goal of Therapy:  Vancomycin trough level 15-20 mcg/ml  Plan:  Change Vanco to 1g IV q24h.  Evaluate acute rise in Scr daily Continue Zosyn 3.375g IV q8hr dose for now.   [name] [name], PharmD, BCPS Clinical Staff Pharmacist Pager [phone]  [name] [name] 07/18/2013,12:43 PM

## 2013-07-18 NOTE — Evaluation (Signed)
Occupational Therapy Evaluation Patient Details Name: [name] [name] Willow MRN: [id number] DOB: 09/02/1934 Today's Date: 07/18/2013 Time: [id number] OT Time Calculation (min): 24 min  OT Assessment / Plan / Recommendation History of present illness [name] [name] Marks is a 79 y.o. male with a history of a left MCA stroke in 1995 as well as recurrent urinary tract infections who was in his normal state on 07/15/2013 but was noticed around 10 AM that day by his wife to have difficulty speaking. She also noticed that he choked when given some water.     Clinical Impression   Pt was admitted with the above and demonstrates the below listed deficits.  He will benefit from continued OT to maximize safety and independence with BADLs and to reduce the burden of care.   Currently, pt requires total A for all aspects of ADLs and functional mobility    OT Assessment  Patient needs continued OT Services    Follow Up Recommendations  SNF    Barriers to Discharge Decreased caregiver support wife unable to provide current level of assistance  Equipment Recommendations  None recommended by OT    Recommendations for Other Services    Frequency  Min 2X/week    Precautions / Restrictions Precautions Precautions: Fall   Pertinent Vitals/Pain     ADL  Eating/Feeding: NPO Grooming: Wash/dry hands;Wash/dry face;+1 Total assistance Where Assessed - Grooming: Supine, head of bed up;Supported sitting Upper Body Bathing: +1 Total assistance Where Assessed - Upper Body Bathing: Supine, head of bed up Lower Body Bathing: +1 Total assistance Where Assessed - Lower Body Bathing: Supine, head of bed up;Rolling right and/or left Upper Body Dressing: +1 Total assistance Where Assessed - Upper Body Dressing: Supine, head of bed up Lower Body Dressing: +1 Total assistance Where Assessed - Lower Body Dressing: Supine, head of bed up;Rolling right and/or left Toilet Transfer: +1 Total assistance (unable) Toileting -  Clothing Manipulation and Hygiene: +1 Total assistance Where Assessed - Toileting Clothing Manipulation and Hygiene: Supine, head of bed flat;Rolling right and/or left ADL Comments: Pt unable to engage in simple self care tasks.  Sat EOB with max A  x 15 mins    OT Diagnosis: Generalized weakness;Cognitive deficits;Hemiplegia non-dominant side  OT Problem List: Decreased strength;Decreased range of motion;Decreased activity tolerance;Impaired balance (sitting and/or standing);Decreased coordination;Decreased cognition;Decreased knowledge of use of DME or AE;Cardiopulmonary status limiting activity;Impaired UE functional use OT Treatment Interventions: Self-care/ADL training;Neuromuscular education;DME and/or AE instruction;Splinting;Therapeutic activities;Cognitive remediation/compensation;Patient/family education;Balance training   OT Goals(Current goals can be found in the care plan section) Acute Rehab OT Goals Patient Stated Goal: To hopefully get better per wife OT Goal Formulation: With family Time For Goal Achievement: 08/01/13 Potential to Achieve Goals: Fair ADL Goals Pt Will Perform Grooming: with mod assist;sitting Additional ADL Goal #1: Pt will sit EOB x 15 mins with mod A in prep for BADLs Additional ADL Goal #2: Wife will be independent with ROM bil. UEs  Visit Information  Last OT Received On: 07/18/13 Assistance Needed: +2 History of Present Illness: [name] [name] Stanzione is a 79 y.o. male with a history of a left MCA stroke in 1995 as well as recurrent urinary tract infections who was in his normal state on 07/15/2013 but was noticed around 10 AM that day by his wife to have difficulty speaking. She also noticed that he choked when given some water.         Prior Functioning     Home Living Family/patient expects to be discharged to::  Skilled nursing facility Prior Function Level of Independence: Needs assistance Gait / Transfers Assistance Needed: Per wife pt was  working with therapy at [facility] and has walked short distances.   ADL's / Homemaking Assistance Needed: At SNF pt had been requiring total A for ADLs.  Prior to recent hospitalization for UTI and subsequent SNF for rehab stay, pt was modified independent with BADLs including driving Communication Communication: Expressive difficulties;HOH Dominant Hand: Right         Vision/Perception Vision - History Baseline Vision: Wears glasses all the time Vision - Assessment Vision Assessment: Vision not tested Additional Comments: Pt will look to therapist on Rt and Lt. but unable to participate in visual assesment Perception Perception: Not tested Praxis Praxis: Not tested   Cognition  Cognition Arousal/Alertness: Lethargic Behavior During Therapy: Flat affect Overall Cognitive Status: Impaired/Different from baseline Area of Impairment: Following commands Following Commands: Follows one step commands inconsistently;Follows one step commands with increased time General Comments: Pt attempted to follow simple one step motor commands, but deomonstrates difficulty executing them due to generalized weakness    Extremity/Trunk Assessment Upper Extremity Assessment Upper Extremity Assessment: RUE deficits/detail;LUE deficits/detail RUE Deficits / Details: Pt with h/o digit amputation Rt. hand.  He has had remote fractures of this arm.  minimal spontanteous movement noted Rt. UE.  Shoulder flexion to ~80* passively.   RUE Coordination: decreased fine motor;decreased gross motor LUE Deficits / Details: Pt with no active movement noted.  PROM WFL LUE Coordination: decreased fine motor;decreased gross motor Lower Extremity Assessment Lower Extremity Assessment: Defer to PT evaluation Cervical / Trunk Assessment Cervical / Trunk Assessment: Other exceptions Cervical / Trunk Exceptions: Pt with flexed trunk, head/neck.  Pt with poor trunk and head/neck control.  Max assist to hold head up. Unable to  facilitate trunk activation      Mobility Bed Mobility Overal bed mobility: Needs Assistance Bed Mobility: Supine to Sit;Sit to Supine Supine to sit: Total assist;+2 for physical assistance Sit to supine: Total assist;+2 for physical assistance General bed mobility comments: Minimal attempts to assist to lift shoulders.  Wife assisted with moving pt to EOB     Exercise     Balance Balance Overall balance assessment: Needs assistance Sitting-balance support: Feet supported;Bilateral upper extremity supported Sitting balance-Leahy Scale: Zero   End of Session OT - End of Session Activity Tolerance: Patient limited by fatigue Patient left: in bed;with call bell/phone within reach;with bed alarm set;with family/visitor present  GO     Brigida Scotti, Ursula Alert M 07/18/2013, 6:21 PM

## 2013-07-18 NOTE — Progress Notes (Signed)
Patient [name] [name]      DOB: May 07, 1935      JQB:[id number]   Palliative Medicine Team at [facility] Progress Note    Subjective: Met with patient and spouse.  Patient calmer today more focused, less rhonchi.  [name] [name] states they were able to suction copious amounts of secretions earlier.  [name] [name] is able to close his mouth today and is less sonorous with his breathing.  [name] [name] states "I am preparing my self to loose him".  Plan continue to be treat the treatable.  Have PT/OT and speech reeval on Monday.       Filed Vitals:   07/18/13 0111  BP: 121/84  Pulse: 96  Temp: 98 F (36.7 C)  Resp: 20   Physical exam:   General : less distress. Remains aphasic, less respiratory distress today PERRL EOmi, anciteric, mm dry , less upper airway rhonchi Chest : crackles anterior left greater than right chest no wheezing, no snoring CVS: regular, S1, S2 Abdomen: soft, not tender,  Ext: thin with no edema Neuro: expressive aphasia  Assessment and plan: 79 yr old s/p acute lenticular infarction, and right sided upper lobe pneumonia.  Spouse conflicted about course of care.  Patient has survived other strokes but admittedly she states "this one is different".  Agree to continue antibiotics and reassess on Monday .  Will readdress feeding tube issue at that time. [name] [name] needs time to process.  1.  Dnr  2.  Pneumonia NPO continue abx  3. PT/OT/SLP reeval Monday.   Total time:  15 min  815-830 am  Jarrid Lienhard L. Lovena Le, MD MBA The Palliative Medicine Team at [facility] Phone: [phone] Pager: [phone]      3. PT/

## 2013-07-18 NOTE — Progress Notes (Signed)
[facility] PROGRESS NOTE  [name] [name] ZOX:[id number] DOB: 04/12/1935 DOA: 07/15/2013 PCP: Rivka SpringOHEN, PAUL S, MD  Assessment/Plan: Acute nonhemorrhagic infarct  -MRI brain acute right lenticular nucleus infarct  -MRA of brain shows intracranial atherosclerotic vascular disease  -Carotid Dopplers shows occluded left internal carotid--this has been chronic and previously known  -Echocardiogram shows EF 60-65%, grade 1 diastolic dysfunction, no source of emboli  -Discussed with Dr. Myrtie SomanSethi--continue plavix  -While the patient is n.p.o. due to his dysphagia, continue aspirin suppository  Aspiration pneumonia/HCAP  -Swallow evaluation--failed  -MBS--severe oral dysphagia--> remain n.p.o.  -Discontinue cefepime  -Start Zosyn  -Discontinue vancomycin AKI -check FeNa -renal us -??due to vancomycin Hypertension  -When necessary hydralazine when SBP >180  Hyperlipidemia  -Start Lipitor due to drug interaction with simvastatin and amlodipine  -LDL 87  Goals of care  -discussed with Dr. Ladona Ridgelaylor  -DNR  Pyuria  -Urine culture unrevealing, not suggestive of UTI  Hypernatremia  -Start D5 half-normal saline  Hypokalemia  -Repleted -Check magnesium--2.0  Family Communication: wife at beside  Disposition Plan: SNF  Antibiotics:  Vancomycin 07/16/2013>>>07/18/2013 Zosyn 07/16/13>>>          Procedures/Studies: Dg Chest 1 View  07/15/2013   CLINICAL DATA:  Cerebral vascular accident.  Unable to verbalize.  EXAM: CHEST - 1 VIEW  COMPARISON:  DG CHEST 2 VIEW dated 02/15/2013  FINDINGS: Mildly enlarged cardiac silhouette. There is peripheral airspace disease in right upper lobes consistent with pneumonia. No pleural fluid. No pneumothorax. Vascular clips in the right neck.  IMPRESSION: Right upper lobe pneumonia.   Electronically Signed   By: Genevive BiStewart  Edmunds M.D.   On: 07/15/2013 20:17   Dg Chest 2 View  07/16/2013   CLINICAL DATA:  Dyspnea and stroke  EXAM: CHEST  2 VIEW  COMPARISON:   DG CHEST 1 VIEW dated 07/15/2013  FINDINGS: The lungs remain mildly hypoinflated. Confluent interstitial density just above the minor fissure on the frontal film appears to lie posteriorly in the inferior aspect of the upper lobe on the lateral film. The cardiac silhouette is mildly enlarged and stable. The pulmonary vascularity is prominent centrally.  IMPRESSION: There is a stable appearance of the right upper lobe pneumonia. No definite infiltrate elsewhere is demonstrated.   Electronically Signed   By: Bastian Andreoli  [place]   On: 07/16/2013 08:08   Mr Maxine GlennMra Head Wo Contrast  07/15/2013   CLINICAL DATA:  Not able to speak since this morning. Two prior strokes with right-sided deficits. Hypertension.  EXAM: MRI HEAD WITHOUT CONTRAST  MRA HEAD WITHOUT CONTRAST  TECHNIQUE: Multiplanar, multiecho pulse sequences of the brain and surrounding structures were obtained without intravenous contrast. Angiographic images of the head were obtained using MRA technique without contrast.  COMPARISON:  02/15/2013 CT.  12/25/2004 MR.  FINDINGS: MRI HEAD FINDINGS  Acute nonhemorrhagic infarct posterior right lenticular nucleus extending into the a posterior right coronal radiata.  Remote large left hemispheric infarct with encephalomalacia and subsequent dilation of the left lateral ventricle. Small amount of blood breakdown products along the periphery of this remote infarct.  Remote coronal radiata infarcts and basal ganglia infarcts.  Small vessel disease type changes.  Tiny area of blood breakdown products right thalamus most likely related to prior hemorrhage ischemia. Otherwise no evidence of intracranial hemorrhage.  Global atrophy without hydrocephalus.  No intracranial mass lesion noted on this unenhanced exam.  Chronically occluded left internal carotid artery. Please see below.  Transverse ligament hypertrophy. Cervical medullary junction, pituitary region, pineal region and orbital  structures unremarkable.  MRA HEAD FINDINGS   Chronically occluded left internal carotid artery. Minimal reconstitution of flow to portions of the left middle cerebral artery. The branches which have flow are markedly diminutive size and irregular.  Mild irregularity and narrowing of the supraclinoid segment of the right internal carotid artery.  No high-grade stenosis of the right carotid terminus, M1 segment or A1 segment of the right internal carotid artery or right anterior cerebral artery. Partial filling of left anterior cerebral artery A2 segment from the right.  Codominant vertebral arteries with mild narrowing of the distal vertebral artery slightly greater on the left.  Mild narrowing mid aspect of the basilar artery.  Non visualization right posterior inferior cerebellar artery.  Small irregular left anterior inferior cerebellar artery.  Mild irregularity of the left superior cerebellar artery.  No aneurysm noted.  IMPRESSION: Acute nonhemorrhagic infarct posterior right lenticular nucleus extending into the a posterior right coronal radiata.  Remote large left hemispheric infarct.  Chronically occluded left internal carotid artery.  Intracranial atherosclerotic type changes otherwise as detailed above.  Please see above for additional findings.  These results were called by telephone at the time of interpretation on 07/15/2013 at 10:03 PM to Dr. Wilkie Aye , who verbally acknowledged these results.   Electronically Signed   By: Bridgett Larsson M.D.   On: 07/15/2013 22:05   Mr Brain Wo Contrast  07/15/2013   CLINICAL DATA:  Not able to speak since this morning. Two prior strokes with right-sided deficits. Hypertension.  EXAM: MRI HEAD WITHOUT CONTRAST  MRA HEAD WITHOUT CONTRAST  TECHNIQUE: Multiplanar, multiecho pulse sequences of the brain and surrounding structures were obtained without intravenous contrast. Angiographic images of the head were obtained using MRA technique without contrast.  COMPARISON:  02/15/2013 CT.  12/25/2004 MR.  FINDINGS: MRI HEAD  FINDINGS  Acute nonhemorrhagic infarct posterior right lenticular nucleus extending into the a posterior right coronal radiata.  Remote large left hemispheric infarct with encephalomalacia and subsequent dilation of the left lateral ventricle. Small amount of blood breakdown products along the periphery of this remote infarct.  Remote coronal radiata infarcts and basal ganglia infarcts.  Small vessel disease type changes.  Tiny area of blood breakdown products right thalamus most likely related to prior hemorrhage ischemia. Otherwise no evidence of intracranial hemorrhage.  Global atrophy without hydrocephalus.  No intracranial mass lesion noted on this unenhanced exam.  Chronically occluded left internal carotid artery. Please see below.  Transverse ligament hypertrophy. Cervical medullary junction, pituitary region, pineal region and orbital structures unremarkable.  MRA HEAD FINDINGS  Chronically occluded left internal carotid artery. Minimal reconstitution of flow to portions of the left middle cerebral artery. The branches which have flow are markedly diminutive size and irregular.  Mild irregularity and narrowing of the supraclinoid segment of the right internal carotid artery.  No high-grade stenosis of the right carotid terminus, M1 segment or A1 segment of the right internal carotid artery or right anterior cerebral artery. Partial filling of left anterior cerebral artery A2 segment from the right.  Codominant vertebral arteries with mild narrowing of the distal vertebral artery slightly greater on the left.  Mild narrowing mid aspect of the basilar artery.  Non visualization right posterior inferior cerebellar artery.  Small irregular left anterior inferior cerebellar artery.  Mild irregularity of the left superior cerebellar artery.  No aneurysm noted.  IMPRESSION: Acute nonhemorrhagic infarct posterior right lenticular nucleus extending into the a posterior right coronal radiata.  Remote large left  hemispheric infarct.  Chronically occluded left internal carotid artery.  Intracranial atherosclerotic type changes otherwise as detailed above.  Please see above for additional findings.  These results were called by telephone at the time of interpretation on 07/15/2013 at 10:03 PM to Dr. Wilkie Aye , who verbally acknowledged these results.   Electronically Signed   By: Bridgett Larsson M.D.   On: 07/15/2013 22:05   Dg Swallowing Func-speech Pathology  07/16/2013   Breck Coons [place], CCC-SLP     07/16/2013  4:33 PM Objective Swallowing Evaluation: Modified Barium Swallowing Study   Patient Details  Name: [name] [name] MRN: [id number] Date of Birth: 09/19/1934  Today's Date: 07/16/2013 Time: 1345-1500 SLP Time Calculation (min): 75 min  Past Medical History:  Past Medical History  Diagnosis Date  . Coronary artery disease   . Angina   . Hypertension   . Anemia     when had stomach ulcer  . Headache(784.0)   . Arthritis   . Abdominal aneurysm   . Disorder of heart muscle     "thickened heart muscle" per wife  . Carotid artery occlusion   . Bursitis, shoulder     left  . Dizzy spells   . Autonomic orthostatic hypotension   . Peripheral vertigo   . Cluster headaches   . Urinary incontinence   . Stroke 1995, 2006    R side weak, slurred speech, exp. asphasia  . Hx of bladder infections    Past Surgical History:  Past Surgical History  Procedure Laterality Date  . Tonsillectomy    . Hip arthroplasty  10/29/2011    Procedure: ARTHROPLASTY BIPOLAR HIP;  Surgeon: Shelda Pal,  MD;  Location: [facility];  Service: Orthopedics;  Laterality: Right;   . Fracture surgery      both hands  . Fracture surgery      Right arm X's 2, back  . Joint replacement  11/03/11    Right hip, Fx from a fall  . Carotid endarterectomy Right   . Hernia repair Left   . Liver biopsy      x2,    HPI:  79 y.o. male with Past medical history of coronary artery  disease, hypertension, recurrent CVA 24 & 2006 with expressive  aphasia, recurrent fall,  dyslipidemia, urinary incontinence.  recurrent UTI SNF.  The patient presents with episode of speech  difficulty and drop off in the of his mouth that was noted around  10:00.the history was obtained from patient's wife as the patient  has aphasia.on 8:30 in the morning the wife saw the patient has  fine and at his baseline.when she returned around 10:00 she found  that the patient was sitting in his chair, with his head down and  drooling from his mouth. He also started having some cough and a  chest x-ray was performed there which showed pneumonia.  As per  the wife at his baseline the patient is not ambulating and  requires significant assistance for his ADL occasionally can  speak some words.  MRI Acute nonhemorrhagic infarct posterior  right lenticular nucleus extending into the a posterior right  coronal radiata. Remote large left hemispheric infarct.  CXR  There is a stable appearance of the right upper lobe pneumonia.  No definite infiltrate elsewhere is demonstrated.  Wife reported  pt. observed to have recent dysphagia with coughing after  liquids, expectorating pill from pharynx after several hours.     Assessment / Plan / Recommendation Clinical Impression  Dysphagia Diagnosis: Severe oral  phase dysphagia Clinical impression: Pt. exhibited severe oral dysphagia  evidenced by glossopharygneal IX and vagus X involvement/apraxia  resulting in inability to efficiently transit bolus from anterior  to posterior oral cavity with assist (dry spoon).  Pt. alert and  attempting to propel puree by tilting head. One weak swallow was  initated with small amount of applesauce/barium propelled with  mild vallecular residue.  Barium was repeatedly suctioned from  oral cavity.  Recommend continue NPO status with trial of ST for  facilitation of oral ROM.      Treatment Recommendation  Therapy as outlined in treatment plan below    Diet Recommendation NPO        Other  Recommendations Oral Care Recommendations: Oral care  Q4  per protocol   Follow Up Recommendations  Skilled Nursing facility    Frequency and Duration min 1 x/week  2 weeks   Pertinent Vitals/Pain WDL          Reason for Referral Objectively evaluate swallowing function   Oral Phase Oral Preparation/Oral Phase Oral Phase: Impaired Oral - Honey Oral - Honey Teaspoon: Weak lingual manipulation;Reduced  posterior propulsion;Holding of bolus;Right anterior bolus  loss;Incomplete tongue to palate contact;Right pocketing in  lateral sulci;Pocketing in anterior sulcus;Lingual/palatal  residue;Decreased velopharyngeal closure Oral - Solids Oral - Puree: Weak lingual manipulation;Reduced posterior  propulsion;Holding of bolus;Right anterior bolus loss;Incomplete  tongue to palate contact;Right pocketing in lateral  sulci;Pocketing in anterior sulcus;Lingual/palatal  residue;Decreased velopharyngeal closure   Pharyngeal Phase Pharyngeal Phase Pharyngeal Phase: Impaired Pharyngeal - Honey Pharyngeal - Honey Cup: Pharyngeal residue -  valleculae;Pharyngeal residue - pyriform sinuses (tongue base  residue)  Cervical Esophageal Phase    GO              Breck Coons [place] M.Ed CCC-SLP Pager [phone]  07/16/2013         Subjective: Patient awake and alert. No reports of vomiting, respiratory distress, diarrhea, respiratory distress.  Objective: Filed Vitals:   07/17/13 2128 07/18/13 0111 07/18/13 1045 07/18/13 1416  BP: 170/89 121/84 116/78 153/95  Pulse: 102 96 96 94  Temp: 97.5 F (36.4 C) 98 F (36.7 C)  97.8 F (36.6 C)  TempSrc: Axillary Axillary  Axillary  Resp: 20 20 18 16   Height:      Weight:      SpO2: 95% 94% 94% 96%    Intake/Output Summary (Last 24 hours) at 07/18/13 1422 Last data filed at 07/17/13 1828  Gross per 24 hour  Intake      0 ml  Output    175 ml  Net   -175 ml   Weight change:  Exam:   General:  Pt is alert, does not follow commands appropriately, not in acute distress  HEENT: No icterus, No thrush,   [place]/AT  Cardiovascular: RRR, S1/S2, no rubs, no gallops  Respiratory: bibasilar crackles. Scattered upper airway rhonchi. No wheezing.  Abdomen: Soft/+BS, non tender, non distended, no guarding  Extremities: trace LE edema, No lymphangitis, No petechiae, No rashes, no synovitis  Data Reviewed: Basic Metabolic Panel:  Recent Labs Lab 07/15/13 1828 07/16/13 0745 07/17/13 0403 07/18/13 0556  NA 143 144 145 144  K 3.5* 3.5* 3.2* 3.6*  CL 103 106 104 104  CO2 27 24 23 23   GLUCOSE 102* 113* 122* 132*  BUN 9 7 10 22   CREATININE 0.85 0.70 0.78 1.98*  CALCIUM 9.2 8.6 9.4 9.3  MG  --   --   --  2.0   Liver  Function Tests:  Recent Labs Lab 07/15/13 1828 07/16/13 0745  AST 18 17  ALT 19 18  ALKPHOS 89 84  BILITOT 0.4 0.6  PROT 7.3 7.0  ALBUMIN 3.4* 3.3*   No results found for this basename: LIPASE, AMYLASE,  in the last 168 hours  Recent Labs Lab 07/16/13 0010  AMMONIA 28   CBC:  Recent Labs Lab 07/15/13 1828 07/16/13 0745 07/17/13 0403 07/18/13 0556  WBC 10.7* 10.6* 14.1* 17.9*  NEUTROABS 6.9 8.1*  --   --   HGB 14.6 14.7 16.2 15.6  HCT 41.5 42.0 44.7 43.8  MCV 88.9 88.4 88.0 87.6  PLT 217 200 242 254   Cardiac Enzymes: No results found for this basename: CKTOTAL, CKMB, CKMBINDEX, TROPONINI,  in the last 168 hours BNP: No components found with this basename: POCBNP,  CBG:  Recent Labs Lab 07/17/13 0527 07/17/13 1143 07/17/13 1741 07/18/13 0018 07/18/13 0549  GLUCAP 130* 114* 134* 160* 133*    Recent Results (from the past 240 hour(s))  URINE CULTURE     Status: None   Collection Time    07/15/13  6:58 PM      Result Value Range Status   Specimen Description URINE, CLEAN CATCH   Final   Special Requests NONE   Final   Culture  Setup Time     Final   Value: 07/16/2013 00:58     Performed at [facility] Count     Final   Value: 30,000 COLONIES/ML     Performed at [facility]   Culture     Final   Value: Multiple  bacterial morphotypes present, none predominant. Suggest appropriate recollection if clinically indicated.     Performed at [facility]   Report Status 07/16/2013 FINAL   Final  CULTURE, BLOOD (ROUTINE X 2)     Status: None   Collection Time    07/15/13  7:03 PM      Result Value Range Status   Specimen Description BLOOD LEFT FOREARM   Final   Special Requests BOTTLES DRAWN AEROBIC ONLY 8CC   Final   Culture  Setup Time     Final   Value: 07/16/2013 00:37     Performed at [facility]   Culture     Final   Value:        BLOOD CULTURE RECEIVED NO GROWTH TO DATE CULTURE WILL BE HELD FOR 5 DAYS BEFORE ISSUING A FINAL NEGATIVE REPORT     Performed at [facility]   Report Status PENDING   Incomplete  CULTURE, BLOOD (ROUTINE X 2)     Status: None   Collection Time    07/15/13  7:23 PM      Result Value Range Status   Specimen Description BLOOD HAND LEFT   Final   Special Requests     Final   Value: BOTTLES DRAWN AEROBIC AND ANAEROBIC 10CCBLUE 8CCRED   Culture  Setup Time     Final   Value: 07/16/2013 00:38     Performed at [facility]   Culture     Final   Value:        BLOOD CULTURE RECEIVED NO GROWTH TO DATE CULTURE WILL BE HELD FOR 5 DAYS BEFORE ISSUING A FINAL NEGATIVE REPORT     Performed at [facility]   Report Status PENDING   Incomplete     Scheduled Meds: . aspirin  300 mg Rectal Daily  .  enoxaparin (LOVENOX) injection  40 mg Subcutaneous Daily  . pantoprazole (PROTONIX) IV  40 mg Intravenous Q24H  . piperacillin-tazobactam (ZOSYN)  IV  3.375 g Intravenous Q8H   Continuous Infusions: . dextrose 5 % and 0.45% NaCl 1,000 mL with potassium chloride 20 mEq infusion 75 mL/hr at 07/17/13 2029     Deklin Bieler, DO  [facility] Pager [phone]  If 7PM-7AM, please contact night-coverage [email or web address] Password [email or web address] 07/18/2013, 2:22 PM   LOS: 3 days

## 2013-07-18 NOTE — Clinical Social Work Psychosocial (Signed)
Clinical Social Work Department BRIEF PSYCHOSOCIAL ASSESSMENT 07/18/2013  Patient:  [name] [name], [name] [name]     Account Number:  [id number]     Admit date:  07/15/2013  Clinical Social Worker:  Lovey Newcomer  Date/Time:  07/18/2013 03:56 PM  Referred by:  Physician  Date Referred:  07/18/2013 Referred for  SNF Placement   Other Referral:   Interview type:  Family Other interview type:   Patient's wife interviewed at bedside as patient cannot contribute to assessment.    PSYCHOSOCIAL DATA Living Status:  WIFE Admitted from facility:  [place] Level of care:  [place] Primary support name:  [name] [name] Primary support relationship to patient:  SPOUSE Degree of support available:   Support is good.    CURRENT CONCERNS Current Concerns  Post-Acute Placement   Other Concerns:    SOCIAL WORK ASSESSMENT / PLAN CSW met with wife at bedside to discuss recommendation for SNF. Wife states that patient was at [facility] prior to this hospitalization for short term rehab for about 2-3 weeks. She states that if patient needs SNF that she would like for him to return to [place]. Wife seemed very upset that CSW was approaching her about discposition planning. CSW explained that this conversation does not mean that patient is discharging today or even tomorrow. CSW explained that only MD makes that decision. CSW explained the purpose of disposition planning. Wife asked to speak with CSW outside of room. Wife believes that patient will not make it out of the hospital. CSW asked if wife has had contact with PMT and she states that she has. CSW explained that if patient improves then we will plan for his return to [facility] if needed.   Assessment/plan status:  Psychosocial Support/Ongoing Assessment of Needs Other assessment/ plan:   Complete FL2, Fax   Information/referral to community resources:    PATIENT'S/FAMILY'S RESPONSE TO PLAN OF CARE: Patient's wife was  somewhat upset by the conversation around disposition plans for patient. She is clearly under the impression that the patient will not make it out of the hospital. CSW provided emotional support to wife. Wife plans for patient to return to [facility] if appropriate. She plans to continue having contact with PMT. CSW will continue to follow for Dc needs.       Liz Beach, [place], [place], [phone]

## 2013-07-19 ENCOUNTER — Inpatient Hospital Stay (HOSPITAL_COMMUNITY): Payer: Medicare Other

## 2013-07-19 LAB — CBC
HEMATOCRIT: 43.9 % (ref 39.0–52.0)
HEMOGLOBIN: 15.3 g/dL (ref 13.0–17.0)
MCH: 30.9 pg (ref 26.0–34.0)
MCHC: 34.9 g/dL (ref 30.0–36.0)
MCV: 88.7 fL (ref 78.0–100.0)
Platelets: 252 10*3/uL (ref 150–400)
RBC: 4.95 MIL/uL (ref 4.22–5.81)
RDW: 13.3 % (ref 11.5–15.5)
WBC: 15.3 10*3/uL — AB (ref 4.0–10.5)

## 2013-07-19 LAB — GLUCOSE, CAPILLARY
GLUCOSE-CAPILLARY: 129 mg/dL — AB (ref 70–99)
Glucose-Capillary: 111 mg/dL — ABNORMAL HIGH (ref 70–99)
Glucose-Capillary: 118 mg/dL — ABNORMAL HIGH (ref 70–99)
Glucose-Capillary: 132 mg/dL — ABNORMAL HIGH (ref 70–99)
Glucose-Capillary: 137 mg/dL — ABNORMAL HIGH (ref 70–99)
Glucose-Capillary: 141 mg/dL — ABNORMAL HIGH (ref 70–99)

## 2013-07-19 LAB — BASIC METABOLIC PANEL
BUN: 28 mg/dL — ABNORMAL HIGH (ref 6–23)
CHLORIDE: 105 meq/L (ref 96–112)
CO2: 21 mEq/L (ref 19–32)
Calcium: 9.2 mg/dL (ref 8.4–10.5)
Creatinine, Ser: 2.5 mg/dL — ABNORMAL HIGH (ref 0.50–1.35)
GFR calc Af Amer: 27 mL/min — ABNORMAL LOW (ref >90)
GFR calc non Af Amer: 23 mL/min — ABNORMAL LOW (ref >90)
Glucose, Bld: 115 mg/dL — ABNORMAL HIGH (ref 70–99)
Potassium: 3.4 mEq/L — ABNORMAL LOW (ref 3.7–5.3)
SODIUM: 144 meq/L (ref 137–147)

## 2013-07-19 LAB — SODIUM, URINE, RANDOM: Sodium, Ur: 43 mEq/L

## 2013-07-19 LAB — CREATININE, URINE, RANDOM: Creatinine, Urine: 116.31 mg/dL

## 2013-07-19 MED ORDER — ENOXAPARIN SODIUM 30 MG/0.3ML ~~LOC~~ SOLN
30.0000 mg | Freq: Every day | SUBCUTANEOUS | Status: DC
  Filled 2013-07-19: qty 0.3

## 2013-07-19 NOTE — Progress Notes (Signed)
[facility] PROGRESS NOTE  [name] [name] ZOX:[id number] DOB: 09-27-1934 DOA: 07/15/2013 PCP: Rivka Spring, MD  Assessment/Plan: Acute nonhemorrhagic infarct  -MRI brain acute right lenticular nucleus infarct  -MRA of brain shows intracranial atherosclerotic vascular disease  -Carotid Dopplers shows occluded left internal carotid--this has been chronic and previously known  -Echocardiogram shows EF 60-65%, grade 1 diastolic dysfunction, no source of emboli  -Discussed with Dr. Myrtie Soman plavix  -While the patient is n.p.o. due to his dysphagia, continue aspirin suppository  Aspiration pneumonia/HCAP  -Swallow evaluation--failed  -MBS--severe oral dysphagia--> remain n.p.o.  -Repeat speech therapy evaluation continues to recommend npo -Discontinue cefepime  -continue Zosyn  -Discontinue vancomycin  AKI  -check FeNa--0.64% -continue judicious fluids  -renal US  -??due to vancomycin  Cheyne-Stokes respirations -Secondary to the patient's strokes -His respirations appear more sonorous intermittenly due to his pattern of respirations Hypertension  -When necessary hydralazine when SBP >180  Hyperlipidemia  -Start Lipitor due to drug interaction with simvastatin and amlodipine  -LDL 87  Goals of care  -discussed with Dr. Ladona Ridgel  -DNR  -await decision on gastrostomy tube Pyuria  -Urine culture unrevealing, not suggestive of UTI  Hypernatremia  -improving on  D5 half-normal saline  Hypokalemia  -Repleted  -Check magnesium--2.0  Family Communication: wife at beside  Disposition Plan: SNF  Antibiotics:  Vancomycin 07/16/2013>>>07/18/2013  Zosyn 07/16/13>>>           Procedures/Studies: Dg Chest 1 View  07/15/2013   CLINICAL DATA:  Cerebral vascular accident.  Unable to verbalize.  EXAM: CHEST - 1 VIEW  COMPARISON:  DG CHEST 2 VIEW dated 02/15/2013  FINDINGS: Mildly enlarged cardiac silhouette. There is peripheral airspace disease in right upper lobes  consistent with pneumonia. No pleural fluid. No pneumothorax. Vascular clips in the right neck.  IMPRESSION: Right upper lobe pneumonia.   Electronically Signed   By: Genevive Bi M.D.   On: 07/15/2013 20:17   Dg Chest 2 View  07/16/2013   CLINICAL DATA:  Dyspnea and stroke  EXAM: CHEST  2 VIEW  COMPARISON:  DG CHEST 1 VIEW dated 07/15/2013  FINDINGS: The lungs remain mildly hypoinflated. Confluent interstitial density just above the minor fissure on the frontal film appears to lie posteriorly in the inferior aspect of the upper lobe on the lateral film. The cardiac silhouette is mildly enlarged and stable. The pulmonary vascularity is prominent centrally.  IMPRESSION: There is a stable appearance of the right upper lobe pneumonia. No definite infiltrate elsewhere is demonstrated.   Electronically Signed   By: Janeth Terry  [place]   On: 07/16/2013 08:08   Mr Maxine Glenn Head Wo Contrast  07/15/2013   CLINICAL DATA:  Not able to speak since this morning. Two prior strokes with right-sided deficits. Hypertension.  EXAM: MRI HEAD WITHOUT CONTRAST  MRA HEAD WITHOUT CONTRAST  TECHNIQUE: Multiplanar, multiecho pulse sequences of the brain and surrounding structures were obtained without intravenous contrast. Angiographic images of the head were obtained using MRA technique without contrast.  COMPARISON:  02/15/2013 CT.  12/25/2004 MR.  FINDINGS: MRI HEAD FINDINGS  Acute nonhemorrhagic infarct posterior right lenticular nucleus extending into the a posterior right coronal radiata.  Remote large left hemispheric infarct with encephalomalacia and subsequent dilation of the left lateral ventricle. Small amount of blood breakdown products along the periphery of this remote infarct.  Remote coronal radiata infarcts and basal ganglia infarcts.  Small vessel disease type changes.  Tiny area of blood breakdown products right thalamus most likely related to prior hemorrhage  ischemia. Otherwise no evidence of intracranial hemorrhage.  Global  atrophy without hydrocephalus.  No intracranial mass lesion noted on this unenhanced exam.  Chronically occluded left internal carotid artery. Please see below.  Transverse ligament hypertrophy. Cervical medullary junction, pituitary region, pineal region and orbital structures unremarkable.  MRA HEAD FINDINGS  Chronically occluded left internal carotid artery. Minimal reconstitution of flow to portions of the left middle cerebral artery. The branches which have flow are markedly diminutive size and irregular.  Mild irregularity and narrowing of the supraclinoid segment of the right internal carotid artery.  No high-grade stenosis of the right carotid terminus, M1 segment or A1 segment of the right internal carotid artery or right anterior cerebral artery. Partial filling of left anterior cerebral artery A2 segment from the right.  Codominant vertebral arteries with mild narrowing of the distal vertebral artery slightly greater on the left.  Mild narrowing mid aspect of the basilar artery.  Non visualization right posterior inferior cerebellar artery.  Small irregular left anterior inferior cerebellar artery.  Mild irregularity of the left superior cerebellar artery.  No aneurysm noted.  IMPRESSION: Acute nonhemorrhagic infarct posterior right lenticular nucleus extending into the a posterior right coronal radiata.  Remote large left hemispheric infarct.  Chronically occluded left internal carotid artery.  Intracranial atherosclerotic type changes otherwise as detailed above.  Please see above for additional findings.  These results were called by telephone at the time of interpretation on 07/15/2013 at 10:03 PM to Dr. Wilkie Aye , who verbally acknowledged these results.   Electronically Signed   By: Bridgett Larsson M.D.   On: 07/15/2013 22:05   Mr Brain Wo Contrast  07/15/2013   CLINICAL DATA:  Not able to speak since this morning. Two prior strokes with right-sided deficits. Hypertension.  EXAM: MRI HEAD WITHOUT  CONTRAST  MRA HEAD WITHOUT CONTRAST  TECHNIQUE: Multiplanar, multiecho pulse sequences of the brain and surrounding structures were obtained without intravenous contrast. Angiographic images of the head were obtained using MRA technique without contrast.  COMPARISON:  02/15/2013 CT.  12/25/2004 MR.  FINDINGS: MRI HEAD FINDINGS  Acute nonhemorrhagic infarct posterior right lenticular nucleus extending into the a posterior right coronal radiata.  Remote large left hemispheric infarct with encephalomalacia and subsequent dilation of the left lateral ventricle. Small amount of blood breakdown products along the periphery of this remote infarct.  Remote coronal radiata infarcts and basal ganglia infarcts.  Small vessel disease type changes.  Tiny area of blood breakdown products right thalamus most likely related to prior hemorrhage ischemia. Otherwise no evidence of intracranial hemorrhage.  Global atrophy without hydrocephalus.  No intracranial mass lesion noted on this unenhanced exam.  Chronically occluded left internal carotid artery. Please see below.  Transverse ligament hypertrophy. Cervical medullary junction, pituitary region, pineal region and orbital structures unremarkable.  MRA HEAD FINDINGS  Chronically occluded left internal carotid artery. Minimal reconstitution of flow to portions of the left middle cerebral artery. The branches which have flow are markedly diminutive size and irregular.  Mild irregularity and narrowing of the supraclinoid segment of the right internal carotid artery.  No high-grade stenosis of the right carotid terminus, M1 segment or A1 segment of the right internal carotid artery or right anterior cerebral artery. Partial filling of left anterior cerebral artery A2 segment from the right.  Codominant vertebral arteries with mild narrowing of the distal vertebral artery slightly greater on the left.  Mild narrowing mid aspect of the basilar artery.  Non visualization right posterior  inferior cerebellar  artery.  Small irregular left anterior inferior cerebellar artery.  Mild irregularity of the left superior cerebellar artery.  No aneurysm noted.  IMPRESSION: Acute nonhemorrhagic infarct posterior right lenticular nucleus extending into the a posterior right coronal radiata.  Remote large left hemispheric infarct.  Chronically occluded left internal carotid artery.  Intracranial atherosclerotic type changes otherwise as detailed above.  Please see above for additional findings.  These results were called by telephone at the time of interpretation on 07/15/2013 at 10:03 PM to Dr. Wilkie Aye , who verbally acknowledged these results.   Electronically Signed   By: Bridgett Larsson M.D.   On: 07/15/2013 22:05   Dg Chest Port 1 View  07/18/2013   CLINICAL DATA:  Aspiration pneumonia.  Elevated white count.  EXAM: PORTABLE CHEST - 1 VIEW  COMPARISON:  07/16/2013  FINDINGS: Low lung volumes. Right base atelectasis. Heart is borderline in size. No effusions. No edema. No acute bony abnormality.  IMPRESSION: Low lung volumes.  Right base atelectasis.   Electronically Signed   By: Charlett Nose M.D.   On: 07/18/2013 17:16   Dg Swallowing Func-speech Pathology  07/16/2013   Breck Coons [place], CCC-SLP     07/16/2013  4:33 PM Objective Swallowing Evaluation: Modified Barium Swallowing Study   Patient Details  Name: [name] [name] MRN: [id number] Date of Birth: 1934-11-08  Today's Date: 07/16/2013 Time: 1345-1500 SLP Time Calculation (min): 75 min  Past Medical History:  Past Medical History  Diagnosis Date  . Coronary artery disease   . Angina   . Hypertension   . Anemia     when had stomach ulcer  . Headache(784.0)   . Arthritis   . Abdominal aneurysm   . Disorder of heart muscle     "thickened heart muscle" per wife  . Carotid artery occlusion   . Bursitis, shoulder     left  . Dizzy spells   . Autonomic orthostatic hypotension   . Peripheral vertigo   . Cluster headaches   . Urinary incontinence   . Stroke 1995,  2006    R side weak, slurred speech, exp. asphasia  . Hx of bladder infections    Past Surgical History:  Past Surgical History  Procedure Laterality Date  . Tonsillectomy    . Hip arthroplasty  10/29/2011    Procedure: ARTHROPLASTY BIPOLAR HIP;  Surgeon: Shelda Pal,  MD;  Location: [facility];  Service: Orthopedics;  Laterality: Right;   . Fracture surgery      both hands  . Fracture surgery      Right arm X's 2, back  . Joint replacement  11/03/11    Right hip, Fx from a fall  . Carotid endarterectomy Right   . Hernia repair Left   . Liver biopsy      x2,    HPI:  79 y.o. 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  recurrent UTI SNF.  The patient presents with episode of speech  difficulty and drop off in the of his mouth that was noted around  10:00.the history was obtained from patient's wife as the patient  has aphasia.on 8:30 in the morning the wife saw the patient has  fine and at his baseline.when she returned around 10:00 she found  that the patient was sitting in his chair, with his head down and  drooling from his mouth. He also started having some  cough and a  chest x-ray was performed there which showed pneumonia.  As per  the wife at his baseline the patient is not ambulating and  requires significant assistance for his ADL occasionally can  speak some words.  MRI Acute nonhemorrhagic infarct posterior  right lenticular nucleus extending into the a posterior right  coronal radiata. Remote large left hemispheric infarct.  CXR  There is a stable appearance of the right upper lobe pneumonia.  No definite infiltrate elsewhere is demonstrated.  Wife reported  pt. observed to have recent dysphagia with coughing after  liquids, expectorating pill from pharynx after several hours.     Assessment / Plan / Recommendation Clinical Impression  Dysphagia Diagnosis: Severe oral phase  dysphagia Clinical impression: Pt. exhibited severe oral dysphagia  evidenced by glossopharygneal IX and vagus X involvement/apraxia  resulting in inability to efficiently transit bolus from anterior  to posterior oral cavity with assist (dry spoon).  Pt. alert and  attempting to propel puree by tilting head. One weak swallow was  initated with small amount of applesauce/barium propelled with  mild vallecular residue.  Barium was repeatedly suctioned from  oral cavity.  Recommend continue NPO status with trial of ST for  facilitation of oral ROM.      Treatment Recommendation  Therapy as outlined in treatment plan below    Diet Recommendation NPO        Other  Recommendations Oral Care Recommendations: Oral care Q4  per protocol   Follow Up Recommendations  Skilled Nursing facility    Frequency and Duration min 1 x/week  2 weeks   Pertinent Vitals/Pain WDL          Reason for Referral Objectively evaluate swallowing function   Oral Phase Oral Preparation/Oral Phase Oral Phase: Impaired Oral - Honey Oral - Honey Teaspoon: Weak lingual manipulation;Reduced  posterior propulsion;Holding of bolus;Right anterior bolus  loss;Incomplete tongue to palate contact;Right pocketing in  lateral sulci;Pocketing in anterior sulcus;Lingual/palatal  residue;Decreased velopharyngeal closure Oral - Solids Oral - Puree: Weak lingual manipulation;Reduced posterior  propulsion;Holding of bolus;Right anterior bolus loss;Incomplete  tongue to palate contact;Right pocketing in lateral  sulci;Pocketing in anterior sulcus;Lingual/palatal  residue;Decreased velopharyngeal closure   Pharyngeal Phase Pharyngeal Phase Pharyngeal Phase: Impaired Pharyngeal - Honey Pharyngeal - Honey Cup: Pharyngeal residue -  valleculae;Pharyngeal residue - pyriform sinuses (tongue base  residue)  Cervical Esophageal Phase    GO              Breck CoonsLisa Willis Litaker M.Ed CCC-SLP Pager [phone]  07/16/2013         Subjective: Patient is having Cheyne-Stokes  respirations. Denies any shortness breath. No reports of vomiting, respiratory distress, diarrhea.  Objective: Filed Vitals:   07/19/13 0628 07/19/13 1047 07/19/13 1412 07/19/13 1809  BP: 167/93 157/92 156/96 172/93  Pulse: 86 80 87 88  Temp: 98.4 F (36.9 C) 98.9 F (37.2 C) 97.4 F (36.3 C) 97.5 F (36.4 C)  TempSrc: Oral Axillary Axillary Axillary  Resp: 18 20 20 20   Height:      Weight:      SpO2: 98% 94% 99% 97%   No intake or output data in the 24 hours ending 07/19/13 1824 Weight change:  Exam:   General:  Pt is alert, does not follow commands appropriately, not in acute distress  HEENT: No icterus, No thrush, [place]/AT  Cardiovascular: RRR, S1/S2, no rubs, no gallops  Respiratory: Upper airway rhonchi. Scattered basilar crackles. No wheezing. Good air movement.  Abdomen: Soft/+BS, non  tender, non distended, no guarding  Extremities: No edema, No lymphangitis, No petechiae, No rashes, no synovitis  Data Reviewed: Basic Metabolic Panel:  Recent Labs Lab 07/15/13 1828 07/16/13 0745 07/17/13 0403 07/18/13 0556 07/19/13 0830  NA 143 144 145 144 144  K 3.5* 3.5* 3.2* 3.6* 3.4*  CL 103 106 104 104 105  CO2 27 24 23 23 21   GLUCOSE 102* 113* 122* 132* 115*  BUN 9 7 10 22  28*  CREATININE 0.85 0.70 0.78 1.98* 2.50*  CALCIUM 9.2 8.6 9.4 9.3 9.2  MG  --   --   --  2.0  --    Liver Function Tests:  Recent Labs Lab 07/15/13 1828 07/16/13 0745  AST 18 17  ALT 19 18  ALKPHOS 89 84  BILITOT 0.4 0.6  PROT 7.3 7.0  ALBUMIN 3.4* 3.3*   No results found for this basename: LIPASE, AMYLASE,  in the last 168 hours  Recent Labs Lab 07/16/13 0010  AMMONIA 28   CBC:  Recent Labs Lab 07/15/13 1828 07/16/13 0745 07/17/13 0403 07/18/13 0556 07/19/13 0830  WBC 10.7* 10.6* 14.1* 17.9* 15.3*  NEUTROABS 6.9 8.1*  --   --   --   HGB 14.6 14.7 16.2 15.6 15.3  HCT 41.5 42.0 44.7 43.8 43.9  MCV 88.9 88.4 88.0 87.6 88.7  PLT 217 200 242 254 252   Cardiac  Enzymes: No results found for this basename: CKTOTAL, CKMB, CKMBINDEX, TROPONINI,  in the last 168 hours BNP: No components found with this basename: POCBNP,  CBG:  Recent Labs Lab 07/19/13 0005 07/19/13 0615 07/19/13 1121 07/19/13 1200 07/19/13 1806  GLUCAP 118* 137* 141* 132* 111*    Recent Results (from the past 240 hour(s))  URINE CULTURE     Status: None   Collection Time    07/15/13  6:58 PM      Result Value Range Status   Specimen Description URINE, CLEAN CATCH   Final   Special Requests NONE   Final   Culture  Setup Time     Final   Value: 07/16/2013 00:58     Performed at [facility] Count     Final   Value: 30,000 COLONIES/ML     Performed at [facility]   Culture     Final   Value: Multiple bacterial morphotypes present, none predominant. Suggest appropriate recollection if clinically indicated.     Performed at [facility]   Report Status 07/16/2013 FINAL   Final  CULTURE, BLOOD (ROUTINE X 2)     Status: None   Collection Time    07/15/13  7:03 PM      Result Value Range Status   Specimen Description BLOOD LEFT FOREARM   Final   Special Requests BOTTLES DRAWN AEROBIC ONLY 8CC   Final   Culture  Setup Time     Final   Value: 07/16/2013 00:37     Performed at [facility]   Culture     Final   Value:        BLOOD CULTURE RECEIVED NO GROWTH TO DATE CULTURE WILL BE HELD FOR 5 DAYS BEFORE ISSUING A FINAL NEGATIVE REPORT     Performed at [facility]   Report Status PENDING   Incomplete  CULTURE, BLOOD (ROUTINE X 2)     Status: None   Collection Time    07/15/13  7:23 PM      Result Value Range Status  Specimen Description BLOOD HAND LEFT   Final   Special Requests     Final   Value: BOTTLES DRAWN AEROBIC AND ANAEROBIC 10CCBLUE 8CCRED   Culture  Setup Time     Final   Value: 07/16/2013 00:38     Performed at [facility]   Culture     Final   Value:        BLOOD CULTURE RECEIVED NO GROWTH TO  DATE CULTURE WILL BE HELD FOR 5 DAYS BEFORE ISSUING A FINAL NEGATIVE REPORT     Performed at [facility]   Report Status PENDING   Incomplete     Scheduled Meds: . aspirin  300 mg Rectal Daily  . [START ON 07/20/2013] enoxaparin (LOVENOX) injection  30 mg Subcutaneous Daily  . pantoprazole (PROTONIX) IV  40 mg Intravenous Q24H  . piperacillin-tazobactam (ZOSYN)  IV  3.375 g Intravenous Q8H   Continuous Infusions: . dextrose 5 % and 0.45% NaCl 1,000 mL with potassium chloride 20 mEq infusion 75 mL/hr at 07/19/13 0110     Kellyn Mccary, DO  [facility] Pager [phone]  If 7PM-7AM, please contact night-coverage [email or web address] Password [email or web address] 07/19/2013, 6:24 PM   LOS: 4 days

## 2013-07-19 NOTE — Progress Notes (Signed)
Stroke Team Progress Note  HISTORY [name] [name] is a 79 y.o. male with a history of a left MCA stroke in 1995 as well as recurrent urinary tract infections who was in his normal state on 07/15/2013 but was noticed around 10 AM that day by his wife to have difficulty speaking. She also noticed that he choked when given some water. She asked to see the doctor at his nursing home, but when this was taking too long, she asked to be transferred to [facility] and was therefore transported here. Once here, he received an MRI which confirmed a right lenticular infarct. Of note, following his stroke in 1995, he had had good improvement. He had never returned to speaking normally, but had been able to drive with some assistive devices. TPA was not administered due to daily and arrival. He was admitted for further stroke evaluation and treatment.  SUBJECTIVE The patient's wife is at his bedside. She has met with palliative care. She is leaning toward tube placement.  OBJECTIVE Most recent Vital Signs: Filed Vitals:   07/18/13 1819 07/18/13 2208 07/19/13 0135 07/19/13 0628  BP: 161/82 149/89 145/90 167/93  Pulse: 89 84 82 86  Temp: 97.6 F (36.4 C) 98.2 F (36.8 C) 98.2 F (36.8 C) 98.4 F (36.9 C)  TempSrc: Axillary Axillary Oral Oral  Resp: 30 18 18 18   Height:      Weight:      SpO2: 98% 98% 96% 98%   CBG (last 3)   Recent Labs  07/18/13 1816 07/19/13 0005 07/19/13 0615  GLUCAP 129* 118* 137*    IV Fluid Intake:   . dextrose 5 % and 0.45% NaCl 1,000 mL with potassium chloride 20 mEq infusion 75 mL/hr at 07/19/13 0110    MEDICATIONS  . aspirin  300 mg Rectal Daily  . enoxaparin (LOVENOX) injection  40 mg Subcutaneous Daily  . pantoprazole (PROTONIX) IV  40 mg Intravenous Q24H  . piperacillin-tazobactam (ZOSYN)  IV  3.375 g Intravenous Q8H   PRN:  hydrALAZINE, ondansetron (ZOFRAN) IV  Diet:  NPO  Activity:  Up with assistance DVT Prophylaxis:  Lovenox  CLINICALLY  SIGNIFICANT STUDIES Basic Metabolic Panel:   Recent Labs Lab 07/17/13 0403 07/18/13 0556 07/19/13 0830  NA 145 144 144  K 3.2* 3.6* 3.4*  CL 104 104 105  CO2 23 23 21   GLUCOSE 122* 132* 115*  BUN 10 22 28*  CREATININE 0.78 1.98* 2.50*  CALCIUM 9.4 9.3 9.2  MG  --  2.0  --    Liver Function Tests:   Recent Labs Lab 07/15/13 1828 07/16/13 0745  AST 18 17  ALT 19 18  ALKPHOS 89 84  BILITOT 0.4 0.6  PROT 7.3 7.0  ALBUMIN 3.4* 3.3*   CBC:   Recent Labs Lab 07/15/13 1828 07/16/13 0745  07/18/13 0556 07/19/13 0830  WBC 10.7* 10.6*  < > 17.9* 15.3*  NEUTROABS 6.9 8.1*  --   --   --   HGB 14.6 14.7  < > 15.6 15.3  HCT 41.5 42.0  < > 43.8 43.9  MCV 88.9 88.4  < > 87.6 88.7  PLT 217 200  < > 254 252  < > = values in this interval not displayed. Coagulation:   Recent Labs Lab 07/16/13 0745  LABPROT 13.8  INR 1.08   Cardiac Enzymes: No results found for this basename: CKTOTAL, CKMB, CKMBINDEX, TROPONINI,  in the last 168 hours Urinalysis:   Recent Labs Lab 07/15/13 1858  COLORURINE YELLOW  LABSPEC 1.012  PHURINE 7.5  GLUCOSEU NEGATIVE  HGBUR TRACE*  BILIRUBINUR NEGATIVE  KETONESUR NEGATIVE  PROTEINUR NEGATIVE  UROBILINOGEN 0.2  NITRITE NEGATIVE  LEUKOCYTESUR LARGE*   Lipid Panel    Component Value Date/Time   CHOL 150 07/16/2013 0240   TRIG 102 07/16/2013 0240   HDL 43 07/16/2013 0240   CHOLHDL 3.5 07/16/2013 0240   VLDL 20 07/16/2013 0240   LDLCALC 87 07/16/2013 0240   HgbA1C  Lab Results  Component Value Date   HGBA1C 6.0* 07/16/2013    Urine Drug Screen:   No results found for this basename: labopia,  cocainscrnur,  labbenz,  amphetmu,  thcu,  labbarb    Alcohol Level: No results found for this basename: ETH,  in the last 168 hours  Dg Chest 1 View 07/15/2013    Right upper lobe pneumonia.    Dg Chest 2 View 07/16/2013    There is a stable appearance of the right upper lobe pneumonia. No definite infiltrate elsewhere is demonstrated.     MRI /  Mra Head Wo Contrast 07/15/2013    Acute nonhemorrhagic infarct posterior right lenticular nucleus extending into the a posterior right coronal radiata.  Remote large left hemispheric infarct.  Chronically occluded left internal carotid artery.  Intracranial atherosclerotic type changes.  Dg Swallowing Func-speech Pathology 07/16/2013   Recommend continue NPO status with trial of ST for facilitation of oral ROM.   Treatment Recommendation  Therapy as outlined in treatment plan below    Diet Recommendation NPO   Other  Recommendations Oral Care Recommendations: Oral care Q4  per protocol Follow Up Recommendations  Skilled Nursing facility    Frequency and Duration min 1 x/week  2 weeks     2D Echocardiogram  ejection fraction 60-65%. No cardiac source of emboli was identified.  Carotid Doppler  Findings suggest 1-39% right internal carotid artery stenosis. The left internal carotid artery is occluded. Vertebral arteries are patent with antegrade flow.  EKG  sinus rhythm rate 72 beats per minute. For complete results please see formal report.   Therapy Recommendations - skilled nursing facility recommended.  Physical Exam   Mental Status:  Patient is awake, alert, unable to answer orientation questions do to severe dysarthria  He does follow commands briskly and appears to understand and answers with head shakes/nods.  Cranial Nerves:  II: Visual Fields are limited in the right upper visual field. Pupils are equal, round, and reactive to light. Discs are difficult to visualize.  III,IV, VI: EOMI without ptosis or diploplia.  V: Facial sensation is decreased on right  VII: Facial movement is notable for bilateral weakness.  VIII: hearing is intact to voice  X: Uvula elevates symmetrically  XI: Shoulder shrug is symmetric.  XII: tongue is midline without atrophy or fasciculations.  Motor:  Tone is normal. Bulk is normal. 5/5 strength was present proximally in the left arm, as well as left leg.  He has spastic 4/5 weakness of his right arm and leg. He has impaired fine motor control of his left hand.  Sensory:  Sensation is decreased on the right  Deep Tendon Reflexes:  3+ in the biceps and patellae on the right, 2+ on left  Cerebellar:  Difficulty with finger-nose-finger on the left, unable to perform on the right.  Gait:  Not tested due to patient safety concern   ASSESSMENT [name] [name] is a 79 y.o. male presenting with difficulty speaking and swallowing. TPA was not administered  as the patient was outside the window for treatment . An MRI revealed an acute nonhemorrhagic infarct posterior right lenticular nucleus extending into the a posterior right coronal radiata.  Remote large left hemispheric infarct.  Chronically occluded left internal carotid artery. Infarct felt to be thrombotic. On Plavix 75 mg daily prior to admission. Now on aspirin suppository 300 mg daily for secondary stroke prevention. Patient with resultant severe dysarthria, dysphagia, and impaired fine motor movement of the left hand. Work up completed.   Right lower lobe pneumonia  Leukocytosis  Previous LMCA CVA  Hospital day # 4  TREATMENT/PLAN  Continue aspirin 300 mg suppository daily for secondary stroke prevention. Plan change back to Plavix once able to swallow or tube placed  Recommend use of NS as dextrose contraindicated in acute stroke due to risk for increasing cerebal edema  Return to Skilled nursing facility at discharge  Wife leaning toward tube placement; she hopes for improvement  No further stroke workup indicated.  Patient has a 10-15% risk of having another stroke over the next year, the highest risk is within 2 weeks of the most recent stroke/TIA (risk of having a stroke following a stroke or TIA is the same).  Ongoing risk factor control by Primary Care Physician  Stroke Service will sign off. Please call should any needs arise.  Follow up with Dr. Leonie Man, [facility], in 2 months.   Burnetta Sabin, MSN, RN, ANVP-BC, ANP-BC, [facility] Pager: [phone] 07/19/2013 4:23 PM  I have personally obtained a history, examined the patient, evaluated imaging results, and formulated the assessment and plan of care. I agree with the above. Antony Contras, MD

## 2013-07-19 NOTE — Progress Notes (Signed)
Patient BM:WUXLKG:Kyuss Pattricia BossG Bruso      DOB: 12/24/1934      MWN:[id number]   Palliative Medicine Team at [facility] Progress Note    Subjective: Patient awake alert, remains aphasic.  Congestion persistent this am.  Spouse leaning toward feeding tube placment. Provided Hard Choices book for Mrs. Kennedy BuckerLunsford, await PT and speech evals. Filed Vitals:   07/19/13 1047  BP: 157/92  Pulse: 80  Temp: 98.9 F (37.2 C)  Resp: 20   General : Minimal  distress. Remains aphasic, slight respiratory distress today  PERRL EOmi, anciteric, mm dry , less upper airway rhonchi  Chest : crackles anterior left greater than right chest no wheezing, no snoring  CVS: regular, S1, S2  Abdomen: soft, not tender,  Ext: thin with no edema  Neuro: expressive aphasia  Assessment and plan: 79 yr old s/p acute lenticular infarction, and right sided upper lobe pneumonia. Spouse conflicted about course of care. Patient has survived other strokes but admittedly she states "this one is different". Agree to continue antibiotics and reassess on Monday . Will readdress feeding tube issue at that time. [name] needs time to process.  1. Dnr  2. Pneumonia NPO continue abx  3. PT/OT/SLP reeval today then decisions can be made.  4.  Hard choices for loving people provided to [name].  Aurea Aronov L. Ladona Ridgelaylor, MD MBA The Palliative Medicine Team at [facility] Team Phone: [phone] Pager: [phone]  Total time 15 min

## 2013-07-19 NOTE — Progress Notes (Signed)
CSW spoke with Paula ComptonKarla at [facility]- they will accept patient back when medically ready.  CSW will monitor and assist with d/c when stable.  Lorri Frederickonna T. West PughCrowder, LCSWA  [phone]

## 2013-07-19 NOTE — Progress Notes (Signed)
Physical Therapy Treatment Patient Details Name: [name] [name] MRN: [id number] DOB: 02/01/1935 Today's Date: 07/19/2013 Time: [id number] PT Time Calculation (min): 18 min  PT Assessment / Plan / Recommendation  History of Present Illness [name] [name] is a 79 y.o. male with a history of a left MCA stroke in 1995 as well as recurrent urinary tract infections who was in his normal state on 07/15/2013 but was noticed around 10 AM that day by his wife to have difficulty speaking. She also noticed that he choked when given some water.     PT Comments   Pt with increased WOB on arrival today.  Pt sat EOB eliciting coughing and need for suctioning out thick secretions.  RN called and pt repositioned in bed.  Pt continues increased WOB.  Will continue to follow.    Follow Up Recommendations  SNF     Does the patient have the potential to tolerate intense rehabilitation     Barriers to Discharge        Equipment Recommendations  None recommended by PT    Recommendations for Other Services    Frequency Min 3X/week   Progress towards PT Goals Progress towards PT goals: Progressing toward goals  Plan Current plan remains appropriate    Precautions / Restrictions Precautions Precautions: Fall Restrictions Weight Bearing Restrictions: No   Pertinent Vitals/Pain Did not indicate pain, but visibly uncomfortable while sitting EOB coughing.      Mobility  Bed Mobility Overal bed mobility: Needs Assistance;+2 for physical assistance Bed Mobility: Supine to Sit;Sit to Supine Supine to sit: Total assist;+2 for physical assistance Sit to supine: Total assist;+2 for physical assistance General bed mobility comments: pt again attempts to participate with bed mobility, but limited by overall weakness and respiratory status.   Modified Rankin (Stroke Patients Only) Pre-Morbid Rankin Score: Severe disability Modified Rankin: Severe disability    Exercises     PT Diagnosis:    PT Problem  List:   PT Treatment Interventions:     PT Goals (current goals can now be found in the care plan section) Acute Rehab PT Goals Patient Stated Goal: To hopefully get better per wife Time For Goal Achievement: 07/30/13 Potential to Achieve Goals: Fair  Visit Information  Last PT Received On: 07/19/13 Assistance Needed: +2 History of Present Illness: [name] [name] is a 79 y.o. male with a history of a left MCA stroke in 1995 as well as recurrent urinary tract infections who was in his normal state on 07/15/2013 but was noticed around 10 AM that day by his wife to have difficulty speaking. She also noticed that he choked when given some water.      Subjective Data  Patient Stated Goal: To hopefully get better per wife   Cognition  Cognition Arousal/Alertness: Lethargic Behavior During Therapy: Flat affect Overall Cognitive Status: Impaired/Different from baseline Area of Impairment: Following commands Following Commands: Follows one step commands inconsistently;Follows one step commands with increased time General Comments: Pt attempted to follow simple one step motor commands, but deomonstrates difficulty executing them due to generalized weakness    Balance  Balance Overall balance assessment: Needs assistance Sitting-balance support: Bilateral upper extremity supported;Feet supported Sitting balance-Leahy Scale: Zero  End of Session PT - End of Session Equipment Utilized During Treatment: Oxygen Activity Tolerance: Patient limited by fatigue (Limited by respiratory status.  ) Patient left: in bed;with call bell/phone within reach;with nursing/sitter in room;with family/visitor present;with bed alarm set Nurse Communication: Mobility status;Need for lift equipment  GP     [name] [name], Alison Murray, Brent [phone] 07/19/2013, 1:50 PM

## 2013-07-19 NOTE — Progress Notes (Signed)
Speech Language Pathology Treatment: Dysphagia;Cognitive-Linquistic  Patient Details Name: Monico Hoardward G Salatino MRN: [id number] DOB: 08/30/1934 Today's Date: 07/19/2013 Time: [id number] SLP Time Calculation (min): 45 min  Assessment / Plan / Recommendation Clinical Impression  Pt seen for f/u dysphagia and cognitive-linguistic treatment. Pt remains nonverbal and does not answer yes/no questions, although with phonation noted x1 spontaneously and with reflexive cough (suspect aspiration of secretions) throughout session. Sonorous inhalations noted with audible congestion that pt is unable to orally expectorate despite SLP assist for suctioning. Pt was repositioned to maximize alertness and swallowing safety, however would not orally accept a dry spoon. SLP provided Max multimodal cues for lingual/labial movements, however pt is unable to follow commands at this time.  Pt's wife was present throughout session and educated regarding current status, with aspiration risk remaining high at this time. Recommend to continue NPO status. Wife was emotional but in agreement;  SLP provided emotional support. Recommend to continue SLP f/u to attempt oral motor and pharyngeal strengthening exercises and to assess PO readiness.   HPI HPI: 79 y.o. male with Past medical history of coronary artery disease, hypertension, recurrent CVA 501996 & 2006 with expressive aphasia, recurrent fall, dyslipidemia, urinary incontinence. recurrent UTI SNF.  The patient presents with episode of speech difficulty and drop off in the of his mouth that was noted around 10:00.the history was obtained from patient's wife as the patient has aphasia.on 8:30 in the morning the wife saw the patient has fine and at his baseline.when she returned around 10:00 she found that the patient was sitting in his chair, with his head down and drooling from his mouth. He also started having some cough and a chest x-ray was performed there which showed pneumonia.   As per the wife at his baseline the patient is not ambulating and requires significant assistance for his ADL occasionally can speak some words.  MRI Acute nonhemorrhagic infarct posterior right lenticular nucleus extending into the a posterior right coronal radiata. Remote large left hemispheric infarct.  CXR There is a stable appearance of the right upper lobe pneumonia. No definite infiltrate elsewhere is demonstrated.  Wife reported pt. observed to have recent dysphagia with coughing after liquids, expectorating pill from pharynx after several hours.   Pertinent Vitals N/A  SLP Plan  Continue with current plan of care    Recommendations Diet recommendations: NPO Medication Administration: Via alternative means              Oral Care Recommendations: Oral care Q4 per protocol Follow up Recommendations: Skilled Nursing facility Plan: Continue with current plan of care    GO       Maxcine HamLaura Paiewonsky, M.A. CCC-SLP [phone]  Maxcine Hamaiewonsky, Trevyon Swor 07/19/2013, 11:22 AM

## 2013-07-20 LAB — BASIC METABOLIC PANEL
BUN: 29 mg/dL — ABNORMAL HIGH (ref 6–23)
CALCIUM: 9.4 mg/dL (ref 8.4–10.5)
CO2: 23 mEq/L (ref 19–32)
CREATININE: 2.08 mg/dL — AB (ref 0.50–1.35)
Chloride: 109 mEq/L (ref 96–112)
GFR calc Af Amer: 34 mL/min — ABNORMAL LOW (ref >90)
GFR calc non Af Amer: 29 mL/min — ABNORMAL LOW (ref >90)
Glucose, Bld: 120 mg/dL — ABNORMAL HIGH (ref 70–99)
Potassium: 3.3 mEq/L — ABNORMAL LOW (ref 3.7–5.3)
SODIUM: 151 meq/L — AB (ref 137–147)

## 2013-07-20 LAB — GLUCOSE, CAPILLARY
Glucose-Capillary: 116 mg/dL — ABNORMAL HIGH (ref 70–99)
Glucose-Capillary: 117 mg/dL — ABNORMAL HIGH (ref 70–99)
Glucose-Capillary: 119 mg/dL — ABNORMAL HIGH (ref 70–99)
Glucose-Capillary: 121 mg/dL — ABNORMAL HIGH (ref 70–99)

## 2013-07-20 LAB — CBC
HCT: 44.8 % (ref 39.0–52.0)
Hemoglobin: 15.8 g/dL (ref 13.0–17.0)
MCH: 31.3 pg (ref 26.0–34.0)
MCHC: 35.3 g/dL (ref 30.0–36.0)
MCV: 88.7 fL (ref 78.0–100.0)
PLATELETS: 302 10*3/uL (ref 150–400)
RBC: 5.05 MIL/uL (ref 4.22–5.81)
RDW: 13.2 % (ref 11.5–15.5)
WBC: 17.3 10*3/uL — AB (ref 4.0–10.5)

## 2013-07-20 MED ORDER — ENOXAPARIN SODIUM 40 MG/0.4ML ~~LOC~~ SOLN
40.0000 mg | Freq: Every day | SUBCUTANEOUS | Status: DC
  Administered 2013-07-20 – 2013-07-21 (×2): 40 mg via SUBCUTANEOUS
  Filled 2013-07-20 (×2): qty 0.4

## 2013-07-20 MED ORDER — MORPHINE SULFATE (CONCENTRATE) 10 MG /0.5 ML PO SOLN: 4.0000 mg | ORAL | Status: AC | PRN

## 2013-07-20 NOTE — Progress Notes (Signed)
Speech Language Pathology Treatment: Cognitive-Linquistic (oral care/differential diagnosis for swallow)  Patient Details Name: [name] [name] MRN: [id number] DOB: 07/10/1934 Today's Date: 07/20/2013 Time: [id number] SLP Time Calculation (min): 19 min  Assessment / Plan / Recommendation Clinical Impression  Pt. Has significantly declined since last seen by SLP 2/6. Copious dried mucous on hard/soft palate observed due to open mouth respiratory posture, decreased amount and quality of secretions to break down bacteria (with NPO status).  Thorough oral care performed to remove 75% mucous.  Pt. demonstrated mostly flat affect/blank stares without ability to follow commands given cues and extra time.  He did slightly shake head "no" to questions with delay x 2.  Unable to elicit volitional phonation with prompt.  Several apneic periods observed.  SLP spoke with RN and reiterated to continue intense oral care.  Will follow up for progress/education, however, prognosis unfortunately appears poor.  Wife not present during session.     HPI HPI: 79 y.o. male with Past medical history of coronary artery disease, hypertension, recurrent CVA 431996 & 2006 with expressive aphasia, recurrent fall, dyslipidemia, urinary incontinence. recurrent UTI SNF.  The patient presents with episode of speech difficulty and drop off in the of his mouth that was noted around 10:00.the history was obtained from patient's wife as the patient has aphasia.on 8:30 in the morning the wife saw the patient has fine and at his baseline.when she returned around 10:00 she found that the patient was sitting in his chair, with his head down and drooling from his mouth. He also started having some cough and a chest x-ray was performed there which showed pneumonia.  As per the wife at his baseline the patient is not ambulating and requires significant assistance for his ADL occasionally can speak some words.  MRI Acute nonhemorrhagic infarct  posterior right lenticular nucleus extending into the a posterior right coronal radiata. Remote large left hemispheric infarct.  CXR There is a stable appearance of the right upper lobe pneumonia. No definite infiltrate elsewhere is demonstrated.  Wife reported pt. observed to have recent dysphagia with coughing after liquids, expectorating pill from pharynx after several hours.   Pertinent Vitals Apneic periods  SLP Plan  Continue with current plan of care    Recommendations Diet recommendations: NPO              Oral Care Recommendations: Oral care Q4 per protocol Follow up Recommendations: Skilled Nursing facility Plan: Continue with current plan of care    GO     [name] MacadamiaLisa Willis Lyncoln Ledgerwood M.Ed [employer]-SLP Pager [phone]  07/20/2013

## 2013-07-20 NOTE — Progress Notes (Signed)
CSW notified by Dr. Ladona Ridgelaylor of recommendation that patient be transferred to residential hospice home for continued care.  CSW spoke with patient's wife Annabelle HarmanDana- and residential hospice home choices provided. She prefers either [facility] or [facility]. Referral completed to both facilities; at this time there are no vacancies at either facility at this time but patient is on "wait" list.  CSW will check in with both facilities in the a.m to determine possible vacancies.  Should there still not be openings- will need to re-consider return to [facility] with Hospice referral and patient can remain on wait list for Hospice Home.  Wife does not with to consider any other residential hospice home due to distance and inability to get to visit patient.  Lorri Frederickonna T. West PughCrowder, LCSWA  [phone]

## 2013-07-20 NOTE — Progress Notes (Signed)
Patient UJ:WJXBJY:[name] [name]      DOB: 07/25/1934      NWG:[id number]   Palliative Medicine Team at [facility] Progress Note    Subjective: [name] [name] remains very congested.  He is awake and looking around but can't clear secretions well and did not do well with speech yesterday.  [name] [name] has decided not to pursue a feeding tube and wants to transition to the hospice home setting to spend his last days. Dr. Arbutus Leasat was updated.    Filed Vitals:   07/20/13 0500  BP: 164/76  Pulse: 94  Temp: 97.9 F (36.6 C)  Resp: 20   Physical exam:  General : dry mouth, snoring intermittently  Chest: decreased with rhonchi,left greater than right CVS: regular, S1, s2 no s3 Abd: soft , not tender Ext not moving left hand much, moves legs  Neuro:not able to follow commands   Prognosis: days to week as spouse has decided to pursue full comfort care and will not be placing a feeding tube     Assessment and plan: 79 yr old s/p acute lenticular infarction, and right sided upper lobe pneumonia. Spouse conflicted about course of care. Patient has survived other strokes but admittedly she states "this one is different". Agree to continue antibiotics and reassess on Monday . Will readdress feeding tube issue at that time. [name] [name] needs time to process.  1. Dnr  2. Pneumonia NPO continue abx until discharge. 3.  Dysphagia: spouse has decided not to purse a feeding tube.  She feels that [name] [name] would not want to just be kept alive.   Total time 25 min 850 am -915 am   Seger Jani L. Ladona Ridgelaylor, MD MBA The Palliative Medicine Team at [facility] Team Phone: [phone] Pager: [phone]

## 2013-07-20 NOTE — Discharge Summary (Signed)
Physician Discharge Summary  [name] [name] ZOX:[id number] DOB: [birth year] DOA: 07/15/2013  PCP: Rivka Spring, MD  Admit date: 07/15/2013 Discharge date: 07/20/2013  Discharge to residential hospice  Discharge Diagnoses:  Principal Problem:   CVA (cerebral infarction) Active Problems:   HLD (hyperlipidemia)   History of CVA (cerebrovascular accident)   Accelerated hypertension   AKI (acute kidney injury)   HCAP (healthcare-associated pneumonia)   Aspiration into airway   Recurrent falls   Acute ischemic stroke   Aspiration pneumonia   Dysphagia, unspecified(787.20) Acute nonhemorrhagic infarct  -MRI brain acute right lenticular nucleus infarct  -MRA of brain shows intracranial atherosclerotic vascular disease  -Carotid Dopplers shows occluded left internal carotid--this has been chronic and previously known  -Echocardiogram shows EF 60-65%, grade 1 diastolic dysfunction, no source of emboli  -Discussed with Dr. Myrtie Soman plavix  -While the patient is n.p.o. due to his dysphagia, continue aspirin suppository  Aspiration pneumonia/HCAP  -Swallow evaluation--failed  -MBS--severe oral dysphagia--> remain n.p.o.  -Repeat speech therapy evaluation continues to recommend npo  -Discontinue cefepime  -continue Zosyn until time of d/c -Discontinue vancomycin  AKI  -check FeNa--0.64%  -continue judicious fluids  -renal US  -??due to vancomycin  Cheyne-Stokes respirations  -Secondary to the patient's strokes  -His respirations appear more sonorous intermittenly due to his pattern of respirations  Hypertension  -When necessary hydralazine when SBP >180  Hyperlipidemia  -Start Lipitor due to drug interaction with simvastatin and amlodipine  -LDL 87  Goals of care  -discussed with Dr. Ladona Ridgel  -DNR  -wife decided no G-tube. Desired transition to residential hospice.  As a result all nonessential meds were discontinued in favor of a change in focus of care to comfort. Pyuria   -Urine culture unrevealing, not suggestive of UTI  Hypernatremia  -continue  on D5 half-normal saline  Hypokalemia  -Repleted  -Check magnesium--2.0  Family Communication: wife at beside  Disposition Plan: SNF  Antibiotics:  Vancomycin 07/16/2013>>>07/18/2013  Zosyn 07/16/13>>>   Discharge Condition: stable  Disposition:  Follow-up Information   Follow up with Gates Rigg, MD. Schedule an appointment as soon as possible for a visit in 2 months. (stroke clinic)    Specialties:  Neurology, Radiology   Contact information:   [address] [place] [address] [phone]       Diet:pleasure feeds Wt Readings from Last 3 Encounters:  07/15/13 76.975 kg (169 lb 11.2 oz)  07/15/13 80.196 kg (176 lb 12.8 oz)  06/18/13 79.4 kg (175 lb 0.7 oz)    History of present illness:   80 y.o. male with a history of a left MCA stroke in 1995 as well as recurrent urinary tract infections who was in his normal state earlier today but was noticed around 10 AM 07/15/2013 by his wife to have difficulty speaking. She also noticed that he chokes when given some water. She asked to see the doctor at his nursing home, but when this was taking too long, she has to be transferred to [facility] come and was therefore transported here. Once here, he received an MRI which confirms a right lenticular infarct Of note, following his stroke in 1995, he had had good improvement. He had never returned to speaking normally, but had been able to drive with some assistive devices. Patient was not administerd TPA secondary to delay in arrival. He was admitted for further evaluation and treatment. The patient was evaluated by speech therapy. Neurology was consulted. Full stroke workup was undertaken. MRI of the brain  showed acute non-hemorrhagic infarct of the right lenticular nucleus. He was deemed to have significant dysphagia. He was made npo. Essential medications were converted to IV form. The patient  was started on IV fluids. Modified barium swallow demonstrated significant dysphagia. Palliative medicine was consulted to assist in discussing goals of care. The patient was subsequently changed to DO NOT RESUSCITATE. Repeat speech evaluation continued to demonstrate significant dysphagia. The patient was continued on rectal aspirin during his stay. The patient was found to have right upper lobe infiltrate. This was consistent with aspiration pneumonia. The patient was started on vancomycin and Zosyn empirically. Blood cultures were obtained. Blood cultures remained negative. The patient was continued on Zosyn. Vancomycin was discontinued. The patient did develop renal insufficiency. This was thought to be due to his vancomycin. His renal function improved on vancomycin was discontinued. The patient was continued on IV fluids. After further discussion with palliative medicine, the patient's wife decided that it was not in his best interest to pursue a gastrostomy tube. She desired a transition to residential hospice care. All of his nonessential medications were discontinued with a focus on comfort care. The patient was continued on Zosyn until this time of discharge.   Consultants: Neurology Palliative medicine  Discharge Exam: Filed Vitals:   07/20/13 0500  BP: 164/76  Pulse: 94  Temp: 97.9 F (36.6 C)  Resp: 20   Filed Vitals:   07/19/13 1809 07/19/13 2059 07/20/13 0152 07/20/13 0500  BP: 172/93 177/105 153/79 164/76  Pulse: 88 90 89 94  Temp: 97.5 F (36.4 C) 98.4 F (36.9 C) 98 F (36.7 C) 97.9 F (36.6 C)  TempSrc: Axillary Oral Oral Oral  Resp: 20 18 18 20   Height:      Weight:      SpO2: 97% 96% 97% 99%   General: Alert and awake, NAD, pleasant, cooperative Cardiovascular: RRR, no rub, no gallop, no S3 Respiratory: Scattered upper airway rhonchi. No wheezes. Good air movement. Abdomen:soft, nontender, nondistended, positive bowel sounds Extremities: No edema, No  lymphangitis, no petechiae  Discharge Instructions   Future Appointments Provider Department Dept Phone   07/21/2013 9:30 AM Newt Lukes, MD [facility] [phone]   12/28/2013 9:30 AM [facility] [address] [phone]   Eat a light meal the night before the exam Nothing to eat or drink for at least 8 hours before exam No gum chewing, or smoking the morning of the exam. Please take your morning medications with small sips of water, especially blood pressure medication *Very Important* Please wear 2 piece clothing   12/28/2013 10:00 AM [facility] [address] [phone]   12/28/2013 11:40 AM Carma Lair Nickel, NP [facility] [phone]   01/03/2014 2:30 PM Levert Feinstein, MD [facility] [phone]       Medication List    STOP taking these medications       amLODipine 10 MG tablet  Commonly known as:  NORVASC     CERTA-VITE PO     clopidogrel 75 MG tablet  Commonly known as:  PLAVIX     docusate sodium 100 MG capsule  Commonly known as:  COLACE     dutasteride 0.5 MG capsule  Commonly known as:  AVODART     flavoxATE 100 MG tablet  Commonly known as:  URISPAS     HYDROcodone-acetaminophen 5-325 MG per tablet  Commonly known as:  NORCO/VICODIN     oxybutynin  5 MG 24 hr tablet  Commonly known as:  DITROPAN-XL     polyethylene glycol packet  Commonly known as:  MIRALAX / GLYCOLAX     psyllium 0.52 G capsule  Commonly known as:  REGULOID     senna 8.6 MG Tabs tablet  Commonly known as:  SENOKOT     tamsulosin 0.4 MG Caps capsule  Commonly known as:  FLOMAX     zinc oxide 11.3 % Crea cream  Commonly known as:  BALMEX      TAKE these medications       morphine CONCENTRATE 10 mg / 0.5 ml concentrated solution  Take 0.2 mLs (4 mg total) by mouth every 3 (three) hours as needed for severe pain.         The results of  significant diagnostics from this hospitalization (including imaging, microbiology, ancillary and laboratory) are listed below for reference.    Significant Diagnostic Studies: Dg Chest 1 View  07/15/2013   CLINICAL DATA:  Cerebral vascular accident.  Unable to verbalize.  EXAM: CHEST - 1 VIEW  COMPARISON:  DG CHEST 2 VIEW dated 02/15/2013  FINDINGS: Mildly enlarged cardiac silhouette. There is peripheral airspace disease in right upper lobes consistent with pneumonia. No pleural fluid. No pneumothorax. Vascular clips in the right neck.  IMPRESSION: Right upper lobe pneumonia.   Electronically Signed   By: Genevive BiStewart  Edmunds M.D.   On: 07/15/2013 20:17   Dg Chest 2 View  07/16/2013   CLINICAL DATA:  Dyspnea and stroke  EXAM: CHEST  2 VIEW  COMPARISON:  DG CHEST 1 VIEW dated 07/15/2013  FINDINGS: The lungs remain mildly hypoinflated. Confluent interstitial density just above the minor fissure on the frontal film appears to lie posteriorly in the inferior aspect of the upper lobe on the lateral film. The cardiac silhouette is mildly enlarged and stable. The pulmonary vascularity is prominent centrally.  IMPRESSION: There is a stable appearance of the right upper lobe pneumonia. No definite infiltrate elsewhere is demonstrated.   Electronically Signed   By: Gemayel Mascio  [place]   On: 07/16/2013 08:08   Mr Maxine GlennMra Head Wo Contrast  07/15/2013   CLINICAL DATA:  Not able to speak since this morning. Two prior strokes with right-sided deficits. Hypertension.  EXAM: MRI HEAD WITHOUT CONTRAST  MRA HEAD WITHOUT CONTRAST  TECHNIQUE: Multiplanar, multiecho pulse sequences of the brain and surrounding structures were obtained without intravenous contrast. Angiographic images of the head were obtained using MRA technique without contrast.  COMPARISON:  02/15/2013 CT.  12/25/2004 MR.  FINDINGS: MRI HEAD FINDINGS  Acute nonhemorrhagic infarct posterior right lenticular nucleus extending into the a posterior right coronal radiata.  Remote  large left hemispheric infarct with encephalomalacia and subsequent dilation of the left lateral ventricle. Small amount of blood breakdown products along the periphery of this remote infarct.  Remote coronal radiata infarcts and basal ganglia infarcts.  Small vessel disease type changes.  Tiny area of blood breakdown products right thalamus most likely related to prior hemorrhage ischemia. Otherwise no evidence of intracranial hemorrhage.  Global atrophy without hydrocephalus.  No intracranial mass lesion noted on this unenhanced exam.  Chronically occluded left internal carotid artery. Please see below.  Transverse ligament hypertrophy. Cervical medullary junction, pituitary region, pineal region and orbital structures unremarkable.  MRA HEAD FINDINGS  Chronically occluded left internal carotid artery. Minimal reconstitution of flow to portions of the left middle cerebral artery. The branches which have flow are markedly diminutive size and irregular.  Mild irregularity  and narrowing of the supraclinoid segment of the right internal carotid artery.  No high-grade stenosis of the right carotid terminus, M1 segment or A1 segment of the right internal carotid artery or right anterior cerebral artery. Partial filling of left anterior cerebral artery A2 segment from the right.  Codominant vertebral arteries with mild narrowing of the distal vertebral artery slightly greater on the left.  Mild narrowing mid aspect of the basilar artery.  Non visualization right posterior inferior cerebellar artery.  Small irregular left anterior inferior cerebellar artery.  Mild irregularity of the left superior cerebellar artery.  No aneurysm noted.  IMPRESSION: Acute nonhemorrhagic infarct posterior right lenticular nucleus extending into the a posterior right coronal radiata.  Remote large left hemispheric infarct.  Chronically occluded left internal carotid artery.  Intracranial atherosclerotic type changes otherwise as detailed  above.  Please see above for additional findings.  These results were called by telephone at the time of interpretation on 07/15/2013 at 10:03 PM to Dr. Wilkie Aye , who verbally acknowledged these results.   Electronically Signed   By: Bridgett Larsson M.D.   On: 07/15/2013 22:05   Mr Brain Wo Contrast  07/15/2013   CLINICAL DATA:  Not able to speak since this morning. Two prior strokes with right-sided deficits. Hypertension.  EXAM: MRI HEAD WITHOUT CONTRAST  MRA HEAD WITHOUT CONTRAST  TECHNIQUE: Multiplanar, multiecho pulse sequences of the brain and surrounding structures were obtained without intravenous contrast. Angiographic images of the head were obtained using MRA technique without contrast.  COMPARISON:  02/15/2013 CT.  12/25/2004 MR.  FINDINGS: MRI HEAD FINDINGS  Acute nonhemorrhagic infarct posterior right lenticular nucleus extending into the a posterior right coronal radiata.  Remote large left hemispheric infarct with encephalomalacia and subsequent dilation of the left lateral ventricle. Small amount of blood breakdown products along the periphery of this remote infarct.  Remote coronal radiata infarcts and basal ganglia infarcts.  Small vessel disease type changes.  Tiny area of blood breakdown products right thalamus most likely related to prior hemorrhage ischemia. Otherwise no evidence of intracranial hemorrhage.  Global atrophy without hydrocephalus.  No intracranial mass lesion noted on this unenhanced exam.  Chronically occluded left internal carotid artery. Please see below.  Transverse ligament hypertrophy. Cervical medullary junction, pituitary region, pineal region and orbital structures unremarkable.  MRA HEAD FINDINGS  Chronically occluded left internal carotid artery. Minimal reconstitution of flow to portions of the left middle cerebral artery. The branches which have flow are markedly diminutive size and irregular.  Mild irregularity and narrowing of the supraclinoid segment of the right  internal carotid artery.  No high-grade stenosis of the right carotid terminus, M1 segment or A1 segment of the right internal carotid artery or right anterior cerebral artery. Partial filling of left anterior cerebral artery A2 segment from the right.  Codominant vertebral arteries with mild narrowing of the distal vertebral artery slightly greater on the left.  Mild narrowing mid aspect of the basilar artery.  Non visualization right posterior inferior cerebellar artery.  Small irregular left anterior inferior cerebellar artery.  Mild irregularity of the left superior cerebellar artery.  No aneurysm noted.  IMPRESSION: Acute nonhemorrhagic infarct posterior right lenticular nucleus extending into the a posterior right coronal radiata.  Remote large left hemispheric infarct.  Chronically occluded left internal carotid artery.  Intracranial atherosclerotic type changes otherwise as detailed above.  Please see above for additional findings.  These results were called by telephone at the time of interpretation on 07/15/2013 at 10:03 PM  to Dr. Wilkie Aye , who verbally acknowledged these results.   Electronically Signed   By: Bridgett Larsson M.D.   On: 07/15/2013 22:05   US Renal  07/20/2013   CLINICAL DATA:  AKI, history of cystitis.  EXAM: RENAL/URINARY TRACT ULTRASOUND COMPLETE  COMPARISON:  CT abdomen pelvis June 13, 2013  FINDINGS: Right Kidney:  Length: 11.1 cm. Echogenicity within normal limits. No hydronephrosis visualized. There is a 9.3 x 7.8 x 2.5 cm cyst in the upper pole right kidney.  Left Kidney:  Length: 12.4 cm. Echogenicity within normal limits. No hydronephrosis visualized. There is a 2.1 x 1.6 x 1.8 cm cyst in the midpole left kidney.  Bladder:  There is debris within the bladder.  IMPRESSION: Simple cysts in bilateral kidneys. No hydronephrosis bilaterally. Debris within the bladder which can be seen in cystitis.   Electronically Signed   By: Sherian Rein M.D.   On: 07/20/2013 00:54   Dg Chest Port  1 View  07/18/2013   CLINICAL DATA:  Aspiration pneumonia.  Elevated white count.  EXAM: PORTABLE CHEST - 1 VIEW  COMPARISON:  07/16/2013  FINDINGS: Low lung volumes. Right base atelectasis. Heart is borderline in size. No effusions. No edema. No acute bony abnormality.  IMPRESSION: Low lung volumes.  Right base atelectasis.   Electronically Signed   By: Charlett Nose M.D.   On: 07/18/2013 17:16   Dg Swallowing Func-speech Pathology  07/16/2013   Breck Coons Anna, CCC-SLP     07/16/2013  4:33 PM Objective Swallowing Evaluation: Modified Barium Swallowing Study   Patient Details  Name: KALLON CAYLOR MRN: [id number] Date of Birth: 28-Oct-1934  Today's Date: 07/16/2013 Time: 1345-1500 SLP Time Calculation (min): 75 min  Past Medical History:  Past Medical History  Diagnosis Date  . Coronary artery disease   . Angina   . Hypertension   . Anemia     when had stomach ulcer  . Headache(784.0)   . Arthritis   . Abdominal aneurysm   . Disorder of heart muscle     "thickened heart muscle" per wife  . Carotid artery occlusion   . Bursitis, shoulder     left  . Dizzy spells   . Autonomic orthostatic hypotension   . Peripheral vertigo   . Cluster headaches   . Urinary incontinence   . Stroke 1995, 2006    R side weak, slurred speech, exp. asphasia  . Hx of bladder infections    Past Surgical History:  Past Surgical History  Procedure Laterality Date  . Tonsillectomy    . Hip arthroplasty  10/29/2011    Procedure: ARTHROPLASTY BIPOLAR HIP;  Surgeon: Shelda Pal,  MD;  Location: [facility];  Service: Orthopedics;  Laterality: Right;   . Fracture surgery      both hands  . Fracture surgery      Right arm X's 2, back  . Joint replacement  11/03/11    Right hip, Fx from a fall  . Carotid endarterectomy Right   . Hernia repair Left   . Liver biopsy      x2,    HPI:  79 y.o. male with Past medical history of coronary artery  disease, hypertension, recurrent CVA 64 & 2006 with expressive  aphasia, recurrent fall, dyslipidemia, urinary  incontinence.  recurrent UTI SNF.  The patient presents with episode of speech  difficulty and drop off in the of his mouth that was noted around  10:00.the history was obtained from patient's wife as  the patient  has aphasia.on 8:30 in the morning the wife saw the patient has  fine and at his baseline.when she returned around 10:00 she found  that the patient was sitting in his chair, with his head down and  drooling from his mouth. He also started having some cough and a  chest x-ray was performed there which showed pneumonia.  As per  the wife at his baseline the patient is not ambulating and  requires significant assistance for his ADL occasionally can  speak some words.  MRI Acute nonhemorrhagic infarct posterior  right lenticular nucleus extending into the a posterior right  coronal radiata. Remote large left hemispheric infarct.  CXR  There is a stable appearance of the right upper lobe pneumonia.  No definite infiltrate elsewhere is demonstrated.  Wife reported  pt. observed to have recent dysphagia with coughing after  liquids, expectorating pill from pharynx after several hours.     Assessment / Plan / Recommendation Clinical Impression  Dysphagia Diagnosis: Severe oral phase dysphagia Clinical impression: Pt. exhibited severe oral dysphagia  evidenced by glossopharygneal IX and vagus X involvement/apraxia  resulting in inability to efficiently transit bolus from anterior  to posterior oral cavity with assist (dry spoon).  Pt. alert and  attempting to propel puree by tilting head. One weak swallow was  initated with small amount of applesauce/barium propelled with  mild vallecular residue.  Barium was repeatedly suctioned from  oral cavity.  Recommend continue NPO status with trial of ST for  facilitation of oral ROM.      Treatment Recommendation  Therapy as outlined in treatment plan below    Diet Recommendation NPO        Other  Recommendations Oral Care Recommendations: Oral care Q4  per protocol    Follow Up Recommendations  Skilled Nursing facility    Frequency and Duration min 1 x/week  2 weeks   Pertinent Vitals/Pain WDL          Reason for Referral Objectively evaluate swallowing function   Oral Phase Oral Preparation/Oral Phase Oral Phase: Impaired Oral - Honey Oral - Honey Teaspoon: Weak lingual manipulation;Reduced  posterior propulsion;Holding of bolus;Right anterior bolus  loss;Incomplete tongue to palate contact;Right pocketing in  lateral sulci;Pocketing in anterior sulcus;Lingual/palatal  residue;Decreased velopharyngeal closure Oral - Solids Oral - Puree: Weak lingual manipulation;Reduced posterior  propulsion;Holding of bolus;Right anterior bolus loss;Incomplete  tongue to palate contact;Right pocketing in lateral  sulci;Pocketing in anterior sulcus;Lingual/palatal  residue;Decreased velopharyngeal closure   Pharyngeal Phase Pharyngeal Phase Pharyngeal Phase: Impaired Pharyngeal - Honey Pharyngeal - Honey Cup: Pharyngeal residue -  valleculae;Pharyngeal residue - pyriform sinuses (tongue base  residue)  Cervical Esophageal Phase    GO              Breck Coons Litaker M.Ed CCC-SLP Pager [phone]  07/16/2013     Microbiology: Recent Results (from the past 240 hour(s))  URINE CULTURE     Status: None   Collection Time    07/15/13  6:58 PM      Result Value Range Status   Specimen Description URINE, CLEAN CATCH   Final   Special Requests NONE   Final   Culture  Setup Time     Final   Value: 07/16/2013 00:58     Performed at [facility] Count     Final   Value: 30,000 COLONIES/ML     Performed at [facility]   Culture     Final  Value: Multiple bacterial morphotypes present, none predominant. Suggest appropriate recollection if clinically indicated.     Performed at [facility]   Report Status 07/16/2013 FINAL   Final  CULTURE, BLOOD (ROUTINE X 2)     Status: None   Collection Time    07/15/13  7:03 PM      Result Value Range Status    Specimen Description BLOOD LEFT FOREARM   Final   Special Requests BOTTLES DRAWN AEROBIC ONLY 8CC   Final   Culture  Setup Time     Final   Value: 07/16/2013 00:37     Performed at [facility]   Culture     Final   Value:        BLOOD CULTURE RECEIVED NO GROWTH TO DATE CULTURE WILL BE HELD FOR 5 DAYS BEFORE ISSUING A FINAL NEGATIVE REPORT     Performed at [facility]   Report Status PENDING   Incomplete  CULTURE, BLOOD (ROUTINE X 2)     Status: None   Collection Time    07/15/13  7:23 PM      Result Value Range Status   Specimen Description BLOOD HAND LEFT   Final   Special Requests     Final   Value: BOTTLES DRAWN AEROBIC AND ANAEROBIC 10CCBLUE 8CCRED   Culture  Setup Time     Final   Value: 07/16/2013 00:38     Performed at [facility]   Culture     Final   Value:        BLOOD CULTURE RECEIVED NO GROWTH TO DATE CULTURE WILL BE HELD FOR 5 DAYS BEFORE ISSUING A FINAL NEGATIVE REPORT     Performed at [facility]   Report Status PENDING   Incomplete     Labs: Basic Metabolic Panel:  Recent Labs Lab 07/16/13 0745 07/17/13 0403 07/18/13 0556 07/19/13 0830 07/20/13 0404  NA 144 145 144 144 151*  K 3.5* 3.2* 3.6* 3.4* 3.3*  CL 106 104 104 105 109  CO2 24 23 23 21 23   GLUCOSE 113* 122* 132* 115* 120*  BUN 7 10 22  28* 29*  CREATININE 0.70 0.78 1.98* 2.50* 2.08*  CALCIUM 8.6 9.4 9.3 9.2 9.4  MG  --   --  2.0  --   --    Liver Function Tests:  Recent Labs Lab 07/15/13 1828 07/16/13 0745  AST 18 17  ALT 19 18  ALKPHOS 89 84  BILITOT 0.4 0.6  PROT 7.3 7.0  ALBUMIN 3.4* 3.3*   No results found for this basename: LIPASE, AMYLASE,  in the last 168 hours  Recent Labs Lab 07/16/13 0010  AMMONIA 28   CBC:  Recent Labs Lab 07/15/13 1828 07/16/13 0745 07/17/13 0403 07/18/13 0556 07/19/13 0830 07/20/13 0404  WBC 10.7* 10.6* 14.1* 17.9* 15.3* 17.3*  NEUTROABS 6.9 8.1*  --   --   --   --   HGB 14.6 14.7 16.2 15.6 15.3 15.8   HCT 41.5 42.0 44.7 43.8 43.9 44.8  MCV 88.9 88.4 88.0 87.6 88.7 88.7  PLT 217 200 242 254 252 302   Cardiac Enzymes: No results found for this basename: CKTOTAL, CKMB, CKMBINDEX, TROPONINI,  in the last 168 hours BNP: No components found with this basename: POCBNP,  CBG:  Recent Labs Lab 07/19/13 1121 07/19/13 1200 07/19/13 1806 07/19/13 2338 07/20/13 0641  GLUCAP 141* 132* 111* 129* 116*    Time coordinating discharge:  Greater than 30 minutes  Signed:  Sylvi Rybolt, DO [facility] Pager: [phone] 07/20/2013, 11:01 AM

## 2013-07-20 NOTE — Consult Note (Signed)
[facility] Liaison: Received request from CSW for [facility]. Chart reviewed. [facility]. Will follow up with CSW in am for possible changes. Thank you. Forrestine Himva Einar Nolasco LCSW [phone]

## 2013-07-21 ENCOUNTER — Ambulatory Visit: Payer: Medicare Other | Admitting: Internal Medicine

## 2013-07-21 DIAGNOSIS — Z0289 Encounter for other administrative examinations: Secondary | ICD-10-CM

## 2013-07-21 LAB — GLUCOSE, CAPILLARY
GLUCOSE-CAPILLARY: 146 mg/dL — AB (ref 70–99)
GLUCOSE-CAPILLARY: 128 mg/dL — AB (ref 70–99)

## 2013-07-21 MED ORDER — POLYVINYL ALCOHOL 1.4 % OP SOLN
2.0000 [drp] | OPHTHALMIC | Status: DC | PRN
  Filled 2013-07-21: qty 15

## 2013-07-21 MED ORDER — BISACODYL 10 MG RE SUPP: 10.0000 mg | Freq: Every day | RECTAL | Status: DC | PRN

## 2013-07-21 MED ORDER — LORAZEPAM 2 MG/ML IJ SOLN
0.5000 mg | Freq: Four times a day (QID) | INTRAMUSCULAR | Status: DC | PRN
  Administered 2013-07-22: 0.5 mg via INTRAVENOUS
  Filled 2013-07-21: qty 1

## 2013-07-21 MED ORDER — OXYCODONE HCL 20 MG/ML PO CONC: 5.0000 mg | ORAL | Status: DC | PRN

## 2013-07-21 MED ORDER — POTASSIUM CHLORIDE 2 MEQ/ML IV SOLN
INTRAVENOUS | Status: DC
  Administered 2013-07-21: 11:00:00 via INTRAVENOUS
  Filled 2013-07-21 (×2): qty 1000

## 2013-07-21 MED ORDER — HYDROMORPHONE HCL PF 1 MG/ML IJ SOLN
0.5000 mg | INTRAMUSCULAR | Status: DC | PRN
  Administered 2013-07-21 – 2013-07-22 (×4): 0.5 mg via INTRAVENOUS
  Filled 2013-07-21 (×4): qty 1

## 2013-07-21 MED ORDER — OXYCODONE HCL 20 MG/ML PO CONC: 5.0000 mg | Freq: Every day | ORAL | Status: DC

## 2013-07-21 MED ORDER — SCOPOLAMINE 1 MG/3DAYS TD PT72
1.0000 | MEDICATED_PATCH | TRANSDERMAL | Status: DC
  Administered 2013-07-21: 1.5 mg via TRANSDERMAL
  Filled 2013-07-21: qty 1

## 2013-07-21 MED ORDER — OXYCODONE HCL 5 MG/5ML PO SOLN
5.0000 mg | Freq: Every day | ORAL | Status: DC
  Filled 2013-07-21: qty 5

## 2013-07-21 NOTE — Progress Notes (Signed)
CSW informed that patient has a bed at [facility] and can arrive at facility between 12-1 pm tomorrow.  CSW met with patient, his wife, and his step-daughter about [place] hospice. Wife is agreeable to plan. Wife appears to be strong support to patient and states that this has been a difficult time for her. She reports that having his living will to guide her in her decision making process has been helpful. Wife accepted bed offer at [facility].  Tilden Fossa, MSW, [place] Social Worker [phone]

## 2013-07-21 NOTE — Progress Notes (Addendum)
Palliative Medicine Team Progress Note  Met with wife and daughter at bedside. Patient having increasing dyspnea, with repositioning and movement. Cannot tolerate lying flat. Worsening upper airway secretions.   Filed Vitals:   07/21/13 1700  BP: 169/96  Pulse: 90  Temp: 99.7 F (37.6 C)  Resp: 18   Aphasic, Tracks, mild grimace. Not agitated. Decreased Breath sounds. Does not follow commands, but is awake.  Assessment/Plan:  79 yo with severe dysphagia secondary to acute stroke and prior strokes. Debilitated prior to admission. Patient has a living will that states no feeding tubes and allow for natural death to occur if he has severe permanent debility or terminal illness.   Terminal dysphagia, no PO tolerated, now NPO  Stopped IV fluids  He took oxycodone prior to [place] says he is Morphine allergic-will start scheduled and prn oxyfast sublingual for pain and dyspnea-he has pre-existing hip joint pain and tendonopathy.  Scop patch for upper airway secretions.  Stopped antibiotics.  Stopped rectal ASA  Comfort Care  Provided education and support to his wife regarding comfort care.  35 minutes. Patient has bed at [facility]. Prognosis <2 weeks. Rapid decline anticipated. Pre-medicate prior to transport.    Lane Hacker, DO Palliative Medicine

## 2013-07-21 NOTE — Progress Notes (Signed)
CSW spoke with Admissions Coordinator [facility] [place] - no beds available at this time. Patient has been placed on waiting list. CSW will update wife and explore return to SNF with hospice.  Samuella BruinKristin Cynethia Schindler, MSW, LCSWA Clinical Social Worker [facility]. [phone]'

## 2013-07-21 NOTE — Progress Notes (Addendum)
CSW spoke with Dois DavenportSandra from [facility] [phone] who states that facility has bed available at this time . To qualify, patient would need to have prognosis of less than 3 weeks to live. CSW will consult with MD for appropriateness of option.  Samuella BruinKristin Hortensia Duffin, MSW, [facility] Clinical Social Worker [phone]

## 2013-07-21 NOTE — Progress Notes (Signed)
[facility] PROGRESS NOTE  [name] HOUSMAN [name]:[id number] DOB: Nov 05, 1934 DOA: 07/15/2013 PCP: Rivka Spring, MD  Assessment/Plan: #1 acute nonhemorrhagic infarct posterior right lenticular nucleus extending into the posterior right coronal radiata Patient with severe dysarthria, dysphagia, and proton motor movement of the left hand. MRI of the head was consistent with an acute CVA. Patient noted to have chronic left internal carotid artery stenosis which was noted on carotid Dopplers. 2-D echo was negative for any source of emboli. Patient was maintained on aspirin suppository secondary to his dysphagia. Was recommended that once patient was tolerating oral intake will likely benefit from being on full dose aspirin and Plavix. Patient will followup with neurology as outpatient.  #2 right lower lobe pneumonia/HCAP/aspiration pneumonia Patient was noted to have a right lower lobe pneumonia. Patient was initially started on his IV cefepime, IV vancomycin and blood cultures were obtained and no growth to date. Due to patient's severe oral dysphagia modified barium swallow was done and patient was kept n.p.o. Patient improved clinically and will be on IV Zosyn until day of discharge.  #3 acute renal failure Questionable etiology. Renal function improving with hydration. Renal ultrasound negative for hydronephrosis. Follow.  #4 Cheyne-Stokes respirations  likely secondary to problem #1. Follow.  #5 hypertension Stable. Hydralazine as needed.  #6 hyperlipidemia Continue Lipitor.   Code Status: DO NOT RESUSCITATE Family Communication: No family at bedside. Disposition Plan: Hospice home when bed available   Consultants:  Palliative care: Dr. Ladona Ridgel 07/17/2013  Neurology: Dr. Amada Jupiter 07/15/2013  Procedures:  2-D echo 07/16/2013  Carotid Dopplers 07/16/2013  CXR 07/18/13, 07/16/2013  Modified barium swallow 07/16/2013  MRI MRA head 07/15/2013  Renal ultrasound  07/19/2013  2-D echo 07/16/2013  Carotid Dopplers 07/16/2013    Antibiotics: Vancomycin 07/16/2013>>>07/18/2013  Zosyn 07/16/13>>>     HPI/Subjective: Patient pleasantly confused. No complaints.  Objective: Filed Vitals:   07/21/13 1048  BP: 131/73  Pulse: 90  Temp: 98.4 F (36.9 C)  Resp: 18    Intake/Output Summary (Last 24 hours) at 07/21/13 1544 Last data filed at 07/21/13 8119  Gross per 24 hour  Intake 1031.25 ml  Output    775 ml  Net 256.25 ml   Filed Weights   07/15/13 2350  Weight: 76.975 kg (169 lb 11.2 oz)    Exam:   General:  NAD. Pleasantly confused.  Cardiovascular: Regular rate rhythm no murmurs rubs or gallops  Respiratory: Clear to auscultation bilaterally.  Abdomen: Soft, nontender, nondistended, positive bowel sounds  Musculoskeletal: No clubbing cyanosis or edema  Data Reviewed: Basic Metabolic Panel:  Recent Labs Lab 07/16/13 0745 07/17/13 0403 07/18/13 0556 07/19/13 0830 07/20/13 0404  NA 144 145 144 144 151*  K 3.5* 3.2* 3.6* 3.4* 3.3*  CL 106 104 104 105 109  CO2 24 23 23 21 23   GLUCOSE 113* 122* 132* 115* 120*  BUN 7 10 22  28* 29*  CREATININE 0.70 0.78 1.98* 2.50* 2.08*  CALCIUM 8.6 9.4 9.3 9.2 9.4  MG  --   --  2.0  --   --    Liver Function Tests:  Recent Labs Lab 07/15/13 1828 07/16/13 0745  AST 18 17  ALT 19 18  ALKPHOS 89 84  BILITOT 0.4 0.6  PROT 7.3 7.0  ALBUMIN 3.4* 3.3*   No results found for this basename: LIPASE, AMYLASE,  in the last 168 hours  Recent Labs Lab 07/16/13 0010  AMMONIA 28   CBC:  Recent Labs Lab 07/15/13 1828 07/16/13 0745 07/17/13  0403 07/18/13 0556 07/19/13 0830 07/20/13 0404  WBC 10.7* 10.6* 14.1* 17.9* 15.3* 17.3*  NEUTROABS 6.9 8.1*  --   --   --   --   HGB 14.6 14.7 16.2 15.6 15.3 15.8  HCT 41.5 42.0 44.7 43.8 43.9 44.8  MCV 88.9 88.4 88.0 87.6 88.7 88.7  PLT 217 200 242 254 252 302   Cardiac Enzymes: No results found for this basename: CKTOTAL,  CKMB, CKMBINDEX, TROPONINI,  in the last 168 hours BNP (last 3 results) No results found for this basename: PROBNP,  in the last 8760 hours CBG:  Recent Labs Lab 07/20/13 0641 07/20/13 1042 07/20/13 1614 07/20/13 2342 07/21/13 1132  GLUCAP 116* 121* 119* 117* 146*    Recent Results (from the past 240 hour(s))  URINE CULTURE     Status: None   Collection Time    07/15/13  6:58 PM      Result Value Ref Range Status   Specimen Description URINE, CLEAN CATCH   Final   Special Requests NONE   Final   Culture  Setup Time     Final   Value: 07/16/2013 00:58     Performed at [facility] Count     Final   Value: 30,000 COLONIES/ML     Performed at [facility]   Culture     Final   Value: Multiple bacterial morphotypes present, none predominant. Suggest appropriate recollection if clinically indicated.     Performed at [facility]   Report Status 07/16/2013 FINAL   Final  CULTURE, BLOOD (ROUTINE X 2)     Status: None   Collection Time    07/15/13  7:03 PM      Result Value Ref Range Status   Specimen Description BLOOD LEFT FOREARM   Final   Special Requests BOTTLES DRAWN AEROBIC ONLY 8CC   Final   Culture  Setup Time     Final   Value: 07/16/2013 00:37     Performed at [facility]   Culture     Final   Value:        BLOOD CULTURE RECEIVED NO GROWTH TO DATE CULTURE WILL BE HELD FOR 5 DAYS BEFORE ISSUING A FINAL NEGATIVE REPORT     Performed at [facility]   Report Status PENDING   Incomplete  CULTURE, BLOOD (ROUTINE X 2)     Status: None   Collection Time    07/15/13  7:23 PM      Result Value Ref Range Status   Specimen Description BLOOD HAND LEFT   Final   Special Requests     Final   Value: BOTTLES DRAWN AEROBIC AND ANAEROBIC 10CCBLUE 8CCRED   Culture  Setup Time     Final   Value: 07/16/2013 00:38     Performed at [facility]   Culture     Final   Value:        BLOOD CULTURE RECEIVED NO GROWTH TO DATE  CULTURE WILL BE HELD FOR 5 DAYS BEFORE ISSUING A FINAL NEGATIVE REPORT     Performed at [facility]   Report Status PENDING   Incomplete     Studies: US Renal  07/20/2013   CLINICAL DATA:  AKI, history of cystitis.  EXAM: RENAL/URINARY TRACT ULTRASOUND COMPLETE  COMPARISON:  CT abdomen pelvis June 13, 2013  FINDINGS: Right Kidney:  Length: 11.1 cm. Echogenicity within normal limits. No hydronephrosis visualized. There is a 9.3  x 7.8 x 2.5 cm cyst in the upper pole right kidney.  Left Kidney:  Length: 12.4 cm. Echogenicity within normal limits. No hydronephrosis visualized. There is a 2.1 x 1.6 x 1.8 cm cyst in the midpole left kidney.  Bladder:  There is debris within the bladder.  IMPRESSION: Simple cysts in bilateral kidneys. No hydronephrosis bilaterally. Debris within the bladder which can be seen in cystitis.   Electronically Signed   By: Sherian ReinWei-Chen  Lin M.D.   On: 07/20/2013 00:54    Scheduled Meds: . aspirin  300 mg Rectal Daily  . enoxaparin (LOVENOX) injection  40 mg Subcutaneous Daily  . pantoprazole (PROTONIX) IV  40 mg Intravenous Q24H  . piperacillin-tazobactam (ZOSYN)  IV  3.375 g Intravenous 3 times per day   Continuous Infusions: . dextrose 5 % 1,000 mL with potassium chloride 20 mEq infusion 75 mL/hr at 07/21/13 1052    Principal Problem:   CVA (cerebral infarction) Active Problems:   HLD (hyperlipidemia)   History of CVA (cerebrovascular accident)   Accelerated hypertension   AKI (acute kidney injury)   HCAP (healthcare-associated pneumonia)   Aspiration into airway   Recurrent falls   Acute ischemic stroke   Aspiration pneumonia   Dysphagia, unspecified(787.20)    Time spent: 30 minutes    [facility],Shanta Hartner MD [facility] Pager [phone]. If 7PM-7AM, please contact night-coverage at [email or web address], password [email or web address] 07/21/2013, 3:44 PM  LOS: 6 days

## 2013-07-21 NOTE — Progress Notes (Signed)
PT Cancellation and Discharge Note  Patient Details Name: [name] [name] MRN: [id number] DOB: 09/18/1934   Cancelled Treatment:    Reason Eval/Treat Not Completed: Patient not medically ready  Noted plan is now for Residential Hospice placement.  Will sign off PT at this time.     Jovian Lembcke, Alison MurrayMegan F 07/21/2013, 7:27 AM

## 2013-07-21 NOTE — Progress Notes (Signed)
Occupational Therapy Discharge Patient Details Name: [name] [name] MRN: [id number] DOB: 09/18/1934 Today's Date: 07/21/2013 Time:  -     Patient discharged from OT services secondary to Pt now for transition to full comfort care.  Please see latest therapy progress note for current level of functioning and progress toward goals.    Progress and discharge plan discussed with patient and/or caregiver: Patient/Caregiver agrees with plan  GO     [name], [name] [phone] 07/21/2013, 10:04 AM

## 2013-07-22 DIAGNOSIS — E785 Hyperlipidemia, unspecified: Secondary | ICD-10-CM

## 2013-07-22 DIAGNOSIS — I10 Essential (primary) hypertension: Secondary | ICD-10-CM

## 2013-07-22 LAB — CULTURE, BLOOD (ROUTINE X 2)
Culture: NO GROWTH
Culture: NO GROWTH

## 2013-07-22 MED ORDER — OXYCODONE HCL 5 MG/5ML PO SOLN: 5.0000 mg | Freq: Every day | ORAL | Status: AC

## 2013-07-22 MED ORDER — ACETAMINOPHEN 650 MG RE SUPP
650.0000 mg | Freq: Four times a day (QID) | RECTAL | Status: DC | PRN
  Administered 2013-07-22: 650 mg via RECTAL
  Filled 2013-07-22: qty 1

## 2013-07-22 MED ORDER — POLYVINYL ALCOHOL 1.4 % OP SOLN: 2.0000 [drp] | OPHTHALMIC | Status: AC | PRN

## 2013-07-22 MED ORDER — BISACODYL 10 MG RE SUPP: 10.0000 mg | Freq: Every day | RECTAL | Status: AC | PRN

## 2013-07-22 MED ORDER — LORAZEPAM 2 MG/ML PO CONC: 0.5000 mg | Freq: Three times a day (TID) | ORAL | Status: AC | PRN

## 2013-07-22 MED ORDER — OXYCODONE HCL 20 MG/ML PO CONC: 5.0000 mg | ORAL | Status: AC | PRN

## 2013-07-22 MED ORDER — ACETAMINOPHEN 325 MG PO TABS: 650.0000 mg | ORAL_TABLET | Freq: Four times a day (QID) | ORAL | Status: DC | PRN

## 2013-07-22 MED ORDER — SCOPOLAMINE 1 MG/3DAYS TD PT72: 1.0000 | MEDICATED_PATCH | TRANSDERMAL | Status: AC

## 2013-07-22 MED ORDER — ACETAMINOPHEN 650 MG RE SUPP: 650.0000 mg | Freq: Four times a day (QID) | RECTAL | Status: AC | PRN

## 2013-07-22 NOTE — Progress Notes (Signed)
D/C orders received. Pt being d/c to [facility]. D/C education not needed. PTAR transporting to Hospice.

## 2013-07-22 NOTE — Clinical Social Work Note (Signed)
CS[facility]. CSW has completed discharge packet and placed on pt's shadow chart. CSW to call for ambulance transportation once speaking to RN.   RN please call report to [facility] at [phone]  Ambulance [phone](PTAR) [phone]  Darlyn ChamberEmily [place], [facility]WA Clinical Social Worker [phone]

## 2013-07-22 NOTE — Progress Notes (Signed)
[facility] PROGRESS NOTE  ANHAD SHEELEY ZOX:[id number] DOB: [birth year] DOA: 07/15/2013 PCP: Rivka Spring, MD  Assessment/Plan: #1 acute nonhemorrhagic infarct posterior right lenticular nucleus extending into the posterior right coronal radiata Patient with severe dysarthria, dysphagia, and proton motor movement of the left hand. MRI of the head was consistent with an acute CVA. Patient noted to have chronic left internal carotid artery stenosis which was noted on carotid Dopplers. 2-D echo was negative for any source of emboli. Patient was maintained on aspirin suppository secondary to his dysphagia. Was recommended that once patient was tolerating oral intake will likely benefit from being on full dose aspirin and Plavix. Patient will followup with neurology as outpatient. Patient has been made full comfort care and as such aspirin was discontinued by palliative care yesterday.  #2 right lower lobe pneumonia/HCAP/aspiration pneumonia Patient was noted to have a right lower lobe pneumonia. Patient was initially started on his IV cefepime, IV vancomycin and blood cultures were obtained and no growth to date. Due to patient's severe oral dysphagia modified barium swallow was done and patient was kept n.p.o. Patient improved clinically and will be on IV Zosyn until day of discharge.  #3 acute renal failure Questionable etiology. Renal function improved with hydration. Renal ultrasound negative for hydronephrosis. Follow.  #4 Cheyne-Stokes respirations  likely secondary to problem #1. Follow.  #5 hypertension Stable. Hydralazine as needed.  #6 hyperlipidemia Continued on Lipitor.  #7 prognosis Patient with a poor prognosis has severe dysphagia secondary to acute stroke and prior strokes was debilitated prior to admission. Patient was seen by palliative care only the patient's family. Patient has a living will stating no feeding tubes and to allow for natural death to care if he has  severe prominent debility or terminal illness. Patient currently with terminal dysphagia unable to tolerate by mouth was and now n.p.o. IV fluids of been discontinued. Pain medication regimen has been outlined per palliative care. Patient is on scopolamine patch upper airway secretions. IV antibiotics were discontinued. Rectal aspirin was also been discontinued. Patient will be transferred to a residential hospice.  There has been no significant change since prior discharge summary done by Dr. Arbutus Leas 07/20/2013. For the rest of the hospital course please refer to such discharge summary.   Code Status: DO NOT RESUSCITATE Family Communication: Discussed with patient's wife at bedside. Disposition Plan: Hospice home today.   Consultants:  Palliative care: Dr. Ladona Ridgel 07/17/2013  Neurology: Dr. Amada Jupiter 07/15/2013  Procedures:  2-D echo 07/16/2013  Carotid Dopplers 07/16/2013  CXR 07/18/13, 07/16/2013  Modified barium swallow 07/16/2013  MRI MRA head 07/15/2013  Renal ultrasound 07/19/2013  2-D echo 07/16/2013  Carotid Dopplers 07/16/2013    Antibiotics: Vancomycin 07/16/2013>>>07/18/2013  Zosyn 07/16/13>>>07/21/13     HPI/Subjective: Patient sleeping comfortably. No complaints.  Objective: Filed Vitals:   07/22/13 1033  BP: 173/100  Pulse: 80  Temp: 99.4 F (37.4 C)  Resp:     Intake/Output Summary (Last 24 hours) at 07/22/13 1203 Last data filed at 07/22/13 0554  Gross per 24 hour  Intake    610 ml  Output   1450 ml  Net   -840 ml   Filed Weights   07/15/13 2350  Weight: 76.975 kg (169 lb 11.2 oz)    Exam:   General:  NAD. Pleasantly confused.  Cardiovascular: Regular rate rhythm no murmurs rubs or gallops  Respiratory: Clear to auscultation bilaterally.  Abdomen: Soft, nontender, nondistended, positive bowel sounds  Musculoskeletal: No clubbing cyanosis or edema  Data Reviewed: Basic Metabolic Panel:  Recent Labs Lab 07/16/13 0745  07/17/13 0403 07/18/13 0556 07/19/13 0830 07/20/13 0404  NA 144 145 144 144 151*  K 3.5* 3.2* 3.6* 3.4* 3.3*  CL 106 104 104 105 109  CO2 24 23 23 21 23   GLUCOSE 113* 122* 132* 115* 120*  BUN 7 10 22  28* 29*  CREATININE 0.70 0.78 1.98* 2.50* 2.08*  CALCIUM 8.6 9.4 9.3 9.2 9.4  MG  --   --  2.0  --   --    Liver Function Tests:  Recent Labs Lab 07/15/13 1828 07/16/13 0745  AST 18 17  ALT 19 18  ALKPHOS 89 84  BILITOT 0.4 0.6  PROT 7.3 7.0  ALBUMIN 3.4* 3.3*   No results found for this basename: LIPASE, AMYLASE,  in the last 168 hours  Recent Labs Lab 07/16/13 0010  AMMONIA 28   CBC:  Recent Labs Lab 07/15/13 1828 07/16/13 0745 07/17/13 0403 07/18/13 0556 07/19/13 0830 07/20/13 0404  WBC 10.7* 10.6* 14.1* 17.9* 15.3* 17.3*  NEUTROABS 6.9 8.1*  --   --   --   --   HGB 14.6 14.7 16.2 15.6 15.3 15.8  HCT 41.5 42.0 44.7 43.8 43.9 44.8  MCV 88.9 88.4 88.0 87.6 88.7 88.7  PLT 217 200 242 254 252 302   Cardiac Enzymes: No results found for this basename: CKTOTAL, CKMB, CKMBINDEX, TROPONINI,  in the last 168 hours BNP (last 3 results) No results found for this basename: PROBNP,  in the last 8760 hours CBG:  Recent Labs Lab 07/20/13 1042 07/20/13 1614 07/20/13 2342 07/21/13 1132 07/21/13 1625  GLUCAP 121* 119* 117* 146* 128*    Recent Results (from the past 240 hour(s))  URINE CULTURE     Status: None   Collection Time    07/15/13  6:58 PM      Result Value Ref Range Status   Specimen Description URINE, CLEAN CATCH   Final   Special Requests NONE   Final   Culture  Setup Time     Final   Value: 07/16/2013 00:58     [facility]formed [facility]   Colony Count     Final   Value: 30,000 COLONIES/ML     [facility]formed at [facility]   Culture     Final   Value: Multiple bacterial morphotypes present, none predominant. Suggest appropriate recollection if clinically indicated.     [facility]formed at [facility]   Report Status 07/16/2013  FINAL   Final  CULTURE, BLOOD (ROUTINE X 2)     Status: None   Collection Time    07/15/13  7:03 PM      Result Value Ref Range Status   Specimen Description BLOOD LEFT FOREARM   Final   Special Requests BOTTLES DRAWN AEROBIC ONLY 8CC   Final   Culture  Setup Time     Final   Value: 07/16/2013 00:37     [facility]formed at [facility]   Culture     Final   Value: NO GROWTH 5 DAYS     [facility]formed at [facility]   Report Status 07/22/2013 FINAL   Final  CULTURE, BLOOD (ROUTINE X 2)     Status: None   Collection Time    07/15/13  7:23 PM      Result Value Ref Range Status   Specimen Description BLOOD HAND LEFT   Final   Special Requests     Final  Value: BOTTLES DRAWN AEROBIC AND ANAEROBIC 10CCBLUE 8CCRED   Culture  Setup Time     Final   Value: 07/16/2013 00:38     Performed at [facility]   Culture     Final   Value: NO GROWTH 5 DAYS     Performed at [facility]   Report Status 07/22/2013 FINAL   Final     Studies: No results found.  Scheduled Meds: . oxyCODONE  5 mg Oral QHS  . scopolamine  1 patch Transdermal Q72H   Continuous Infusions:    Principal Problem:   CVA (cerebral infarction) Active Problems:   HLD (hyperlipidemia)   History of CVA (cerebrovascular accident)   Accelerated hypertension   AKI (acute kidney injury)   HCAP (healthcare-associated pneumonia)   Aspiration into airway   Recurrent falls   Acute ischemic stroke   Aspiration pneumonia   Dysphagia, unspecified(787.20)    Time spent: 30 minutes    [facility] MD [facility] Pager [phone]. If 7PM-7AM, please contact night-coverage at [email or web address], password [facility] 07/22/2013, 12:03 PM  LOS: 7 days

## 2013-07-27 ENCOUNTER — Other Ambulatory Visit: Payer: Self-pay | Admitting: Vascular Surgery

## 2013-07-27 DIAGNOSIS — I6529 Occlusion and stenosis of unspecified carotid artery: Secondary | ICD-10-CM

## 2013-08-06 ENCOUNTER — Telehealth: Payer: Self-pay

## 2013-08-06 NOTE — Telephone Encounter (Signed)
Patient past away per Obituary in [place] [employer] °

## 2013-08-08 NOTE — Progress Notes (Signed)
Clinical social worker assisted with [facility].  CSW addressed all family questions and concerns. CSW copied chart and added all important documents. CSW also set up patient transportation with [facility]. Clinical Social Worker will sign off for now as social work intervention is no longer needed.   Sharilyn Geisinger, MSW, LCSWA [phone] 

## 2013-08-08 NOTE — Clinical Social Work Psychosocial (Addendum)
Clinical Social Work Department  BRIEF PSYCHOSOCIAL ASSESSMENT  06/17/13 Patient: [name] [name] [name], [name] [name] Account Number: [id number] Admit date: 06/13/13 Clinical Social Worker: Rhea Pink, MSW, [place] Date/Time: 06/17/13 11:00 AM  Referred by: Physician Date Referred:06/17/13 Referred for   SNF Placement   Other Referral:  Interview type: Family  Other interview type:  Patient's wife interviewed at bedside as patient cannot contribute to assessment.   PSYCHOSOCIAL DATA  Living Status: WIFE  Admitted from facility:   Level of care:  Primary support name: [name] [name] [name] [name] [name]  Primary support relationship to patient: SPOUSE  Degree of support available:  Support is good.   CURRENT CONCERNS  Current Concerns   Post-Acute Placement   Other Concerns:  SOCIAL WORK ASSESSMENT / PLAN  CSW met with wife at bedside to discuss recommendation for SNF. PT is recommending SNF. Wife is agreeable to ST- SNF for patient to get stronger prior to returning home. Patient's wife reports that [facility] would be her first choice due to location. CSW asked if the wife had any more questions or concerns. She was appreciative and had no further questions  Assessment/plan status: Psychosocial Support/Ongoing Assessment of Needs  Other assessment/ plan:  Complete FL2, Fax   Information/referral to community resources:  PATIENT'S/FAMILY'S RESPONSE TO PLAN OF CARE:  Patient's wife was very Patent attorney and thanked CSW. Patient's wife expressed that she wished the patient could have come home but also expressed that she understood that the patient would need further therapy before returning home.

## 2013-08-08 DEATH — deceased

## 2013-12-28 ENCOUNTER — Ambulatory Visit: Payer: Medicare Other | Admitting: Family

## 2013-12-28 ENCOUNTER — Other Ambulatory Visit (HOSPITAL_COMMUNITY): Payer: Medicare Other

## 2014-01-03 ENCOUNTER — Ambulatory Visit: Payer: Medicare Other | Admitting: Neurology

## 2015-12-07 IMAGING — CT CT ABD-PELV W/ CM
2 of 5 series · 14 of 46 positions shown, 16 images · IV contrast (APPLIED)
Comparison: CT ABD W/CM dated 08/07/2004; CT ABD W/CM dated 01/14/2003

CLINICAL DATA: Pain at the penis and testicle while urinating.
History of frequent urinary tract infections. Abdominal aortic
aneurysm. Prior liver biopsy.

EXAM:
CT ABDOMEN AND PELVIS WITH CONTRAST
TECHNIQUE: Multidetector CT imaging of the abdomen and pelvis was performed
using the standard protocol following bolus administration of
intravenous contrast.
CONTRAST:  100mL OMNIPAQUE IOHEXOL 300 MG/ML  SOLN

[Series 2: abd/ pelvis 5.0 i30f 1 · axial · 0.89mm/px · z∈[-1508,-1033]mm · 11 of 107 slices shown, 13 images]
[im 6/107  soft-tissue]
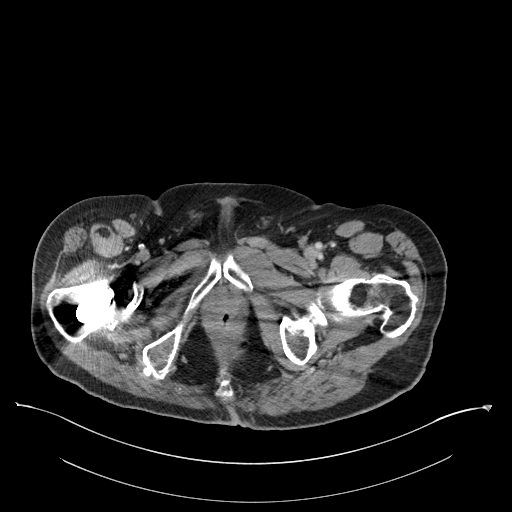
[im 6/107  bone]
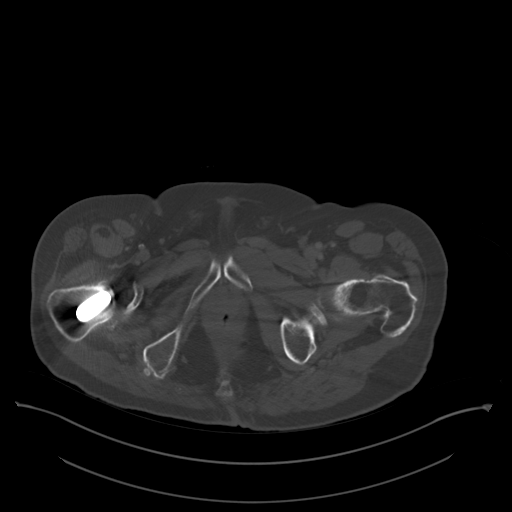
[im 16/107  soft-tissue]
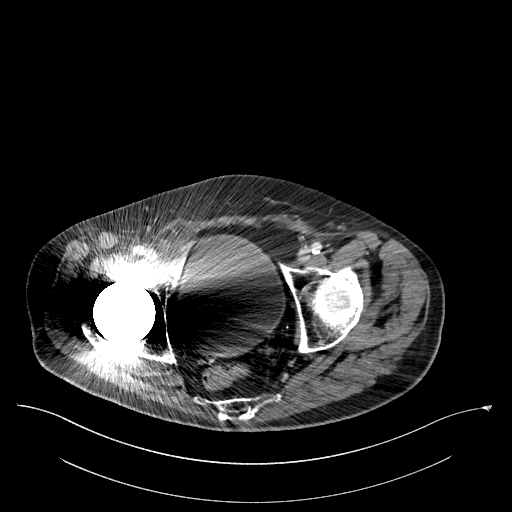
[im 27/107  soft-tissue]
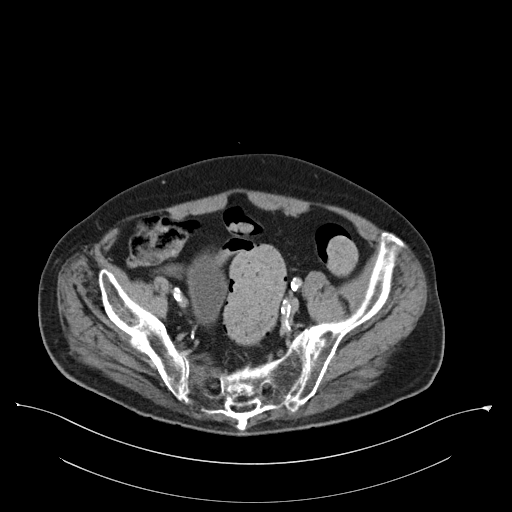
[im 38/107  soft-tissue]
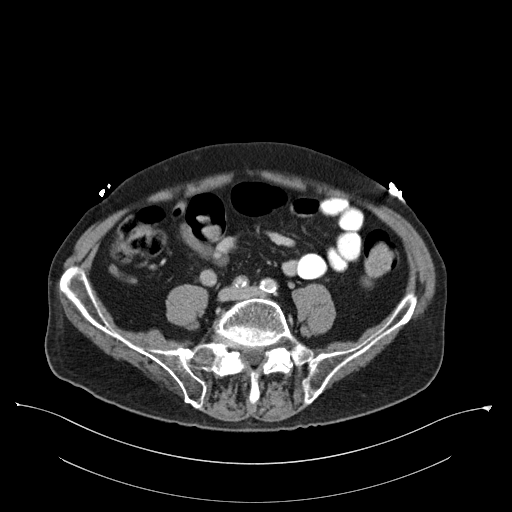
[im 43/107  soft-tissue]
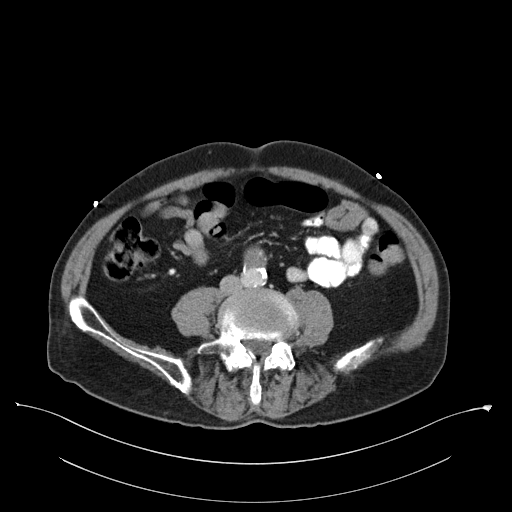
[im 54/107  soft-tissue]
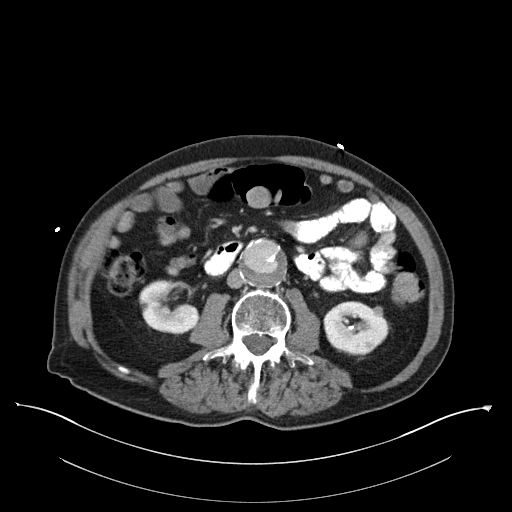
[im 64/107  soft-tissue]
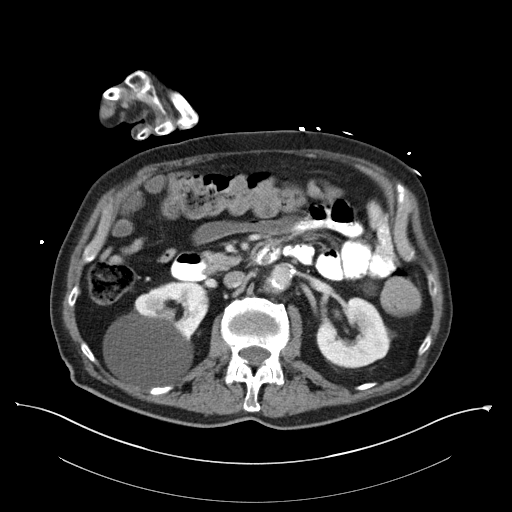
[im 69/107  soft-tissue]
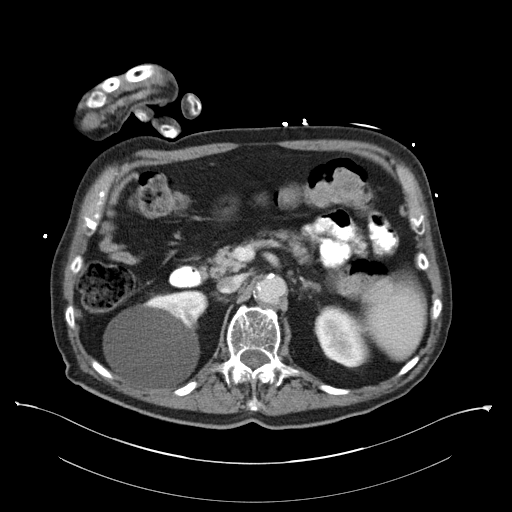
[im 80/107  soft-tissue]
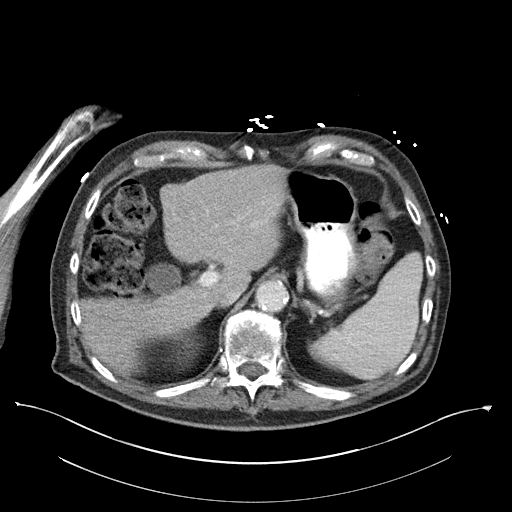
[im 80/107  bone]
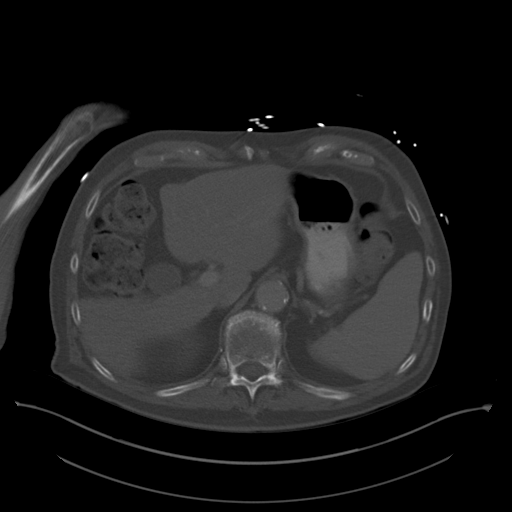
[im 91/107  soft-tissue]
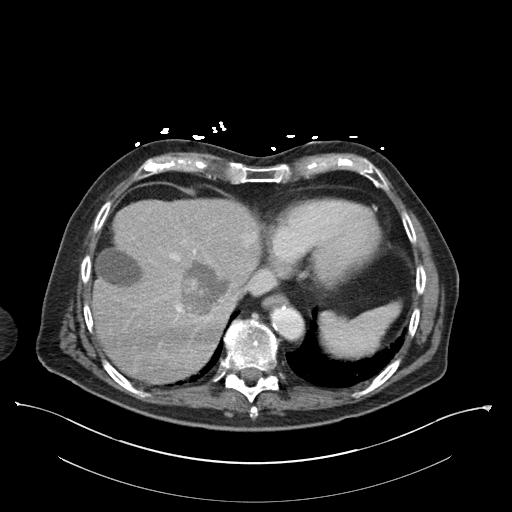
[im 101/107  soft-tissue]
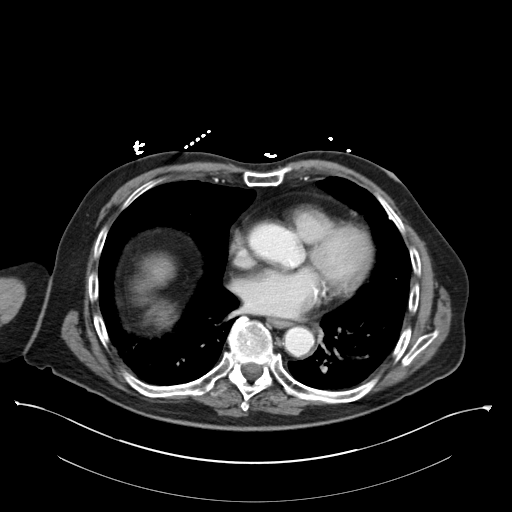

[Series 5: cor · coronal · 0.70mm/px · 3 of 124 slices shown]
[im 42/124  soft-tissue]
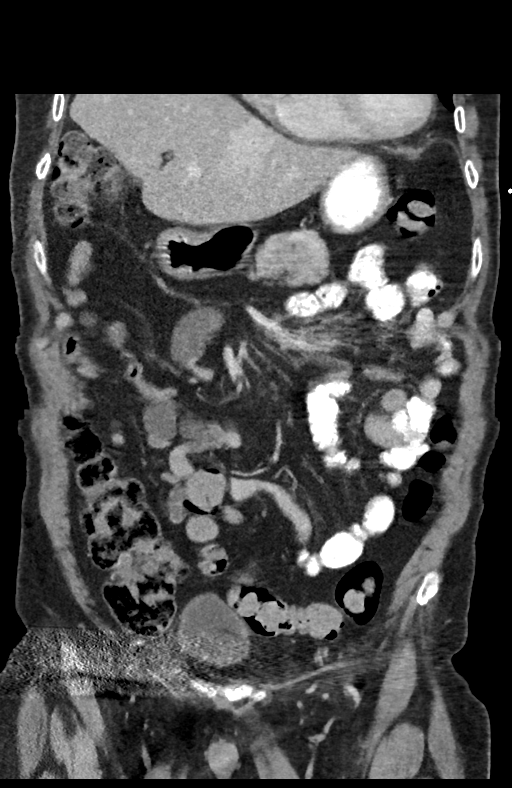
[im 55/124  soft-tissue]
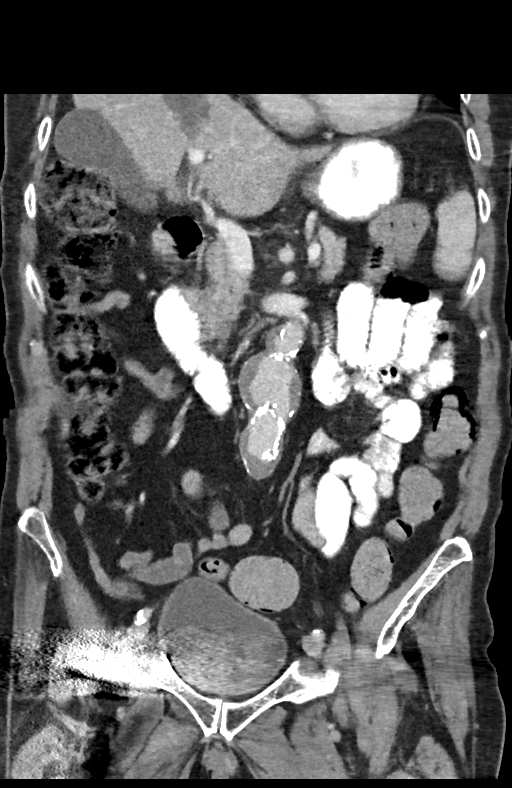
[im 69/124  soft-tissue]
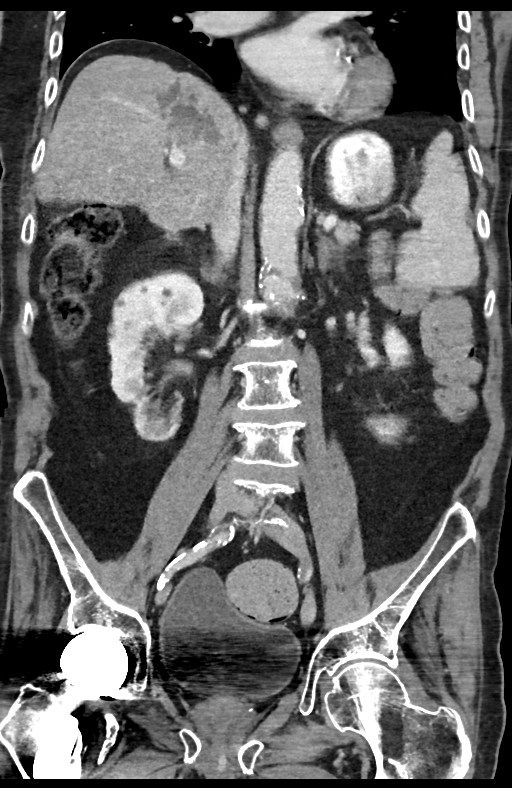

[14 of 46 positions shown; findings below may reference images not displayed]

FINDINGS: Lower Chest: Mild bibasilar volume loss. Mild cardiomegaly with
atherosclerosis in the coronary arteries. No pericardial or pleural
effusion.

Abdomen/Pelvis: Irregular hepatic contour, most consistent with
cirrhosis. Underline probable hepatic steatosis. Heterogeneous
enhancement in the left lobe of the liver on image 22/series 2 could
represent heterogeneous steatosis or altered perfusion. There is a
hepatic dome irregular peripherally enhancing lesion which measures
5.0 x 4.8 cm on image 15/ series 2. This is new since the prior
exam. Portal veins patent. Normal spleen, stomach, pancreas.

Mild motion degradation. Normal gallbladder, biliary tract, right
adrenal gland. Mild left adrenal thickening and nodularity is
chronic.

Scarring in the lower pole right kidney with a dominant 8.2 cm
inter/lower pole right renal cyst. Left renal cyst and bilateral too
small to characterize renal lesions. A lower pole left renal lesion
measures 8 mm and greater than fluid density on image 54/ series 2.
This is at the site of a larger lower density lesion on the prior
exam.

Punctate lower pole left renal collecting system calculus. No
hydronephrosis or hydroureter.

Aortic and branch vessel atherosclerosis. Infrarenal abdominal
aortic aneurysm at maximally 4.3 cm today. 3.5 cm at the same level
on the prior. A component of chronic dissection is identified in
this area.

More focal wall saccular aneurysm is seen just above the aortic
bifurcation at 2.5 cm on image 64. This is increased from 1.1 cm at
the same level on the prior. No surrounding hemorrhage or adenopathy
in the retroperitoneum.

Degraded evaluation of the pelvis, secondary to right hip
arthroplasty and resultant beam hardening artifact.

Colonic stool burden suggests constipation. Normal terminal ileum
and appendix. Normal small bowel without abdominal ascites.

Atherosclerosis but no pelvic aneurysm. No pelvic adenopathy. Fat
containing right inguinal hernia.

Normal prostate. Foci of increased density within the dependent
urinary bladder on image 89 are suspicious for small stones. No
significant free fluid. Hyperattenuation in the penile base on image
106/series 2 is not calcified and is favored to be vascular.

Bones/Musculoskeletal: Right hip arthroplasty. Osteopenia. Moderate
L2 and mild L4 compression deformities.
IMPRESSION: 1. Hyperattenuation in the dependent bladder is favored to represent
small stones. Early contrast excretion felt less likely.
2. Motion and hardware degraded exam.
3. Left nephrolithiasis.
4. Irregular hepatic contour, suspicious for cirrhosis. New hepatic
dome lesion which demonstrates peripheral enhancement. Although this
could represent a hemangioma, given cirrhosis, hepatocellular
carcinoma is a concern. Nonemergent outpatient pre and post contrast
abdominal MRI should be considered.
5. Progression of abdominal aortic aneurysm with saccular and
fusiform components. No surrounding hemorrhage.
6.  Possible constipation.
7. Hyperattenuation at the penile base is favored to be vascular.
Partially calcified urethral stone is felt less likely.
8. Left renal lesion which is not a simple cyst. Recommend attention
on followup MRI.

## 2016-01-11 IMAGING — CR DG CHEST 1V PORT
1 series · 1 of 1 positions shown · non-contrast
Comparison: 07/16/2013

CLINICAL DATA: Aspiration pneumonia.  Elevated white count.

EXAM:
PORTABLE CHEST - 1 VIEW

[AP]
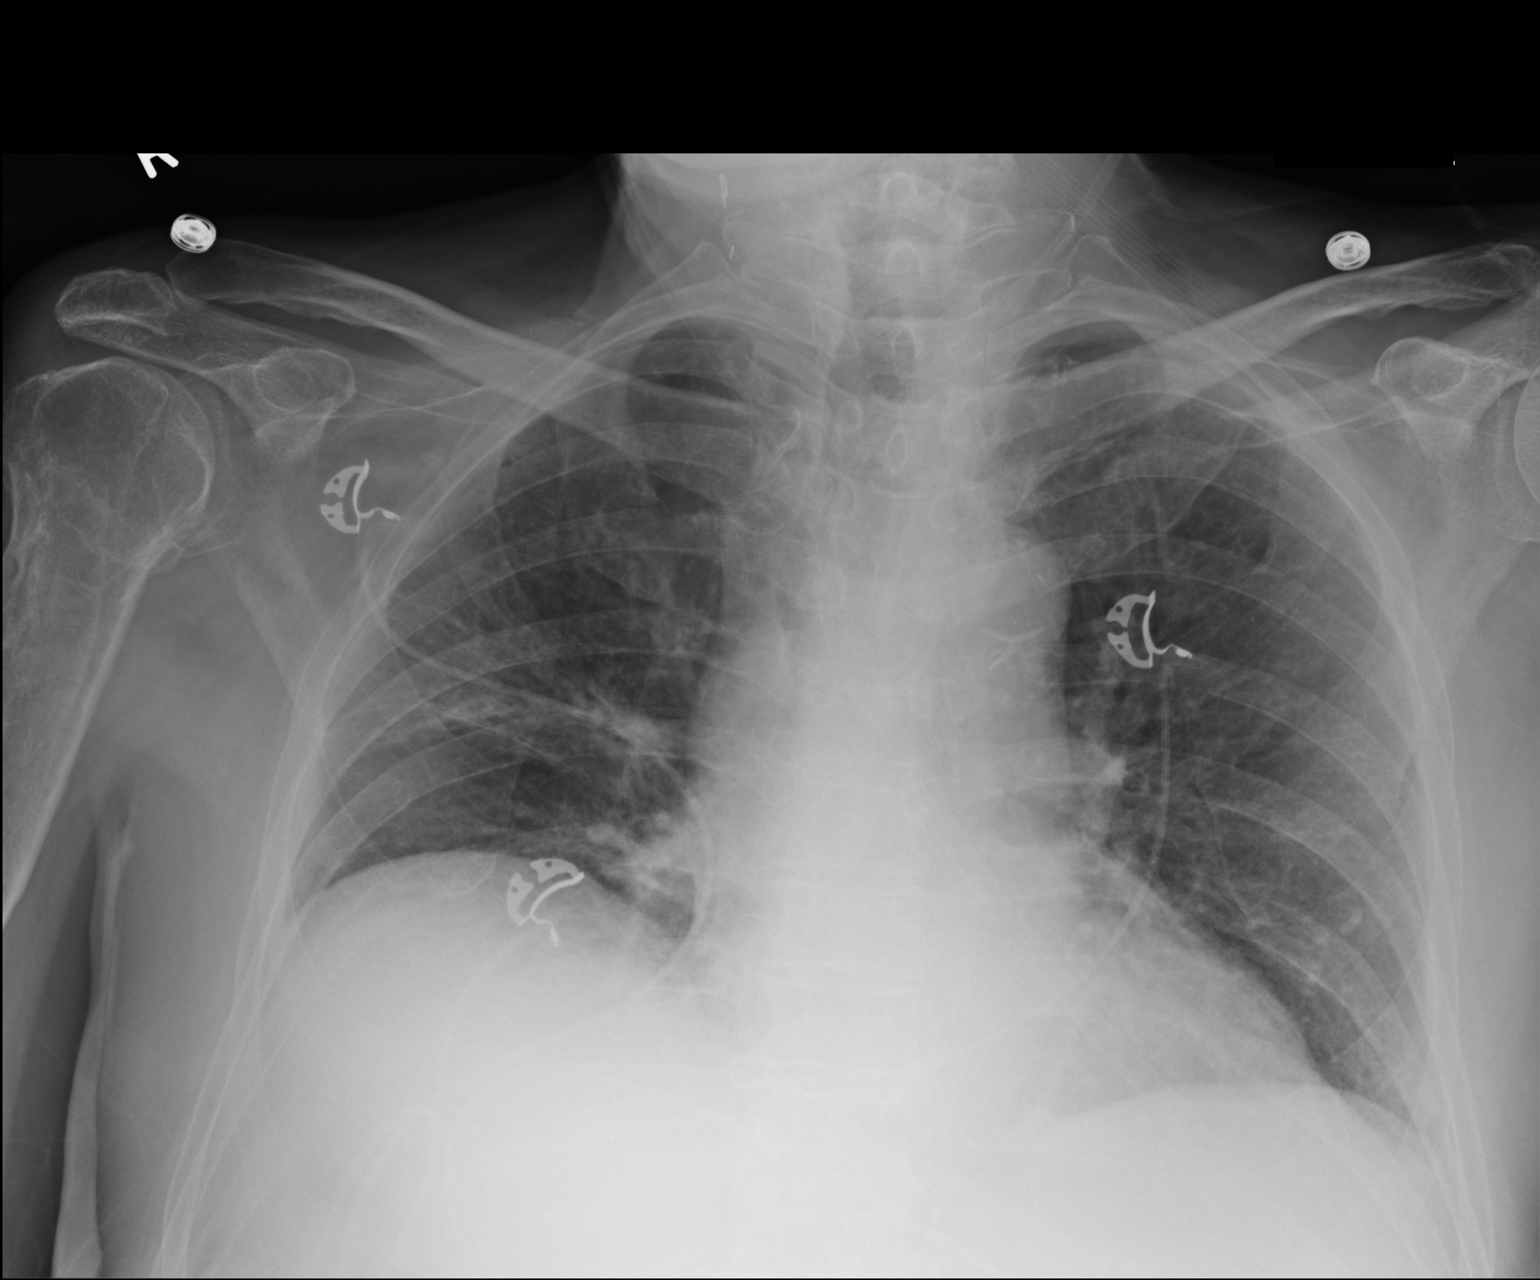

[1 of 1 positions shown; findings below may reference images not displayed]

FINDINGS: Low lung volumes. Right base atelectasis. Heart is borderline in
size. No effusions. No edema. No acute bony abnormality.
IMPRESSION: Low lung volumes.  Right base atelectasis.
